# Patient Record
Sex: Female | Born: 1960 | Race: White | Hispanic: No | Smoking: Former smoker
Health system: Southern US, Community
[De-identification: ages and names within clinical notes are randomized; demographics above are authoritative.]

## PROBLEM LIST (undated history)

## (undated) DIAGNOSIS — N200 Calculus of kidney: Secondary | ICD-10-CM

## (undated) DIAGNOSIS — A630 Anogenital (venereal) warts: Secondary | ICD-10-CM

## (undated) DIAGNOSIS — R945 Abnormal results of liver function studies: Secondary | ICD-10-CM

## (undated) DIAGNOSIS — A419 Sepsis, unspecified organism: Secondary | ICD-10-CM

## (undated) DIAGNOSIS — F32A Depression, unspecified: Secondary | ICD-10-CM

## (undated) DIAGNOSIS — J90 Pleural effusion, not elsewhere classified: Secondary | ICD-10-CM

## (undated) DIAGNOSIS — Z87892 Personal history of anaphylaxis: Secondary | ICD-10-CM

## (undated) DIAGNOSIS — N1330 Unspecified hydronephrosis: Secondary | ICD-10-CM

## (undated) DIAGNOSIS — F329 Major depressive disorder, single episode, unspecified: Secondary | ICD-10-CM

## (undated) DIAGNOSIS — R011 Cardiac murmur, unspecified: Secondary | ICD-10-CM

## (undated) DIAGNOSIS — D649 Anemia, unspecified: Secondary | ICD-10-CM

## (undated) HISTORY — DX: Sepsis, unspecified organism: A41.9

## (undated) HISTORY — DX: Cardiac murmur, unspecified: R01.1

## (undated) HISTORY — DX: Depression, unspecified: F32.A

## (undated) HISTORY — DX: Anogenital (venereal) warts: A63.0

## (undated) HISTORY — DX: Major depressive disorder, single episode, unspecified: F32.9

---
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
## 1998-05-20 ENCOUNTER — Other Ambulatory Visit: Admission: RE | Admit: 1998-05-20 | Discharge: 1998-05-20 | Payer: Self-pay | Admitting: Obstetrics and Gynecology

## 1999-05-11 ENCOUNTER — Other Ambulatory Visit: Admission: RE | Admit: 1999-05-11 | Discharge: 1999-05-11 | Payer: Self-pay | Admitting: Obstetrics and Gynecology

## 2000-09-09 ENCOUNTER — Other Ambulatory Visit: Admission: RE | Admit: 2000-09-09 | Discharge: 2000-09-09 | Payer: Self-pay | Admitting: Obstetrics and Gynecology

## 2001-10-13 ENCOUNTER — Other Ambulatory Visit: Admission: RE | Admit: 2001-10-13 | Discharge: 2001-10-13 | Payer: Self-pay | Admitting: Obstetrics and Gynecology

## 2003-01-08 ENCOUNTER — Other Ambulatory Visit: Admission: RE | Admit: 2003-01-08 | Discharge: 2003-01-08 | Payer: Self-pay | Admitting: Obstetrics and Gynecology

## 2003-07-27 ENCOUNTER — Other Ambulatory Visit: Admission: RE | Admit: 2003-07-27 | Discharge: 2003-07-27 | Payer: Self-pay | Admitting: Obstetrics and Gynecology

## 2003-07-27 ENCOUNTER — Other Ambulatory Visit: Admission: RE | Admit: 2003-07-27 | Discharge: 2003-07-27 | Payer: Self-pay | Admitting: *Deleted

## 2004-01-27 ENCOUNTER — Other Ambulatory Visit: Admission: RE | Admit: 2004-01-27 | Discharge: 2004-01-27 | Payer: Self-pay | Admitting: Obstetrics and Gynecology

## 2004-03-22 ENCOUNTER — Ambulatory Visit (HOSPITAL_COMMUNITY): Admission: RE | Admit: 2004-03-22 | Discharge: 2004-03-22 | Payer: Self-pay | Admitting: Obstetrics and Gynecology

## 2011-07-26 ENCOUNTER — Other Ambulatory Visit: Payer: Self-pay | Admitting: Dermatology

## 2012-01-08 LAB — LIPID PANEL
Cholesterol: 241 — AB (ref 0–200)
HDL: 47 (ref 35–70)
LDL Cholesterol: 180
Triglycerides: 101 (ref 40–160)

## 2012-02-08 ENCOUNTER — Other Ambulatory Visit: Payer: Self-pay | Admitting: Gastroenterology

## 2014-10-13 ENCOUNTER — Encounter: Payer: Self-pay | Admitting: *Deleted

## 2017-11-21 ENCOUNTER — Emergency Department (HOSPITAL_COMMUNITY): Payer: BLUE CROSS/BLUE SHIELD

## 2017-11-21 ENCOUNTER — Emergency Department (HOSPITAL_COMMUNITY)
Admission: EM | Admit: 2017-11-21 | Discharge: 2017-11-21 | Disposition: A | Discharge status: Home / Self Care | Payer: BLUE CROSS/BLUE SHIELD | Attending: Emergency Medicine | Admitting: Emergency Medicine

## 2017-11-21 ENCOUNTER — Encounter (HOSPITAL_COMMUNITY): Payer: Self-pay | Admitting: Emergency Medicine

## 2017-11-21 DIAGNOSIS — N132 Hydronephrosis with renal and ureteral calculous obstruction: Secondary | ICD-10-CM | POA: Insufficient documentation

## 2017-11-21 DIAGNOSIS — N3001 Acute cystitis with hematuria: Secondary | ICD-10-CM | POA: Diagnosis not present

## 2017-11-21 DIAGNOSIS — R109 Unspecified abdominal pain: Secondary | ICD-10-CM | POA: Diagnosis present

## 2017-11-21 DIAGNOSIS — F17200 Nicotine dependence, unspecified, uncomplicated: Secondary | ICD-10-CM | POA: Insufficient documentation

## 2017-11-21 DIAGNOSIS — N200 Calculus of kidney: Secondary | ICD-10-CM

## 2017-11-21 HISTORY — DX: Calculus of kidney: N20.0

## 2017-11-21 LAB — URINALYSIS, ROUTINE W REFLEX MICROSCOPIC
BILIRUBIN URINE: NEGATIVE
Glucose, UA: NEGATIVE mg/dL
KETONES UR: 20 mg/dL — AB
Nitrite: POSITIVE — AB
Protein, ur: 30 mg/dL — AB
Specific Gravity, Urine: 1.018 (ref 1.005–1.030)
pH: 7 (ref 5.0–8.0)

## 2017-11-21 LAB — I-STAT CHEM 8, ED
BUN: 12 mg/dL (ref 6–20)
Calcium, Ion: 1.13 mmol/L — ABNORMAL LOW (ref 1.15–1.40)
Chloride: 105 mmol/L (ref 101–111)
Creatinine, Ser: 0.6 mg/dL (ref 0.44–1.00)
Glucose, Bld: 132 mg/dL — ABNORMAL HIGH (ref 65–99)
HEMATOCRIT: 42 % (ref 36.0–46.0)
HEMOGLOBIN: 14.3 g/dL (ref 12.0–15.0)
POTASSIUM: 3.7 mmol/L (ref 3.5–5.1)
SODIUM: 140 mmol/L (ref 135–145)
TCO2: 26 mmol/L (ref 22–32)

## 2017-11-21 LAB — CBC WITH DIFFERENTIAL/PLATELET
BASOS PCT: 0 %
Basophils Absolute: 0 10*3/uL (ref 0.0–0.1)
EOS ABS: 0 10*3/uL (ref 0.0–0.7)
Eosinophils Relative: 0 %
HCT: 40.7 % (ref 36.0–46.0)
HEMOGLOBIN: 14.1 g/dL (ref 12.0–15.0)
Lymphocytes Relative: 6 %
Lymphs Abs: 1 10*3/uL (ref 0.7–4.0)
MCH: 31.1 pg (ref 26.0–34.0)
MCHC: 34.6 g/dL (ref 30.0–36.0)
MCV: 89.8 fL (ref 78.0–100.0)
Monocytes Absolute: 0.2 10*3/uL (ref 0.1–1.0)
Monocytes Relative: 1 %
NEUTROS PCT: 93 %
Neutro Abs: 15.5 10*3/uL — ABNORMAL HIGH (ref 1.7–7.7)
PLATELETS: 287 10*3/uL (ref 150–400)
RBC: 4.53 MIL/uL (ref 3.87–5.11)
RDW: 13.6 % (ref 11.5–15.5)
WBC: 16.8 10*3/uL — AB (ref 4.0–10.5)

## 2017-11-21 MED ORDER — HYDROMORPHONE HCL 1 MG/ML IJ SOLN
1.0000 mg | Freq: Once | INTRAMUSCULAR | Status: AC
  Administered 2017-11-21: 1 mg via INTRAVENOUS
  Filled 2017-11-21: qty 1

## 2017-11-21 MED ORDER — METOCLOPRAMIDE HCL 5 MG/ML IJ SOLN
10.0000 mg | Freq: Once | INTRAMUSCULAR | Status: AC
  Administered 2017-11-21: 10 mg via INTRAVENOUS
  Filled 2017-11-21: qty 2

## 2017-11-21 MED ORDER — ONDANSETRON HCL 4 MG/2ML IJ SOLN
4.0000 mg | Freq: Once | INTRAMUSCULAR | Status: AC
Start: 2017-11-21 — End: 2017-11-21
  Administered 2017-11-21: 4 mg via INTRAVENOUS
  Filled 2017-11-21: qty 2

## 2017-11-21 MED ORDER — DEXTROSE 5 % IV SOLN
1.0000 g | Freq: Once | INTRAVENOUS | Status: AC
  Administered 2017-11-21: 1 g via INTRAVENOUS
  Filled 2017-11-21: qty 10

## 2017-11-21 MED ORDER — ONDANSETRON HCL 4 MG PO TABS: 4.0000 mg | ORAL_TABLET | Freq: Four times a day (QID) | ORAL | 0 refills | Status: DC

## 2017-11-21 MED ORDER — HYDROCODONE-ACETAMINOPHEN 5-325 MG PO TABS
1.0000 | ORAL_TABLET | Freq: Once | ORAL | Status: AC
  Administered 2017-11-21: 1 via ORAL
  Filled 2017-11-21: qty 1

## 2017-11-21 MED ORDER — HYDROCODONE-ACETAMINOPHEN 5-325 MG PO TABS: 2.0000 | ORAL_TABLET | ORAL | 0 refills | Status: DC | PRN

## 2017-11-21 MED ORDER — SODIUM CHLORIDE 0.9 % IV BOLUS (SEPSIS)
1000.0000 mL | Freq: Once | INTRAVENOUS | Status: AC
  Administered 2017-11-21: 1000 mL via INTRAVENOUS

## 2017-11-21 MED ORDER — CEPHALEXIN 500 MG PO CAPS: 500.0000 mg | ORAL_CAPSULE | Freq: Two times a day (BID) | ORAL | 0 refills | Status: DC

## 2017-11-21 MED ORDER — HYDROMORPHONE HCL 1 MG/ML IJ SOLN
0.5000 mg | Freq: Once | INTRAMUSCULAR | Status: AC
  Administered 2017-11-21: 0.5 mg via INTRAVENOUS
  Filled 2017-11-21: qty 1

## 2017-11-21 NOTE — ED Provider Notes (Signed)
[facility]   CSN: [id number] Arrival date & time: 11/21/17  1029     History   Chief Complaint Chief Complaint  Patient presents with  . Flank Pain    HPI Sarah Fry is a 56 y.o. female.  The history is provided by the patient.  Flank Pain  This is a new problem. The current episode started 3 to 5 hours ago. The problem occurs constantly. The problem has been rapidly worsening. Associated symptoms comments: Severe searing 10 out of 10 pain that started as a dull ache in the left side of her back and moved around to her left lower abdomen.  Nausea without vomiting.  No fever.  Patient felt fine this morning before the pain started.. Nothing aggravates the symptoms. Nothing relieves the symptoms. Treatments tried: 4 ibuprofen. The treatment provided no relief.    Past Medical History:  Diagnosis Date  . Kidney stones     There are no active problems to display for this patient.   History reviewed. No pertinent surgical history.  OB History    No data available       Home Medications    Prior to Admission medications   Not on File    Family History History reviewed. No pertinent family history.  Social History Social History   Tobacco Use  . Smoking status: Current Every Day Smoker  Substance Use Topics  . Alcohol use: No  . Drug use: No     Allergies   Patient has no known allergies.   Review of Systems Review of Systems  Genitourinary: Positive for flank pain.  All other systems reviewed and are negative.    Physical Exam Updated Vital Signs BP 135/87 (BP Location: Right Arm)   Pulse 83   Temp 98 F (36.7 C)   Resp 17   SpO2 98%   Physical Exam  Constitutional: She is oriented to person, place, and time. She appears well-developed and well-nourished. She appears distressed.  Appears very uncomfortable, curled in the fetal position on the bed and moaning  HENT:  Head:  Normocephalic and atraumatic.  Mouth/Throat: Oropharynx is clear and moist.  Eyes: Conjunctivae and EOM are normal. Pupils are equal, round, and reactive to light.  Neck: Normal range of motion. Neck supple.  Cardiovascular: Normal rate, regular rhythm and intact distal pulses.  No murmur heard. Pulmonary/Chest: Effort normal and breath sounds normal. No respiratory distress. She has no wheezes. She has no rales.  Abdominal: Soft. She exhibits no distension. There is tenderness in the left lower quadrant. There is CVA tenderness. There is no rebound and no guarding.  Musculoskeletal: Normal range of motion. She exhibits no edema or tenderness.  Neurological: She is alert and oriented to person, place, and time.  Skin: Skin is warm and dry. No rash noted. No erythema.  Psychiatric: She has a normal mood and affect. Her behavior is normal.  Nursing note and vitals reviewed.    ED Treatments / Results  Labs (all labs ordered are listed, but only abnormal results are displayed) Labs Reviewed  URINALYSIS, ROUTINE W REFLEX MICROSCOPIC - Abnormal; Notable for the following components:      Result Value   Color, Urine AMBER (*)    APPearance CLOUDY (*)    Hgb urine dipstick LARGE (*)    Ketones, ur 20 (*)    Protein, ur 30 (*)    Nitrite POSITIVE (*)    Leukocytes, UA SMALL (*)  Bacteria, UA FEW (*)    Squamous Epithelial / LPF 0-5 (*)    All other components within normal limits  CBC WITH DIFFERENTIAL/PLATELET - Abnormal; Notable for the following components:   WBC 16.8 (*)    Neutro Abs 15.5 (*)    All other components within normal limits  I-STAT CHEM 8, ED - Abnormal; Notable for the following components:   Glucose, Bld 132 (*)    Calcium, Ion 1.13 (*)    All other components within normal limits  URINE CULTURE    EKG  EKG Interpretation None       Radiology Ct Renal Stone Study  Result Date: 11/21/2017 CLINICAL DATA:  Left flank pain radiating to left lower  quadrant since this morning. History of kidney stones. EXAM: CT ABDOMEN AND PELVIS WITHOUT CONTRAST TECHNIQUE: Multidetector CT imaging of the abdomen and pelvis was performed following the standard protocol without IV contrast. COMPARISON:  CT abdomen pelvis dated March 23, 2011. FINDINGS: Lower chest: No acute abnormality. Hepatobiliary: No focal liver abnormality is seen. No gallstones, gallbladder wall thickening, or biliary dilatation. Pancreas: Unremarkable. No pancreatic ductal dilatation or surrounding inflammatory changes. Spleen: Normal in size without focal abnormality. Adrenals/Urinary Tract: The adrenal glands are unremarkable. There is a 4 mm calculus in the distal left ureter, approximately 1.5 cm from the UVJ. There is resultant moderate left hydroureteronephrosis. There are additional nonobstructive calculi in both kidneys. No right hydronephrosis. Simple cyst in the lower pole of the right kidney. The bladder is decompressed. Stomach/Bowel: Stomach is within normal limits. Appendix appears normal. No evidence of bowel wall thickening, distention, or inflammatory changes. Fatty changes within the wall of the cecum and ascending colon may be related to chronic inflammation. Vascular/Lymphatic: Aortic atherosclerosis. No enlarged abdominal or pelvic lymph nodes. Reproductive: Uterus and bilateral adnexa are unremarkable. Other: No free fluid or pneumoperitoneum. Musculoskeletal: No acute or significant osseous findings. 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 Moderate degenerative disc disease at L4-L5. IMPRESSION: 1. 4 mm calculus in the distal left ureter approximately 1.5 cm from the UVJ with resultant moderate left hydroureteronephrosis. 2. Additional nonobstructive bilateral renal calculi. 3.  Aortic atherosclerosis (ICD10-I70.0). Electronically Signed   By: Obie DredgeWilliam T Derry M.D.   On: 11/21/2017 12:34    Procedures Procedures (including critical care time)  Medications  Ordered in ED Medications  HYDROmorphone (DILAUDID) injection 1 mg (not administered)  ondansetron (ZOFRAN) injection 4 mg (not administered)  sodium chloride 0.9 % bolus 1,000 mL (not administered)     Initial Impression / Assessment and Plan / ED Course  I have reviewed the triage vital signs and the nursing notes.  Pertinent labs & imaging results that were available during my care of the patient were reviewed by me and considered in my medical decision making (see chart for details).     Pt with symptoms consistent with kidney stone.  Denies infectious sx, or GI symptoms.  Low concern for diverticulitis and no risk factors or history suggestive of AAA.  No hx suggestive of GU source (discharge) and otherwise pt is healthy.  Will hydrate, treat pain and ensure no infection with UA, CBC, BMP and will get stone study to further eval.  1:04 PM Patient found to have normal renal function, CT with a 4 mm distal left stone with hydronephrosis.  Leukocytosis of 16,000 and UA with positive nitrites, positive leukocytes and too numerous to count white blood cells and red cells with few bacteria.  No evidence of contamination.  On  repeat exam patient's pain had improved but prior to leaving the room the pain was starting to return and patient was dry heaving.  Will discuss case with urology.  1:53 PM Dr. Mena GoesEskridge recommended IV Rocephin and ongoing monitoring.  If patient continues to have more nausea and vomiting, develops a fever or other acute findings she may need admission for treatment for pyelonephritis.  But he would not do any type of acute intervention at this time.  Discussed this with the patient.  She states she is no longer nauseated and her pain is still controlled.  Patient is afebrile at 98.2.  We will continue to monitor and give IV antibiotics.  3:31 PM On repeat check patient is feeling better.  Pain level of a 2 and no nausea.  Patient has been afebrile here.  Heart rate is 100.   Discussed with urology and at this time feel patient is okay for discharge but given strict return precautions.   Final Clinical Impressions(s) / ED Diagnoses   Final diagnoses:  Kidney stone on left side  Acute cystitis with hematuria    ED Discharge Orders        Ordered    ondansetron (ZOFRAN) 4 MG tablet  Every 6 hours     11/21/17 1532    HYDROcodone-acetaminophen (NORCO/VICODIN) 5-325 MG tablet  Every 4 hours PRN     11/21/17 1532    cephALEXin (KEFLEX) 500 MG capsule  2 times daily     11/21/17 1532       Gwyneth SproutPlunkett, Doug Bucklin, MD 11/21/17 1534

## 2017-11-21 NOTE — Discharge Instructions (Signed)
You can take the pain medication every 4-6 hours 1-2 pills as needed for pain.  You can also take Aleve twice a day or 3 ibuprofen(600mg ) every 6 hours in addition to the pain medication for pain control.  If you develop fever at home or persistent vomiting you need to return to the hospital for IV antibiotics and further care.

## 2017-11-21 NOTE — ED Triage Notes (Signed)
Pt reports L flank pain pain radiating around to LLQ since this am accompanied by nausea and chills. Hx of kidney stones. No diarrhea.

## 2017-11-21 NOTE — ED Notes (Signed)
Pt. Woke up this morning around 6:30 with low grade pain (1/10) between 8:ooam-9:00am pt began to have severe pain (10/10).

## 2017-11-22 ENCOUNTER — Emergency Department (HOSPITAL_COMMUNITY)
Admission: EM | Admit: 2017-11-22 | Discharge: 2017-11-23 | Disposition: A | Discharge status: Home / Self Care | Payer: BLUE CROSS/BLUE SHIELD | Attending: Emergency Medicine | Admitting: Emergency Medicine

## 2017-11-22 ENCOUNTER — Other Ambulatory Visit: Payer: Self-pay

## 2017-11-22 DIAGNOSIS — F17200 Nicotine dependence, unspecified, uncomplicated: Secondary | ICD-10-CM | POA: Diagnosis not present

## 2017-11-22 DIAGNOSIS — T782XXA Anaphylactic shock, unspecified, initial encounter: Secondary | ICD-10-CM | POA: Insufficient documentation

## 2017-11-22 DIAGNOSIS — R0602 Shortness of breath: Secondary | ICD-10-CM | POA: Diagnosis present

## 2017-11-22 MED ORDER — SODIUM CHLORIDE 0.9 % IV BOLUS (SEPSIS)
1000.0000 mL | Freq: Once | INTRAVENOUS | Status: AC
  Administered 2017-11-22: 1000 mL via INTRAVENOUS

## 2017-11-22 MED ORDER — EPINEPHRINE 0.3 MG/0.3ML IJ SOAJ
INTRAMUSCULAR | Status: AC
  Administered 2017-11-22: 0.3 mg
  Filled 2017-11-22: qty 0.3

## 2017-11-22 MED ORDER — METHYLPREDNISOLONE SODIUM SUCC 125 MG IJ SOLR
125.0000 mg | Freq: Once | INTRAMUSCULAR | Status: AC
  Administered 2017-11-22: 125 mg via INTRAVENOUS
  Filled 2017-11-22: qty 2

## 2017-11-22 MED ORDER — DIPHENHYDRAMINE HCL 50 MG/ML IJ SOLN
25.0000 mg | Freq: Once | INTRAMUSCULAR | Status: AC
  Administered 2017-11-22: 25 mg via INTRAVENOUS
  Filled 2017-11-22: qty 1

## 2017-11-22 MED ORDER — FAMOTIDINE IN NACL 20-0.9 MG/50ML-% IV SOLN
20.0000 mg | Freq: Once | INTRAVENOUS | Status: AC
  Administered 2017-11-22: 20 mg via INTRAVENOUS
  Filled 2017-11-22: qty 50

## 2017-11-22 MED ORDER — LIDOCAINE VISCOUS 2 % MT SOLN
15.0000 mL | Freq: Once | OROMUCOSAL | Status: AC
  Administered 2017-11-22: 15 mL via OROMUCOSAL
  Filled 2017-11-22: qty 15

## 2017-11-22 NOTE — ED Notes (Signed)
Pt stated that her throat is hurting and it is painful to swallow. MD made aware.

## 2017-11-22 NOTE — ED Triage Notes (Addendum)
Pt from home via POV for kidney stone. Was prescribed zofran, cephlexin, hydrocodone. Pt presents today with throat closing up, SOB, and urticaria. N/V today around 1400.

## 2017-11-22 NOTE — ED Provider Notes (Addendum)
[facility] Provider Note   CSN: [id number] Arrival date & time: 11/22/17  1840     History   Chief Complaint Chief Complaint  Patient presents with  . Allergic Reaction    HPI Sarah Fry is a 56 y.o. female.  HPI   56 year old female with history of nephrolithiasis diagnosed yesterday, presents with concern for possible allergic reaction.  Reports that earlier this morning, she noted intermittent pruritus of her chest, and about 4 hours ago, developed nausea and vomiting.  2 hours ago, developed shortness of breath and sensation of throat swelling.  Denies significant abdominal pain or diarrhea.  Denies significant dysphonia or difficulty swallowing.  Reports a sensation that her throat is swollen.  Denies any history of anaphylaxis or prior allergic reactions.  Reports that she last took antibiotic, cephalexin this morning, ibuprofen this afternoon around 3 PM, and that just prior to developing the shortness of breath and sensation of throat closing, she had a boost shake for the first time.     Past Medical History:  Diagnosis Date  . Kidney stones     There are no active problems to display for this patient.   No past surgical history on file.  OB History    No data available       Home Medications    Prior to Admission medications   Medication Sig Start Date End Date Taking? Authorizing Provider  EPINEPHrine 0.3 mg/0.3 mL IJ SOAJ injection Inject 0.3 mLs (0.3 mg total) into the muscle once for 1 dose. 11/23/17 11/23/17  Alvira MondaySchlossman, Kyngston Pickelsimer, MD  predniSONE (DELTASONE) 10 MG tablet Take 3 tablets (30 mg total) by mouth daily for 3 days. 11/22/17 11/25/17  Alvira MondaySchlossman, Arieana Somoza, MD  sulfamethoxazole-trimethoprim (BACTRIM DS,SEPTRA DS) 800-160 MG tablet Take 1 tablet by mouth 2 (two) times daily for 6 days. 11/23/17 11/29/17  Alvira MondaySchlossman, Indie Boehne, MD    Family History No family history on file.  Social History Social History    Tobacco Use  . Smoking status: Current Every Day Smoker  Substance Use Topics  . Alcohol use: No  . Drug use: No     Allergies   Cephalosporins   Review of Systems Review of Systems  Constitutional: Negative for fever.  HENT: Positive for sore throat (feels like throat swelling). Negative for trouble swallowing and voice change.   Eyes: Negative for visual disturbance.  Respiratory: Positive for shortness of breath. Negative for cough.   Cardiovascular: Negative for chest pain.  Gastrointestinal: Positive for nausea and vomiting. Negative for abdominal pain and diarrhea.  Genitourinary: Negative for difficulty urinating and dysuria.  Musculoskeletal: Negative for back pain and neck pain.  Skin: Negative for rash (no rash but has had itching).  Neurological: Negative for syncope and headaches.     Physical Exam Updated Vital Signs BP 114/65   Pulse 94   Temp 97.7 F (36.5 C) (Oral)   Resp 18   SpO2 90%   Physical Exam  Constitutional: She is oriented to person, place, and time. She appears well-developed and well-nourished. No distress.  HENT:  Head: Normocephalic and atraumatic.  No sign of oral or pharyngeal swelling visualized  Eyes: Conjunctivae and EOM are normal.  Neck: Normal range of motion.  Cardiovascular: Normal rate, regular rhythm, normal heart sounds and intact distal pulses. Exam reveals no gallop and no friction rub.  No murmur heard. Pulmonary/Chest: Effort normal and breath sounds normal. No respiratory distress. She has no wheezes. She has no rales.  Mild stridor listening over neck with stethoscope  Abdominal: Soft. She exhibits no distension. There is no tenderness. There is no guarding.  Musculoskeletal: She exhibits no edema or tenderness.  Neurological: She is alert and oriented to person, place, and time.  Skin: Skin is warm and dry. No rash noted. She is not diaphoretic. No erythema.  Nursing note and vitals reviewed.    ED  Treatments / Results  Labs (all labs ordered are listed, but only abnormal results are displayed) Labs Reviewed - No data to display  EKG  EKG Interpretation  Date/Time:  Friday November 22 2017 18:45:56 EST Ventricular Rate:  92 PR Interval:    QRS Duration: 83 QT Interval:  352 QTC Calculation: 436 R Axis:   -67 Text Interpretation:  Sinus rhythm Probable left atrial enlargement LAD, consider left anterior fascicular block Baseline wander in lead(s) III No previous ECGs available Confirmed by Alvira Monday ([id number]) on 11/22/2017 7:56:28 PM       Radiology Ct Renal Stone Study  Result Date: 11/21/2017 CLINICAL DATA:  Left flank pain radiating to left lower quadrant since this morning. History of kidney stones. EXAM: CT ABDOMEN AND PELVIS WITHOUT CONTRAST TECHNIQUE: Multidetector CT imaging of the abdomen and pelvis was performed following the standard protocol without IV contrast. COMPARISON:  CT abdomen pelvis dated March 23, 2011. FINDINGS: Lower chest: No acute abnormality. Hepatobiliary: No focal liver abnormality is seen. No gallstones, gallbladder wall thickening, or biliary dilatation. Pancreas: Unremarkable. No pancreatic ductal dilatation or surrounding inflammatory changes. Spleen: Normal in size without focal abnormality. Adrenals/Urinary Tract: The adrenal glands are unremarkable. There is a 4 mm calculus in the distal left ureter, approximately 1.5 cm from the UVJ. There is resultant moderate left hydroureteronephrosis. There are additional nonobstructive calculi in both kidneys. No right hydronephrosis. Simple cyst in the lower pole of the right kidney. The bladder is decompressed. Stomach/Bowel: Stomach is within normal limits. Appendix appears normal. No evidence of bowel wall thickening, distention, or inflammatory changes. Fatty changes within the wall of the cecum and ascending colon may be related to chronic inflammation. Vascular/Lymphatic: Aortic atherosclerosis. No  enlarged abdominal or pelvic lymph nodes. Reproductive: Uterus and bilateral adnexa are unremarkable. Other: No free fluid or pneumoperitoneum. Musculoskeletal: No acute or significant osseous findings. 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 Moderate degenerative disc disease at L4-L5. IMPRESSION: 1. 4 mm calculus in the distal left ureter approximately 1.5 cm from the UVJ with resultant moderate left hydroureteronephrosis. 2. Additional nonobstructive bilateral renal calculi. 3.  Aortic atherosclerosis (ICD10-I70.0). Electronically Signed   By: Obie Dredge M.D.   On: 11/21/2017 12:34    Procedures .Critical Care Performed by: Alvira Monday, MD Authorized by: Alvira Monday, MD   Comments:     CRITICAL CARE: anaphylaxis Performed by: Lynnea Ferrier   Total critical care time: 30 minutes  Critical care time was exclusive of separately billable procedures and treating other patients.  Critical care was necessary to treat or prevent imminent or life-threatening deterioration.  Critical care was time spent personally by me on the following activities: development of treatment plan with patient and/or surrogate as well as nursing, discussions with consultants, evaluation of patient's response to treatment, examination of patient, obtaining history from patient or surrogate, ordering and performing treatments and interventions, ordering and review of laboratory studies, ordering and review of radiographic studies, pulse oximetry and re-evaluation of patient's condition.    (including critical care time)  Medications Ordered in ED Medications  EPINEPHrine (EPI-PEN) 0.3  mg/0.3 mL injection (0.3 mg  Given 11/22/17 1856)  sodium chloride 0.9 % bolus 1,000 mL (0 mLs Intravenous Stopped 11/22/17 2050)  methylPREDNISolone sodium succinate (SOLU-MEDROL) 125 mg/2 mL injection 125 mg (125 mg Intravenous Given 11/22/17 1922)  diphenhydrAMINE (BENADRYL) injection 25 mg (25  mg Intravenous Given 11/22/17 1921)  famotidine (PEPCID) IVPB 20 mg premix (0 mg Intravenous Stopped 11/22/17 1952)  lidocaine (XYLOCAINE) 2 % viscous mouth solution 15 mL (15 mLs Mouth/Throat Given 11/22/17 2035)     Initial Impression / Assessment and Plan / ED Course  I have reviewed the triage vital signs and the nursing notes.  Pertinent labs & imaging results that were available during my care of the patient were reviewed by me and considered in my medical decision making (see chart for details).    56 year old female with history of nephrolithiasis diagnosed yesterday, presents with concern for possible allergic reaction.  Given concern for pruritus, nausea, vomiting, with development of shortness of breath and sensation of for anaphylaxis.  Unclear trigger by history, with possibilities including both medications patient had taken earlier today, as well as the boost shakes she had just immediately prior to reaction.  Patient was given IM epinephrine, Solu-Medrol, Benadryl and pepcid.  Reported improvement in symptoms, did report sore throat, was given lidocaine that did not initially help but prior to discharge reports no throat swelling, no shortness of breath, no n/v/diarrhea, no sore throat.  Exam improved.  She was observed in the ED for 5 hours.  Recommend allergist follow up.  Unclear if cephalosporin allergy or other, possible boost shake, possible hydrocodone, zofran, or ibuprofen.  Discussed would avoid all of these and follow up with allergist.  Gave rx for bactrim for UTI diagnosed yesterday. No longer having flank pain and doubt persistent nephrolithiasis.  Gave rx for epinephrine, prednisone, rec benadryl, pepcid.   Final Clinical Impressions(s) / ED Diagnoses   Final diagnoses:  Anaphylaxis, initial encounter    ED Discharge Orders        Ordered    EPINEPHrine 0.3 mg/0.3 mL IJ SOAJ injection   Once     11/23/17 0000    sulfamethoxazole-trimethoprim (BACTRIM DS,SEPTRA  DS) 800-160 MG tablet  2 times daily     11/23/17 0001    predniSONE (DELTASONE) 10 MG tablet  Daily     11/23/17 0000       Alvira MondaySchlossman, Alena Blankenbeckler, MD 11/23/17 [id number]    Alvira MondaySchlossman, Dominic Mahaney, MD 11/23/17 [id number]

## 2017-11-23 MED ORDER — PREDNISONE 10 MG PO TABS: 30.0000 mg | ORAL_TABLET | Freq: Every day | ORAL | 0 refills | Status: AC

## 2017-11-23 MED ORDER — EPINEPHRINE 0.3 MG/0.3ML IJ SOAJ: 0.3000 mg | Freq: Once | INTRAMUSCULAR | 1 refills | Status: AC

## 2017-11-23 MED ORDER — SULFAMETHOXAZOLE-TRIMETHOPRIM 800-160 MG PO TABS: 1.0000 | ORAL_TABLET | Freq: Two times a day (BID) | ORAL | 0 refills | Status: AC

## 2017-11-23 NOTE — Discharge Instructions (Signed)
Unclear allergy at this time given multiple new exposures.  Would discuss this episode before someone prescribes you cephalosporins in the future.

## 2017-11-24 LAB — URINE CULTURE: Culture: >=100000 — AB

## 2017-11-25 ENCOUNTER — Telehealth: Payer: Self-pay | Admitting: Emergency Medicine

## 2017-11-25 NOTE — Telephone Encounter (Signed)
Post ED Visit - Positive Culture Follow-up  Culture report reviewed by antimicrobial stewardship pharmacist:  []  Enzo BiNathan Batchelder, Pharm.D. []  Celedonio MiyamotoJeremy Frens, Pharm.D., BCPS AQ-ID []  Garvin FilaMike Maccia, Pharm.D., BCPS []  Georgina PillionElizabeth Martin, Pharm.D., BCPS []  CloquetMinh Pham, VermontPharm.D., BCPS, AAHIVP []  Estella HuskMichelle Turner, Pharm.D., BCPS, AAHIVP []  Lysle Pearlachel Rumbarger, PharmD, BCPS []  Casilda Carlsaylor Stone, PharmD, BCPS []  Pollyann SamplesAndy Johnston, PharmD, BCPS Dellie Catholiciana Raymond PharmD  Positive urine culture Treated with cephalexin, organism sensitive to the same and no further patient follow-up is required at this time.  Berle MullMiller, Gissella Niblack 11/25/2017, 10:26 AM

## 2018-12-31 DIAGNOSIS — A419 Sepsis, unspecified organism: Secondary | ICD-10-CM

## 2018-12-31 HISTORY — DX: Sepsis, unspecified organism: A41.9

## 2019-02-02 ENCOUNTER — Other Ambulatory Visit: Payer: Self-pay | Admitting: Oncology

## 2019-02-02 ENCOUNTER — Encounter: Payer: Self-pay | Admitting: Emergency Medicine

## 2019-02-02 ENCOUNTER — Other Ambulatory Visit: Payer: Self-pay

## 2019-02-02 ENCOUNTER — Inpatient Hospital Stay (HOSPITAL_COMMUNITY)
Admission: EM | Admit: 2019-02-02 | Discharge: 2019-02-08 | DRG: 872 | Disposition: A | Discharge status: Home / Self Care | Payer: 59 | Attending: Internal Medicine | Admitting: Internal Medicine

## 2019-02-02 ENCOUNTER — Ambulatory Visit (INDEPENDENT_AMBULATORY_CARE_PROVIDER_SITE_OTHER): Payer: 59 | Admitting: Emergency Medicine

## 2019-02-02 ENCOUNTER — Telehealth: Payer: Self-pay | Admitting: Emergency Medicine

## 2019-02-02 ENCOUNTER — Emergency Department (HOSPITAL_COMMUNITY): Payer: 59

## 2019-02-02 ENCOUNTER — Inpatient Hospital Stay (HOSPITAL_COMMUNITY): Payer: 59

## 2019-02-02 ENCOUNTER — Encounter (HOSPITAL_COMMUNITY): Payer: Self-pay | Admitting: *Deleted

## 2019-02-02 ENCOUNTER — Encounter (HOSPITAL_COMMUNITY): Payer: Self-pay | Admitting: Emergency Medicine

## 2019-02-02 VITALS — BP 135/89 | HR 134 | Temp 97.8°F | Resp 16

## 2019-02-02 DIAGNOSIS — L040 Acute lymphadenitis of face, head and neck: Secondary | ICD-10-CM | POA: Diagnosis not present

## 2019-02-02 DIAGNOSIS — J9601 Acute respiratory failure with hypoxia: Secondary | ICD-10-CM | POA: Diagnosis not present

## 2019-02-02 DIAGNOSIS — K250 Acute gastric ulcer with hemorrhage: Secondary | ICD-10-CM | POA: Diagnosis not present

## 2019-02-02 DIAGNOSIS — Z79899 Other long term (current) drug therapy: Secondary | ICD-10-CM

## 2019-02-02 DIAGNOSIS — S00532A Contusion of oral cavity, initial encounter: Secondary | ICD-10-CM | POA: Diagnosis present

## 2019-02-02 DIAGNOSIS — E871 Hypo-osmolality and hyponatremia: Secondary | ICD-10-CM | POA: Diagnosis not present

## 2019-02-02 DIAGNOSIS — N3289 Other specified disorders of bladder: Secondary | ICD-10-CM | POA: Diagnosis present

## 2019-02-02 DIAGNOSIS — F1290 Cannabis use, unspecified, uncomplicated: Secondary | ICD-10-CM | POA: Diagnosis not present

## 2019-02-02 DIAGNOSIS — K2980 Duodenitis without bleeding: Secondary | ICD-10-CM | POA: Diagnosis not present

## 2019-02-02 DIAGNOSIS — T782XXA Anaphylactic shock, unspecified, initial encounter: Secondary | ICD-10-CM | POA: Diagnosis not present

## 2019-02-02 DIAGNOSIS — E860 Dehydration: Secondary | ICD-10-CM | POA: Diagnosis present

## 2019-02-02 DIAGNOSIS — G9389 Other specified disorders of brain: Secondary | ICD-10-CM | POA: Diagnosis not present

## 2019-02-02 DIAGNOSIS — K2210 Ulcer of esophagus without bleeding: Secondary | ICD-10-CM | POA: Diagnosis present

## 2019-02-02 DIAGNOSIS — K259 Gastric ulcer, unspecified as acute or chronic, without hemorrhage or perforation: Secondary | ICD-10-CM | POA: Diagnosis present

## 2019-02-02 DIAGNOSIS — D6959 Other secondary thrombocytopenia: Secondary | ICD-10-CM | POA: Diagnosis present

## 2019-02-02 DIAGNOSIS — K269 Duodenal ulcer, unspecified as acute or chronic, without hemorrhage or perforation: Secondary | ICD-10-CM | POA: Diagnosis not present

## 2019-02-02 DIAGNOSIS — R933 Abnormal findings on diagnostic imaging of other parts of digestive tract: Secondary | ICD-10-CM | POA: Diagnosis not present

## 2019-02-02 DIAGNOSIS — A419 Sepsis, unspecified organism: Secondary | ICD-10-CM | POA: Diagnosis not present

## 2019-02-02 DIAGNOSIS — N179 Acute kidney failure, unspecified: Secondary | ICD-10-CM | POA: Diagnosis present

## 2019-02-02 DIAGNOSIS — F17200 Nicotine dependence, unspecified, uncomplicated: Secondary | ICD-10-CM | POA: Diagnosis not present

## 2019-02-02 DIAGNOSIS — Z791 Long term (current) use of non-steroidal anti-inflammatories (NSAID): Secondary | ICD-10-CM

## 2019-02-02 DIAGNOSIS — R0602 Shortness of breath: Secondary | ICD-10-CM | POA: Diagnosis not present

## 2019-02-02 DIAGNOSIS — A4159 Other Gram-negative sepsis: Principal | ICD-10-CM | POA: Diagnosis present

## 2019-02-02 DIAGNOSIS — K228 Other specified diseases of esophagus: Secondary | ICD-10-CM | POA: Diagnosis not present

## 2019-02-02 DIAGNOSIS — E872 Acidosis: Secondary | ICD-10-CM | POA: Diagnosis not present

## 2019-02-02 DIAGNOSIS — R000 Tachycardia, unspecified: Secondary | ICD-10-CM | POA: Diagnosis not present

## 2019-02-02 DIAGNOSIS — T783XXS Angioneurotic edema, sequela: Secondary | ICD-10-CM

## 2019-02-02 DIAGNOSIS — J9600 Acute respiratory failure, unspecified whether with hypoxia or hypercapnia: Secondary | ICD-10-CM | POA: Diagnosis not present

## 2019-02-02 DIAGNOSIS — E876 Hypokalemia: Secondary | ICD-10-CM | POA: Diagnosis not present

## 2019-02-02 DIAGNOSIS — K529 Noninfective gastroenteritis and colitis, unspecified: Secondary | ICD-10-CM | POA: Diagnosis present

## 2019-02-02 DIAGNOSIS — K208 Other esophagitis: Secondary | ICD-10-CM | POA: Diagnosis not present

## 2019-02-02 DIAGNOSIS — R768 Other specified abnormal immunological findings in serum: Secondary | ICD-10-CM | POA: Diagnosis present

## 2019-02-02 DIAGNOSIS — M7989 Other specified soft tissue disorders: Secondary | ICD-10-CM | POA: Diagnosis not present

## 2019-02-02 DIAGNOSIS — D696 Thrombocytopenia, unspecified: Secondary | ICD-10-CM

## 2019-02-02 DIAGNOSIS — R911 Solitary pulmonary nodule: Secondary | ICD-10-CM | POA: Diagnosis not present

## 2019-02-02 DIAGNOSIS — J9811 Atelectasis: Secondary | ICD-10-CM | POA: Diagnosis present

## 2019-02-02 DIAGNOSIS — Z87442 Personal history of urinary calculi: Secondary | ICD-10-CM

## 2019-02-02 DIAGNOSIS — F17210 Nicotine dependence, cigarettes, uncomplicated: Secondary | ICD-10-CM | POA: Diagnosis present

## 2019-02-02 DIAGNOSIS — R1084 Generalized abdominal pain: Secondary | ICD-10-CM | POA: Clinically undetermined

## 2019-02-02 DIAGNOSIS — T783XXD Angioneurotic edema, subsequent encounter: Secondary | ICD-10-CM | POA: Diagnosis not present

## 2019-02-02 DIAGNOSIS — Z8679 Personal history of other diseases of the circulatory system: Secondary | ICD-10-CM

## 2019-02-02 DIAGNOSIS — T783XXA Angioneurotic edema, initial encounter: Secondary | ICD-10-CM | POA: Diagnosis not present

## 2019-02-02 DIAGNOSIS — K140 Glossitis: Secondary | ICD-10-CM | POA: Diagnosis not present

## 2019-02-02 DIAGNOSIS — I871 Compression of vein: Secondary | ICD-10-CM | POA: Diagnosis present

## 2019-02-02 DIAGNOSIS — N2889 Other specified disorders of kidney and ureter: Secondary | ICD-10-CM

## 2019-02-02 DIAGNOSIS — R809 Proteinuria, unspecified: Secondary | ICD-10-CM | POA: Diagnosis not present

## 2019-02-02 DIAGNOSIS — K210 Gastro-esophageal reflux disease with esophagitis: Secondary | ICD-10-CM | POA: Diagnosis not present

## 2019-02-02 DIAGNOSIS — T7840XA Allergy, unspecified, initial encounter: Secondary | ICD-10-CM | POA: Diagnosis not present

## 2019-02-02 DIAGNOSIS — Z888 Allergy status to other drugs, medicaments and biological substances status: Secondary | ICD-10-CM

## 2019-02-02 DIAGNOSIS — N1370 Vesicoureteral-reflux, unspecified: Secondary | ICD-10-CM | POA: Diagnosis not present

## 2019-02-02 DIAGNOSIS — R7989 Other specified abnormal findings of blood chemistry: Secondary | ICD-10-CM | POA: Diagnosis not present

## 2019-02-02 DIAGNOSIS — H5789 Other specified disorders of eye and adnexa: Secondary | ICD-10-CM | POA: Diagnosis not present

## 2019-02-02 DIAGNOSIS — Z8719 Personal history of other diseases of the digestive system: Secondary | ICD-10-CM

## 2019-02-02 DIAGNOSIS — I361 Nonrheumatic tricuspid (valve) insufficiency: Secondary | ICD-10-CM | POA: Diagnosis not present

## 2019-02-02 DIAGNOSIS — M6282 Rhabdomyolysis: Secondary | ICD-10-CM | POA: Diagnosis present

## 2019-02-02 LAB — COMPREHENSIVE METABOLIC PANEL
ALBUMIN: 3.4 g/dL — AB (ref 3.5–5.0)
ALK PHOS: 90 U/L (ref 38–126)
ALT: 29 U/L (ref 0–44)
AST: 48 U/L — AB (ref 15–41)
Anion gap: 17 — ABNORMAL HIGH (ref 5–15)
BILIRUBIN TOTAL: 0.5 mg/dL (ref 0.3–1.2)
BUN: 42 mg/dL — AB (ref 6–20)
CO2: 14 mmol/L — ABNORMAL LOW (ref 22–32)
CREATININE: 2.03 mg/dL — AB (ref 0.44–1.00)
Calcium: 8.3 mg/dL — ABNORMAL LOW (ref 8.9–10.3)
Chloride: 102 mmol/L (ref 98–111)
GFR calc Af Amer: 31 mL/min — ABNORMAL LOW (ref >60)
GFR calc non Af Amer: 27 mL/min — ABNORMAL LOW (ref >60)
GLUCOSE: 137 mg/dL — AB (ref 70–99)
POTASSIUM: 3.2 mmol/L — AB (ref 3.5–5.1)
Sodium: 133 mmol/L — ABNORMAL LOW (ref 135–145)
TOTAL PROTEIN: 6.9 g/dL (ref 6.5–8.1)

## 2019-02-02 LAB — POCT I-STAT EG7
Acid-base deficit: 11 mmol/L — ABNORMAL HIGH (ref 0.0–2.0)
Bicarbonate: 14.2 mmol/L — ABNORMAL LOW (ref 20.0–28.0)
Calcium, Ion: 1 mmol/L — ABNORMAL LOW (ref 1.15–1.40)
HCT: 50 % — ABNORMAL HIGH (ref 36.0–46.0)
Hemoglobin: 17 g/dL — ABNORMAL HIGH (ref 12.0–15.0)
O2 Saturation: 78 %
PCO2 VEN: 29.4 mmHg — AB (ref 44.0–60.0)
Potassium: 3.1 mmol/L — ABNORMAL LOW (ref 3.5–5.1)
Sodium: 131 mmol/L — ABNORMAL LOW (ref 135–145)
TCO2: 15 mmol/L — ABNORMAL LOW (ref 22–32)
pH, Ven: 7.293 (ref 7.250–7.430)
pO2, Ven: 46 mmHg — ABNORMAL HIGH (ref 32.0–45.0)

## 2019-02-02 LAB — SODIUM, URINE, RANDOM: Sodium, Ur: 56 mmol/L

## 2019-02-02 LAB — CBC WITH DIFFERENTIAL/PLATELET
Abs Immature Granulocytes: 0 10*3/uL (ref 0.00–0.07)
Basophils Absolute: 0 10*3/uL (ref 0.0–0.1)
Basophils Relative: 0 %
EOS ABS: 0 10*3/uL (ref 0.0–0.5)
EOS PCT: 0 %
HEMATOCRIT: 48.7 % — AB (ref 36.0–46.0)
HEMOGLOBIN: 16.6 g/dL — AB (ref 12.0–15.0)
LYMPHS ABS: 0.6 10*3/uL — AB (ref 0.7–4.0)
LYMPHS PCT: 14 %
MCH: 29.9 pg (ref 26.0–34.0)
MCHC: 34.1 g/dL (ref 30.0–36.0)
MCV: 87.6 fL (ref 80.0–100.0)
MONO ABS: 0.4 10*3/uL (ref 0.1–1.0)
Monocytes Relative: 10 %
Neutro Abs: 3 10*3/uL (ref 1.7–7.7)
Neutrophils Relative %: 76 %
Platelets: 64 10*3/uL — ABNORMAL LOW (ref 150–400)
RBC: 5.56 MIL/uL — ABNORMAL HIGH (ref 3.87–5.11)
RDW: 14 % (ref 11.5–15.5)
WBC: 4 10*3/uL (ref 4.0–10.5)
nRBC: 0 % (ref 0.0–0.2)

## 2019-02-02 LAB — RAPID URINE DRUG SCREEN, HOSP PERFORMED
Amphetamines: NOT DETECTED
BARBITURATES: NOT DETECTED
Benzodiazepines: POSITIVE — AB
Cocaine: NOT DETECTED
Opiates: NOT DETECTED
Tetrahydrocannabinol: NOT DETECTED

## 2019-02-02 LAB — URINALYSIS, ROUTINE W REFLEX MICROSCOPIC
Bilirubin Urine: NEGATIVE
Glucose, UA: NEGATIVE mg/dL
Ketones, ur: NEGATIVE mg/dL
Nitrite: NEGATIVE
Protein, ur: 100 mg/dL — AB
Specific Gravity, Urine: 1.017 (ref 1.005–1.030)
pH: 5 (ref 5.0–8.0)

## 2019-02-02 LAB — CREATININE, URINE, RANDOM: Creatinine, Urine: 66.24 mg/dL

## 2019-02-02 LAB — SAVE SMEAR (SSMR)

## 2019-02-02 LAB — DIC (DISSEMINATED INTRAVASCULAR COAGULATION)PANEL
D-Dimer, Quant: 9.59 ug/mL-FEU — ABNORMAL HIGH (ref 0.00–0.50)
Fibrinogen: >800 mg/dL — ABNORMAL HIGH (ref 210–475)
INR: 1.12
Platelets: 65 10*3/uL — ABNORMAL LOW (ref 150–400)
Prothrombin Time: 14.3 seconds (ref 11.4–15.2)
Smear Review: NONE SEEN
aPTT: 29 seconds (ref 24–36)

## 2019-02-02 LAB — PROTEIN / CREATININE RATIO, URINE
Creatinine, Urine: 64.69 mg/dL
Protein Creatinine Ratio: 1.65 mg/mg{Cre} — ABNORMAL HIGH (ref 0.00–0.15)
Total Protein, Urine: 107 mg/dL

## 2019-02-02 LAB — LACTIC ACID, PLASMA
Lactic Acid, Venous: 4.1 mmol/L (ref 0.5–1.9)
Lactic Acid, Venous: 4.4 mmol/L (ref 0.5–1.9)

## 2019-02-02 LAB — MRSA PCR SCREENING: MRSA by PCR: NEGATIVE

## 2019-02-02 LAB — LACTATE DEHYDROGENASE: LDH: 300 U/L — ABNORMAL HIGH (ref 98–192)

## 2019-02-02 LAB — CK: Total CK: 503 U/L — ABNORMAL HIGH (ref 38–234)

## 2019-02-02 MED ORDER — ONDANSETRON HCL 4 MG/2ML IJ SOLN: 4.0000 mg | Freq: Four times a day (QID) | INTRAMUSCULAR | Status: DC | PRN

## 2019-02-02 MED ORDER — EPINEPHRINE 0.3 MG/0.3ML IJ SOAJ
0.3000 mg | Freq: Once | INTRAMUSCULAR | Status: AC
  Administered 2019-02-02: 0.3 mg via INTRAMUSCULAR
  Filled 2019-02-02: qty 0.3

## 2019-02-02 MED ORDER — IOHEXOL 300 MG/ML  SOLN: 30.0000 mL | Freq: Once | INTRAMUSCULAR | Status: DC | PRN

## 2019-02-02 MED ORDER — METHYLPREDNISOLONE SODIUM SUCC 125 MG IJ SOLR
125.0000 mg | Freq: Once | INTRAMUSCULAR | Status: AC
  Administered 2019-02-02: 125 mg via INTRAMUSCULAR

## 2019-02-02 MED ORDER — SODIUM CHLORIDE 0.9 % IV SOLN: INTRAVENOUS | Status: DC

## 2019-02-02 MED ORDER — LACTATED RINGERS IV BOLUS
1000.0000 mL | Freq: Once | INTRAVENOUS | Status: AC
  Administered 2019-02-02: 1000 mL via INTRAVENOUS

## 2019-02-02 MED ORDER — SODIUM BICARBONATE 8.4 % IV SOLN
INTRAVENOUS | Status: DC
  Administered 2019-02-02 – 2019-02-04 (×3): via INTRAVENOUS
  Filled 2019-02-02 (×6): qty 150

## 2019-02-02 MED ORDER — POTASSIUM CHLORIDE 20 MEQ PO PACK
40.0000 meq | PACK | Freq: Once | ORAL | Status: AC
  Administered 2019-02-02: 40 meq via ORAL
  Filled 2019-02-02: qty 2

## 2019-02-02 MED ORDER — DIPHENHYDRAMINE HCL 50 MG/ML IJ SOLN
50.0000 mg | Freq: Once | INTRAMUSCULAR | Status: AC
  Administered 2019-02-02: 50 mg via INTRAMUSCULAR

## 2019-02-02 NOTE — ED Notes (Signed)
Date and time results received: 02/02/19 1:49 PM    Test: Lactic Critical Value: 4.1  Name of Provider Notified: Criss Alvine  Orders Received? Or Actions Taken?: See order

## 2019-02-02 NOTE — H&P (Addendum)
NAME:  Sarah Fry, MRN:  [id number], DOB:  December 23, 1961, LOS: 0 ADMISSION DATE:  02/02/2019, CONSULTATION DATE:  02/02/19 REFERRING MD:  Dr. Criss Alvine, CHIEF COMPLAINT:  Eye, tongue swelling.     Brief History   58 y/o F who presented to [facility] on 2/3 with reports of right eye, tongue and right hand swelling.     History of present illness   58 y/o F who presented to [facility] on 2/3 with reports of waking at 0300 with right eye, tongue and right hand swelling.    At baseline she is a tobacco user (began at age 49, smokes 1 pack/day).  Additionally she also uses occasional marijuana.  No IV drug abuse or narcotics.  Patient recently traveled to [place] with her son.  They were there for 5 days and did extensive walking.  She states that her legs were fatigued and achy during the trip but thought that it was related to the extent of how much they had walked.  She denies new exposures or medications.  Reports that they were very careful with hand hygiene while in [place].    States she has had some diarrhea but no other infectious symptoms to include fever, chills, nausea vomiting.  Reports mild shortness of breath, right-sided facial swelling, upper extremity swelling.  Has an allergy to flexeril and "pain medications" but has not taken them. Used an EPI pen at 1000 am with no relief of symptoms.    She presented to [facility] ER with right sided facial swelling, right hand swelling and tongue swelling.  Hemodynamically stable / actually hypertensive & tachycardic.  Initial labs notable for Na 133, K 3.2, CO2 14, glucose 137, BUN 42, sr cr 2.03, AG 17, albumin 3.4, lactic acid 4.1, WBC 4, Hgb 16.6, and platelets 64.    PCCM consulted for evaluation.   Past Medical History  Kidney Stones Tobacco Abuse   Significant Hospital Events   2/03  Admit with R eye, tongue & R hand swelling   Consults:  PCCM   Procedures:    Significant Diagnostic Tests:  CT Chest / ABD / Pelvis 2/3 >>  UE Venous  Duplex 2/3 >> negative   Micro Data:  BCx2 2/3 >>  UA 2/3 >>   Antimicrobials:     Interim history/subjective:  As above  Objective   Blood pressure (!) 133/96, pulse (!) 137, temperature 98.2 F (36.8 C), temperature source Oral, resp. rate (!) 30, SpO2 99 %.       No intake or output data in the 24 hours ending 02/02/19 1257 There were no vitals filed for this visit.  Examination: General: adult female lying on ER stretcher in NAD HEENT: MM pink/moist, dark circular lesions on tongue (see below), right sided facial swelling with right eye closed, small erythematous lesion on right eyelid  Neuro: AAOx4, raspy voice, MAE CV: s1s2 rrr, tachycardic, no m/r/g PULM: even/non-labored, lungs bilaterally clear EX:BMWU, non-tender, bsx4 active  Extremities: warm/dry, BUE R>L edema, no LE edema, radial pulses equal bilaterally   Skin: no rashes or lesions        Resolved Hospital Problem list      Assessment & Plan:   Facial Swelling / UE Swelling  -presented with AKI, thrombocytopenia.  R/O HUS, atypical HUS, TTP, chest mass with obstructive physiology / SVC syndrome P: Assess CT Chest, ABD, Pelvis  Transfer to [facility] for evaluation by vascular, concern for UE swelling > if intervention needed would require Hand/Ortho  Hematology consulted  Thrombocytopenia  -R/O HUS, atypical HUS, TTP P: Send LDH, ADAMS 13, Shigella toxin, C3,C4, ANA, SCL-70, CK, Haptoglobin, peripheral smear & DIC panel  Trend CBC  AKI  AGMA / Lactic Acidosis  -BUN/Cr ratio consistent with pre-renal failre P: Trend BMP / urinary output Avoid nephrotoxic agents, ensure adequate renal perfusion Send urine sodium, urine creatinine  Trend lactate   Hyponatremia  Hypokalemia  P: Replace electrolytes as indicated   Best practice:  Diet: clear liquids Pain/Anxiety/Delirium protocol (if indicated): n/a VAP protocol (if indicated): n/a, aspiration precautions  DVT prophylaxis: SCD's  GI  prophylaxis: n/a Glucose control: n/a  Mobility: as tolerated  Code Status: Full Code  Family Communication: Patient and son updated at bedside.   Disposition: Admit to [facility] ICU  Labs   CBC: Recent Labs  Lab 02/02/19 1204  WBC 4.0  NEUTROABS PENDING  HGB 16.6*  HCT 48.7*  MCV 87.6  PLT 64*    Basic Metabolic Panel: Recent Labs  Lab 02/02/19 1204  NA 133*  K 3.2*  CL 102  CO2 14*  GLUCOSE 137*  BUN 42*  CREATININE 2.03*  CALCIUM 8.3*   GFR: CrCl cannot be calculated (Unknown ideal weight.). Recent Labs  Lab 02/02/19 1204  WBC 4.0    Liver Function Tests: Recent Labs  Lab 02/02/19 1204  AST 48*  ALT 29  ALKPHOS 90  BILITOT 0.5  PROT 6.9  ALBUMIN 3.4*   No results for input(s): LIPASE, AMYLASE in the last 168 hours. No results for input(s): AMMONIA in the last 168 hours.  ABG    Component Value Date/Time   TCO2 26 11/21/2017 1212     Coagulation Profile: No results for input(s): INR, PROTIME in the last 168 hours.  Cardiac Enzymes: No results for input(s): CKTOTAL, CKMB, CKMBINDEX, TROPONINI in the last 168 hours.  HbA1C: No results found for: HGBA1C  CBG: No results for input(s): GLUCAP in the last 168 hours.  Review of Systems: positives in bold  Gen: Denies fever, chills, weight change, fatigue, night sweats HEENT: Denies blurred vision, double vision, hearing loss, tinnitus, sinus congestion, rhinorrhea, sore throat, neck stiffness, dysphagia, facial swelling PULM: Denies shortness of breath, cough, sputum production, hemoptysis, wheezing CV: Denies chest pain, edema, orthopnea, paroxysmal nocturnal dyspnea, palpitations GI: Denies abdominal pain, nausea, vomiting, diarrhea, hematochezia, melena, constipation, change in bowel habits GU: Denies dysuria, hematuria, polyuria, oliguria, urethral discharge Endocrine: Denies hot or cold intolerance, polyuria, polyphagia or appetite change Derm: Denies rash, dry skin, scaling or peeling skin  change Heme: Denies easy bruising, bleeding, bleeding gums Neuro: Denies headache, numbness, weakness, slurred speech, loss of memory or consciousness Ext: Bilateral UE swelling   Past Medical History  She,  has a past medical history of Kidney stones.   Surgical History   History reviewed. No pertinent surgical history.   Social History   reports that she has been smoking. She has never used smokeless tobacco. She reports that she does not drink alcohol or use drugs.   Family History   Her family history is not on file.   Allergies Allergies  Allergen Reactions  . Flexeril [Cyclobenzaprine] Anaphylaxis     Home Medications  Prior to Admission medications   Medication Sig Start Date End Date Taking? Authorizing Provider  acetaminophen (TYLENOL) 325 MG tablet Take 1,300 mg by mouth every 6 (six) hours as needed for mild pain, fever or headache.   Yes [provider]  ibuprofen (ADVIL,MOTRIN) 200 MG tablet Take 600  mg by mouth every 6 (six) hours as needed for fever, mild pain or moderate pain.   Yes [provider]     Critical care time: na    Canary BrimBrandi Ollis, NP-C [facility] Pgr: [phone] or if no answer [phone] 02/02/2019, 12:57 PM  Attending Note:  58 year old female with no significant PMH presenting to PCCM with thrombocytopenia, acute renal failure, leukopenia and swelling of the right eye, tongue and both arms.  Patient is complaining of bilateral arm swelling.  On exam, both arms are under great deal of pressure but pulses are present.  I reviewed CXR myself, no acute disease noted.  Discussed with PCCM-NP.  Patient likely has TTP or HUS and will need plasmapheresis.  Will move to [facility].  Will ask H/O, renal and vascular surgery to see patient.  Will not place HD catheter until patient is seen by above consultants.  Labs re-ordered.  [facility]  The patient is critically ill with multiple organ  systems failure and requires high complexity decision making for assessment and support, frequent evaluation and titration of therapies, application of advanced monitoring technologies and extensive interpretation of multiple databases.   Critical Care Time devoted to patient care services described in this note is  45  Minutes. This time reflects time of care of this signee Dr Koren BoundWesam Katelin Kutsch. This critical care time does not reflect procedure time, or teaching time or supervisory time of PA/NP/Med student/Med Resident etc but could involve care discussion time.  Alyson ReedyWesam G. Zuhair Lariccia, M.D. [facility]. Pager: [phone]. After hours pager: [phone].

## 2019-02-02 NOTE — ED Notes (Signed)
Patient transported to MRI w/ technician.

## 2019-02-02 NOTE — Telephone Encounter (Signed)
Pt came in with son with an emergency, swollen tongue and not breathing well. 911 was called after triage with Dr. Alvy Bimler and pt left by ambulance. Pt did not sign AOB nor pay a copay .

## 2019-02-02 NOTE — ED Notes (Signed)
Date and time results received: 02/02/19 3:54 PM    Test: lactic acid Critical Value: 4.4  Name of Provider Notified: Criss Alvine MD  Orders Received? Or Actions Taken?: acknowledges order; hospitalist paged by secretary

## 2019-02-02 NOTE — ED Provider Notes (Addendum)
[facility]   CSN: [id number] Arrival date & time: 02/02/19  1130     History   Chief Complaint Chief Complaint  Patient presents with  . Allergic Reaction    HPI Sarah Fry is a 58 y.o. female.  HPI  58 year old female presents with swelling.  Yesterday was feeling fine and last night when she went to bed was feeling fine.  Woke up around 5 AM and noticed tongue swelling.  She feels to get may slightly be worse than when she first woke up.  She gave herself an epinephrine pen but does not feel like it did anything and she is not sure if it even work.  She states that she "did not feel anything" when she injected herself.  She feels like she is breathing shallow but not really having trouble breathing.  A little bit of difficulty with swallowing but she is able to swallow.  This is never happened to her before.  She states she has the EpiPen because she has allergies to Flexeril and hydrocodone but has not taken either of these meds.  She occasionally takes ibuprofen and/or Tylenol but has not recently and does not take any herbals or vitamins.  She is also noticed atraumatic swelling to her right eyelid and bilateral hands.  Past Medical History:  Diagnosis Date  . Kidney stones     There are no active problems to display for this patient.   History reviewed. No pertinent surgical history.   OB History   No obstetric history on file.      Home Medications    Prior to Admission medications   Medication Sig Start Date End Date Taking? Authorizing Provider  acetaminophen (TYLENOL) 325 MG tablet Take 1,300 mg by mouth every 6 (six) hours as needed for mild pain, fever or headache.   Yes [provider]  ibuprofen (ADVIL,MOTRIN) 200 MG tablet Take 600 mg by mouth every 6 (six) hours as needed for fever, mild pain or moderate pain.   Yes [provider]    Family History No family history on  file.  Social History Social History   Tobacco Use  . Smoking status: Current Every Day Smoker  . Smokeless tobacco: Never Used  Substance Use Topics  . Alcohol use: No  . Drug use: No     Allergies   Flexeril [cyclobenzaprine]   Review of Systems Review of Systems  Constitutional: Negative for fever.  HENT: Positive for facial swelling, trouble swallowing and voice change.   Respiratory: Positive for shortness of breath. Negative for cough.   Cardiovascular: Negative for chest pain.  Gastrointestinal: Negative for vomiting.  Musculoskeletal: Positive for joint swelling.  All other systems reviewed and are negative.    Physical Exam Updated Vital Signs BP (!) 149/113   Pulse (!) 134   Temp 98.2 F (36.8 C) (Oral)   Resp (!) 30   SpO2 99%   Physical Exam Vitals signs and nursing note reviewed.  Constitutional:      General: She is not in acute distress.    Appearance: She is well-developed.  HENT:     Head: Normocephalic and atraumatic.     Comments: Slightly muffled voice but no respiratory distress or stridor. Right eyelid is ecchymotic with some swelling Tongue is diffusely/symmetrically swollen    Right Ear: External ear normal.     Left Ear: External ear normal.     Nose: Nose normal.  Eyes:  General:        Right eye: No discharge.        Left eye: No discharge.  Cardiovascular:     Rate and Rhythm: Regular rhythm. Tachycardia present.     Heart sounds: Normal heart sounds.  Pulmonary:     Effort: Pulmonary effort is normal.     Breath sounds: Normal breath sounds. No wheezing, rhonchi or rales.  Abdominal:     Palpations: Abdomen is soft.     Tenderness: There is no abdominal tenderness.  Musculoskeletal:     Comments: Bilateral hands have diffuse non-pitting edema  Skin:    General: Skin is warm and dry.  Neurological:     Mental Status: She is alert.  Psychiatric:        Mood and Affect: Mood is not anxious.      ED Treatments /  Results  Labs (all labs ordered are listed, but only abnormal results are displayed) Labs Reviewed  COMPREHENSIVE METABOLIC PANEL - Abnormal; Notable for the following components:      Result Value   Sodium 133 (*)    Potassium 3.2 (*)    CO2 14 (*)    Glucose, Bld 137 (*)    BUN 42 (*)    Creatinine, Ser 2.03 (*)    Calcium 8.3 (*)    Albumin 3.4 (*)    AST 48 (*)    GFR calc non Af Amer 27 (*)    GFR calc Af Amer 31 (*)    Anion gap 17 (*)    All other components within normal limits  CBC WITH DIFFERENTIAL/PLATELET - Abnormal; Notable for the following components:   RBC 5.56 (*)    Hemoglobin 16.6 (*)    HCT 48.7 (*)    Platelets 64 (*)    Lymphs Abs 0.6 (*)    All other components within normal limits  LACTIC ACID, PLASMA - Abnormal; Notable for the following components:   Lactic Acid, Venous 4.1 (*)    All other components within normal limits  LACTIC ACID, PLASMA - Abnormal; Notable for the following components:   Lactic Acid, Venous 4.4 (*)    All other components within normal limits  DIC (DISSEMINATED INTRAVASCULAR COAGULATION) PANEL - Abnormal; Notable for the following components:   Fibrinogen >800 (*)    D-Dimer, Quant 9.59 (*)    Platelets 65 (*)    All other components within normal limits  LACTATE DEHYDROGENASE - Abnormal; Notable for the following components:   LDH 300 (*)    All other components within normal limits  CK - Abnormal; Notable for the following components:   Total CK 503 (*)    All other components within normal limits  POCT I-STAT EG7 - Abnormal; Notable for the following components:   pCO2, Ven 29.4 (*)    pO2, Ven 46.0 (*)    Bicarbonate 14.2 (*)    TCO2 15 (*)    Acid-base deficit 11.0 (*)    Sodium 131 (*)    Potassium 3.1 (*)    Calcium, Ion 1.00 (*)    HCT 50.0 (*)    Hemoglobin 17.0 (*)    All other components within normal limits  GASTROINTESTINAL PANEL BY PCR, STOOL (REPLACES STOOL CULTURE)  CULTURE, BLOOD (ROUTINE X 2)   CULTURE, BLOOD (ROUTINE X 2)  SAVE SMEAR (SSMR)  URINALYSIS, ROUTINE W REFLEX MICROSCOPIC  HIV ANTIBODY (ROUTINE TESTING W REFLEX)  C3 COMPLEMENT  C4 COMPLEMENT  COMPLEMENT, TOTAL  ADAMTS13 ACTIVITY  RAPID URINE DRUG SCREEN, HOSP PERFORMED  ANTINUCLEAR ANTIBODIES, IFA  ANTI-SCLERODERMA ANTIBODY  HAPTOGLOBIN  SODIUM, URINE, RANDOM  CREATININE, URINE, RANDOM  COMPLEMENT, TOTAL  C1 ESTERASE INHIBITOR    EKG EKG Interpretation  Date/Time:  Monday February 02 2019 11:50:27 EST Ventricular Rate:  128 PR Interval:    QRS Duration: 80 QT Interval:  311 QTC Calculation: 454 R Axis:   -79 Text Interpretation:  Sinus tachycardia Consider right atrial enlargement Left anterior fascicular block Abnormal R-wave progression, late transition Baseline wander in lead(s) II III aVF rate is faster compared to 2018 Confirmed by Pricilla Loveless [phone]) on 02/02/2019 11:58:40 AM   Radiology Vas [place] Upper Extremity Venous Duplex  Result Date: 02/02/2019 UPPER VENOUS STUDY  Indications: Allergic reaction causing arm, face, and tongue swelling Performing Technologist: Jeb Levering RDMS, RVT  Examination Guidelines: A complete evaluation includes B-mode imaging, spectral Doppler, color Doppler, and power Doppler as needed of all accessible portions of each vessel. Bilateral testing is considered an integral part of a complete examination. Limited examinations for reoccurring indications may be performed as noted.  Right Findings: +----------+------------+----------+---------+-----------+-------+ RIGHT     CompressiblePropertiesPhasicitySpontaneousSummary +----------+------------+----------+---------+-----------+-------+ IJV           Full                 Yes       Yes            +----------+------------+----------+---------+-----------+-------+ Subclavian                         Yes       Yes            +----------+------------+----------+---------+-----------+-------+ Axillary                            Yes       Yes            +----------+------------+----------+---------+-----------+-------+ Brachial      Full                 Yes       Yes            +----------+------------+----------+---------+-----------+-------+ Radial        Full                                          +----------+------------+----------+---------+-----------+-------+ Ulnar         Full                                          +----------+------------+----------+---------+-----------+-------+ Cephalic      Full                                          +----------+------------+----------+---------+-----------+-------+ Basilic       Full                                          +----------+------------+----------+---------+-----------+-------+  Left Findings: +----------+------------+----------+---------+-----------+-------+ LEFT      CompressiblePropertiesPhasicitySpontaneousSummary +----------+------------+----------+---------+-----------+-------+ IJV  Full                 Yes       Yes            +----------+------------+----------+---------+-----------+-------+ Subclavian                         Yes       Yes            +----------+------------+----------+---------+-----------+-------+ Axillary                           Yes       Yes            +----------+------------+----------+---------+-----------+-------+ Brachial      Full                 Yes       Yes            +----------+------------+----------+---------+-----------+-------+ Radial        Full                                          +----------+------------+----------+---------+-----------+-------+ Ulnar         Full                                          +----------+------------+----------+---------+-----------+-------+ Cephalic      Full                                          +----------+------------+----------+---------+-----------+-------+ Basilic        Full                                          +----------+------------+----------+---------+-----------+-------+  Summary: No evidence of deep vein or superficial vein thrombosis involving the right and left upper extremities.  *See table(s) above for measurements and observations.    Preliminary     Procedures .Critical Care Performed by: Pricilla LovelessGoldston, Elaf Clauson, MD Authorized by: Pricilla LovelessGoldston, Nevin Kozuch, MD   Critical care provider statement:    Critical care time (minutes):  45   Critical care time was exclusive of:  Separately billable procedures and treating other patients   Critical care was necessary to treat or prevent imminent or life-threatening deterioration of the following conditions:  Respiratory failure   Critical care was time spent personally by me on the following activities:  Discussions with consultants, evaluation of patient's response to treatment, examination of patient, ordering and performing treatments and interventions, ordering and review of laboratory studies, ordering and review of radiographic studies, pulse oximetry, re-evaluation of patient's condition, obtaining history from patient or surrogate and review of old charts   (including critical care time)  Medications Ordered in ED Medications  0.9 %  sodium chloride infusion (has no administration in time range)  ondansetron (ZOFRAN) injection 4 mg (has no administration in time range)  iohexol (OMNIPAQUE) 300 MG/ML solution 30 mL (has no administration in time range)  EPINEPHrine (EPI-PEN) injection 0.3 mg (0.3 mg Intramuscular Given 02/02/19 1202)  lactated ringers bolus 1,000 mL (0 mLs Intravenous Stopped 02/02/19 1507)     Initial Impression / Assessment and Plan / ED Course  I have reviewed the triage vital signs and the nursing notes.  Pertinent labs & imaging results that were available during my care of the patient were reviewed by me and considered in my medical decision making (see chart for details).      Patient is protecting her airway.  She does have significant swelling though has been stable since around 5 AM.  The swelling in other areas is also odd and her labs show new renal failure.  Also has new thrombocytopenia.  The tongue swelling is moderate however is not worsening and she is maintaining her airway.  She was given IM epinephrine though allergic cause seems less likely.  Given this seems relatively stable over several hours I do not think she needs an intubation but she will need to be closely monitored.  ICU has come to evaluate and will admit to the [facility], ICU.  With the renal failure and thrombocytopenia, TTP/HUS are in consideration.  Final Clinical Impressions(s) / ED Diagnoses   Final diagnoses:  Angioedema, initial encounter  Acute kidney injury [facility])    ED Discharge Orders    None       Pricilla Loveless, MD 02/02/19 1646    Pricilla Loveless, MD 02/02/19 [phone]

## 2019-02-02 NOTE — ED Notes (Signed)
Pt unable to sign for discharge due to topaz being available. Pt gave verbal consent to transport from [facility] ED to [facility] ICU, witnessed by [facility].

## 2019-02-02 NOTE — ED Notes (Signed)
This Clinical research associatewriter spoke hospitalist; hospitalist made aware of lactic acid. Hospitalist also made aware of primary nurse concern for increased swelling to hands; per hospitalist "elevate her hands above her head." Primary nurse made aware face to face.

## 2019-02-02 NOTE — Progress Notes (Addendum)
Sarah Fry 58 y.o.   Chief Complaint  Patient presents with  . Allergic Reaction  . Lymphadenopathy    HISTORY OF PRESENT ILLNESS: This is a 58 y.o. female complaining of facial and tongue swelling that started last night around 10 PM.  Not sure what is causing it.  No new medications.  Has also developed edema of her hands.  No syncope.  Protecting airway well.  Able to swallow saliva.  No stridor.  Used EpiPen once this morning. Past medical history significant for ventricular septal defect and kidney stones. On arrival patient awake and oriented x3 in no respiratory distress.  Tachycardic and normotensive.  No tachypnea.  No diaphoresis.  HPI   Prior to Admission medications   Medication Sig Start Date End Date Taking? Authorizing Provider  acetaminophen (TYLENOL) 325 MG tablet Take 1,300 mg by mouth every 6 (six) hours as needed for mild pain, fever or headache.    [provider]  ibuprofen (ADVIL,MOTRIN) 200 MG tablet Take 600 mg by mouth every 6 (six) hours as needed for fever, mild pain or moderate pain.    [provider]    Allergies  Allergen Reactions  . Flexeril [Cyclobenzaprine] Anaphylaxis    There are no active problems to display for this patient.   Past Medical History:  Diagnosis Date  . Kidney stones     History reviewed. No pertinent surgical history.  Social History   Socioeconomic History  . Marital status: Single    Spouse name: Not on file  . Number of children: Not on file  . Years of education: Not on file  . Highest education level: Not on file  Occupational History  . Not on file  Social Needs  . Financial resource strain: Not on file  . Food insecurity:    Worry: Not on file    Inability: Not on file  . Transportation needs:    Medical: Not on file    Non-medical: Not on file  Tobacco Use  . Smoking status: Current Every Day Smoker  . Smokeless tobacco: Never Used  Substance and Sexual Activity  .  Alcohol use: No  . Drug use: No  . Sexual activity: Not on file  Lifestyle  . Physical activity:    Days per week: Not on file    Minutes per session: Not on file  . Stress: Not on file  Relationships  . Social connections:    Talks on phone: Not on file    Gets together: Not on file    Attends religious service: Not on file    Active member of club or organization: Not on file    Attends meetings of clubs or organizations: Not on file    Relationship status: Not on file  . Intimate partner violence:    Fear of current or ex partner: Not on file    Emotionally abused: Not on file    Physically abused: Not on file    Forced sexual activity: Not on file  Other Topics Concern  . Not on file  Social History Narrative  . Not on file    History reviewed. No pertinent family history.   Review of Systems  Constitutional: Negative.  Negative for chills and fever.  Eyes: Negative for blurred vision and double vision.  Respiratory: Negative for shortness of breath.   Cardiovascular: Negative for chest pain.  Gastrointestinal: Negative for nausea and vomiting.  Skin: Negative for rash.  Neurological: Negative for dizziness, loss  of consciousness and headaches.  All other systems reviewed and are negative.  Vitals:   02/02/19 1107  BP: 135/89  Pulse: (!) 134  Resp: 16  Temp: 97.8 F (36.6 C)  SpO2: 97%     Physical Exam Vitals signs reviewed.  HENT:     Head: Normocephalic and atraumatic.     Comments: Right eye completely closed due to eyelid edema.    Mouth/Throat:     Comments: Significant tongue edema with several black spots. Eyes:     Conjunctiva/sclera: Conjunctivae normal.     Pupils: Pupils are equal, round, and reactive to light.  Neck:     Musculoskeletal: Normal range of motion.  Cardiovascular:     Rate and Rhythm: Tachycardia present.     Heart sounds: Normal heart sounds.  Pulmonary:     Effort: Pulmonary effort is normal.     Breath sounds:  Normal breath sounds.  Abdominal:     Tenderness: There is no abdominal tenderness.  Musculoskeletal:        General: Swelling (Forearms and hands) present.  Skin:    General: Skin is warm and dry.     Capillary Refill: Capillary refill takes less than 2 seconds.     Comments: Positive edema of both forearms and hands.  Neurological:     General: No focal deficit present.     Mental Status: She is alert and oriented to person, place, and time.  Psychiatric:        Mood and Affect: Mood normal.        Behavior: Behavior normal.      ASSESSMENT & PLAN: Benadryl 50 mg IM and Solu-Medrol 125 mg IM given.  IV started on left antecubital vein.  EMS called for transport to closest hospital. Case discussed with EMS personnel on arrival.  [name] was seen today for allergic reaction and lymphadenopathy.  Diagnoses and all orders for this visit:  Anaphylaxis, initial encounter -     diphenhydrAMINE (BENADRYL) injection 50 mg -     methylPREDNISolone sodium succinate (SOLU-MEDROL) 125 mg/2 mL injection 125 mg  Angioedema, initial encounter    To ED for further treatment and evaluation now.   Edwina Barth, MD [facility]

## 2019-02-02 NOTE — Progress Notes (Signed)
BUE venous duplex       has been completed. Preliminary results can be found under CV proc through chart review. Hewitt Garner, BS, RDMS, RVT   

## 2019-02-02 NOTE — Progress Notes (Signed)
[place]  [phone] [phone]     ID: Silva Bandy DOB: 29-Oct-1961  MR#: [id number]  OTL#:[id number]  Patient Care Team: Lajean Manes, MD as PCP - General (Internal Medicine) Chauncey Cruel, MD OTHER MD:  CHIEF COMPLAINT: Thrombocytopenia  CURRENT TREATMENT: Work-up in progress   HISTORY OF CURRENT ILLNESS: The patient tells me she was in her usual state of health until early this morning, around 3 AM, when she noted some swelling.  She has some bruises on her tongue.  She had some blood on the tissue when she wiped after her stool.  Initially she did not want to come to the hospital but as she noted some swelling of her arms and neck and face she called her son and she saw her primary physician this morning, who noted swelling of the forearms and hands as well as the face, diagnosed angioedema and possible anaphylaxis and gave the patient a Benadryl injection 50 mg and methylprednisolone 125 mg.  He also injected her with an EpiPen.  His measures were ineffective.  She was directed to the emergency room at [facility] were an extensive work-up has been initiated.  We were consulted because of concerns regarding possible TTP.  The patient's subsequent history is as detailed below.  INTERVAL HISTORY: I met with the patient and her son Matthew Saras in the emergency room  REVIEW OF SYSTEMS: As stated the patient was well until very recently.  She did have mild diarrhea in the past few weeks but this has resolved.  Has not been aware of any fever or sweats although she says it is hard for her to cool off.  She always feels warm.  There has been no rash.  A detailed review of systems was otherwise noncontributory  PAST MEDICAL HISTORY: Past Medical History:  Diagnosis Date  . Kidney stones     PAST SURGICAL HISTORY: History reviewed. No pertinent surgical history.  FAMILY HISTORY History reviewed. No pertinent family  history.  SOCIAL HISTORY:  The patient lives by herself.  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  Sons are Matthew Saras, present in the ED, and McKenzie, who is a twin and was not present.  The patient is not a church attender.    ADVANCED DIRECTIVES:    HEALTH MAINTENANCE: Social History   Tobacco Use  . Smoking status: Current Every Day Smoker  . Smokeless tobacco: Never Used  Substance Use Topics  . Alcohol use: No  . Drug use: No     Colonoscopy: Approximately 3 years ago  PAP:  Bone density:  Mammography: 3 years ago   Allergies  Allergen Reactions  . Flexeril [Cyclobenzaprine] Anaphylaxis    Current Facility-Administered Medications  Medication Dose Route Frequency Provider Last Rate Last Dose  . 0.9 %  sodium chloride infusion   Intravenous Continuous Ollis, Brandi L, NP      . iohexol (OMNIPAQUE) 300 MG/ML solution 30 mL  30 mL Oral Once PRN Ollis, Brandi L, NP      . ondansetron (ZOFRAN) injection 4 mg  4 mg Intravenous Q6H PRN Donita Brooks, NP       Current Outpatient Medications  Medication Sig Dispense Refill  . acetaminophen (TYLENOL) 325 MG tablet Take 1,300 mg by mouth every 6 (six) hours as needed for mild pain, fever or headache.    . ibuprofen (ADVIL,MOTRIN) 200 MG tablet Take 600 mg by mouth every 6 (six) hours as needed for fever,  mild pain or moderate pain.      OBJECTIVE: Latest white woman examined in bed.  Vitals:   02/02/19 1400 02/02/19 1537  BP: (!) 155/107 (!) 153/103  Pulse: (!) 123 (!) 109  Resp: (!) 31 20  Temp:    SpO2: 100% 100%     There is no height or weight on file to calculate BMI.   Wt Readings from Last 3 Encounters:  No data found for Wt    Ocular: Sclerae unicteric, pupils round and equal, EOMs intact.  The right eye is hard to examine because of swelling Ear-nose-throat: Stridor noted Lungs no rales or rhonchi, auscultated anterolaterally Heart regular rate and rhythm Abd soft, nontender, positive bowel  sounds Bilateral upper extremity edema, no lower extremity edema Neuro: non-focal, well-oriented, appropriate affect Breasts: Deferred Skin: No obvious rash   LAB RESULTS:  CMP     Component Value Date/Time   NA 131 (L) 02/02/2019 1321   K 3.1 (L) 02/02/2019 1321   CL 102 02/02/2019 1204   CO2 14 (L) 02/02/2019 1204   GLUCOSE 137 (H) 02/02/2019 1204   BUN 42 (H) 02/02/2019 1204   CREATININE 2.03 (H) 02/02/2019 1204   CALCIUM 8.3 (L) 02/02/2019 1204   PROT 6.9 02/02/2019 1204   ALBUMIN 3.4 (L) 02/02/2019 1204   AST 48 (H) 02/02/2019 1204   ALT 29 02/02/2019 1204   ALKPHOS 90 02/02/2019 1204   BILITOT 0.5 02/02/2019 1204   GFRNONAA 27 (L) 02/02/2019 1204   GFRAA 31 (L) 02/02/2019 1204    No results found for: TOTALPROTELP, ALBUMINELP, A1GS, A2GS, BETS, BETA2SER, GAMS, MSPIKE, SPEI  No results found for: KPAFRELGTCHN, LAMBDASER, KAPLAMBRATIO  Lab Results  Component Value Date   WBC 4.0 02/02/2019   NEUTROABS 3.0 02/02/2019   HGB 17.0 (H) 02/02/2019   HCT 50.0 (H) 02/02/2019   MCV 87.6 02/02/2019   PLT 65 (L) 02/02/2019    _0 @  No results found for: LABCA2  No components found for: [id number]  Recent Labs  Lab 02/02/19 1252  INR 1.12    No results found for: LABCA2  No results found for: ATF573  No results found for: UKG254  No results found for: YHC623  No results found for: CA2729  No components found for: HGQUANT  No results found for: CEA1 / No results found for: CEA1   No results found for: AFPTUMOR  No results found for: CHROMOGRNA  No results found for: PSA1  Admission on 02/02/2019  Component Date Value Ref Range Status  . Sodium 02/02/2019 133* 135 - 145 mmol/L Final  . Potassium 02/02/2019 3.2* 3.5 - 5.1 mmol/L Final  . Chloride 02/02/2019 102  98 - 111 mmol/L Final  . CO2 02/02/2019 14* 22 - 32 mmol/L Final  . Glucose, Bld 02/02/2019 137* 70 - 99 mg/dL Final  . BUN 02/02/2019 42* 6 - 20 mg/dL Final  . Creatinine,  Ser 02/02/2019 2.03* 0.44 - 1.00 mg/dL Final  . Calcium 02/02/2019 8.3* 8.9 - 10.3 mg/dL Final  . Total Protein 02/02/2019 6.9  6.5 - 8.1 g/dL Final  . Albumin 02/02/2019 3.4* 3.5 - 5.0 g/dL Final  . AST 02/02/2019 48* 15 - 41 U/L Final  . ALT 02/02/2019 29  0 - 44 U/L Final  . Alkaline Phosphatase 02/02/2019 90  38 - 126 U/L Final  . Total Bilirubin 02/02/2019 0.5  0.3 - 1.2 mg/dL Final  . GFR calc non Af Amer 02/02/2019 27* >60 mL/min Final  . GFR calc  Af Amer 02/02/2019 31* >60 mL/min Final  . Anion gap 02/02/2019 17* 5 - 15 Final   Performed at [facility], [place] [address] [place], [place] [address]  . WBC 02/02/2019 4.0  4.0 - 10.5 K/uL Final  . RBC 02/02/2019 5.56* 3.87 - 5.11 MIL/uL Final  . Hemoglobin 02/02/2019 16.6* 12.0 - 15.0 g/dL Final  . HCT 02/02/2019 48.7* 36.0 - 46.0 % Final  . MCV 02/02/2019 87.6  80.0 - 100.0 fL Final  . MCH 02/02/2019 29.9  26.0 - 34.0 pg Final  . MCHC 02/02/2019 34.1  30.0 - 36.0 g/dL Final  . RDW 02/02/2019 14.0  11.5 - 15.5 % Final  . Platelets 02/02/2019 64* 150 - 400 K/uL Final   Comment: REPEATED TO VERIFY PLATELET COUNT CONFIRMED BY SMEAR SPECIMEN CHECKED FOR CLOTS Immature Platelet Fraction may be clinically indicated, consider ordering this additional test [id number]   . nRBC 02/02/2019 0.0  0.0 - 0.2 % Final  . Neutrophils Relative % 02/02/2019 76  % Final  . Neutro Abs 02/02/2019 3.0  1.7 - 7.7 K/uL Final  . Lymphocytes Relative 02/02/2019 14  % Final  . Lymphs Abs 02/02/2019 0.6* 0.7 - 4.0 K/uL Final  . Monocytes Relative 02/02/2019 10  % Final  . Monocytes Absolute 02/02/2019 0.4  0.1 - 1.0 K/uL Final  . Eosinophils Relative 02/02/2019 0  % Final  . Eosinophils Absolute 02/02/2019 0.0  0.0 - 0.5 K/uL Final  . Basophils Relative 02/02/2019 0  % Final  . Basophils Absolute 02/02/2019 0.0  0.0 - 0.1 K/uL Final  . WBC Morphology 02/02/2019 MORPHOLOGY UNREMARKABLE   Final  . Abs Immature Granulocytes 02/02/2019  0.00  0.00 - 0.07 K/uL Final   Performed at [facility], [place] [address] [place], [place] [address]  . Lactic Acid, Venous 02/02/2019 4.1* 0.5 - 1.9 mmol/L Final   Comment: CRITICAL RESULT CALLED TO, READ BACK BY AND VERIFIED WITH: LEONARD,S. RN AT 1348 02/02/19 MULLINS,T Performed at [facility], [place] [address] [place], [place] [address]   . Lactic Acid, Venous 02/02/2019 4.4* 0.5 - 1.9 mmol/L Final   Comment: CRITICAL RESULT CALLED TO, READ BACK BY AND VERIFIED WITH: Jamal Maes [id number] @ [address] [place] Performed at [facility], [place] [address], [place], [place] [address]   . Prothrombin Time 02/02/2019 14.3  11.4 - 15.2 seconds Final  . INR 02/02/2019 1.12   Final  . aPTT 02/02/2019 29  24 - 36 seconds Final  . Fibrinogen 02/02/2019 >800* 210 - 475 mg/dL Final  . D-Dimer, Quant 02/02/2019 9.59* 0.00 - 0.50 ug/mL-FEU Final   Comment: (NOTE) At the manufacturer cut-off of 0.50 ug/mL FEU, this assay has been documented to exclude PE with a sensitivity and negative predictive value of 97 to 99%.  At this time, this assay has not been approved by the FDA to exclude DVT/VTE. Results should be correlated with clinical presentation.   . Platelets 02/02/2019 65* 150 - 400 K/uL Final   Comment: Immature Platelet Fraction may be clinically indicated, consider ordering this additional test [id number]   . Smear Review 02/02/2019 NO SCHISTOCYTES SEEN   Final   Performed at [facility], [place] [address] [place], [place] [address]  . pH, Ven 02/02/2019 7.293  7.250 - 7.430 Final  . pCO2, Ven 02/02/2019 29.4* 44.0 - 60.0 mmHg Final  . pO2, Ven 02/02/2019 46.0* 32.0 - 45.0 mmHg Final  . Bicarbonate 02/02/2019  14.2* 20.0 - 28.0 mmol/L Final  . TCO2 02/02/2019 15* 22 - 32 mmol/L Final  . O2 Saturation 02/02/2019 78.0  % Final  . Acid-base deficit 02/02/2019 11.0* 0.0 - 2.0 mmol/L Final  . Sodium 02/02/2019 131*  135 - 145 mmol/L Final  . Potassium 02/02/2019 3.1* 3.5 - 5.1 mmol/L Final  . Calcium, Ion 02/02/2019 1.00* 1.15 - 1.40 mmol/L Final  . HCT 02/02/2019 50.0* 36.0 - 46.0 % Final  . Hemoglobin 02/02/2019 17.0* 12.0 - 15.0 g/dL Final  . Patient temperature 02/02/2019 HIDE   Final  . Sample type 02/02/2019 VENOUS   Final  . LDH 02/02/2019 300* 98 - 192 U/L Final   Performed at [facility], [place] [address] [place], [place] [address]  . Total CK 02/02/2019 503* 38 - 234 U/L Final   Performed at [facility], [place] [address] [place], [place] [address]    (this displays the last labs from the last 3 days)  No results found for: TOTALPROTELP, ALBUMINELP, A1GS, A2GS, BETS, BETA2SER, GAMS, MSPIKE, SPEI (this displays SPEP labs)  No results found for: KPAFRELGTCHN, LAMBDASER, KAPLAMBRATIO (kappa/lambda light chains)  No results found for: HGBA, HGBA2QUANT, HGBFQUANT, HGBSQUAN (Hemoglobinopathy evaluation)   Lab Results  Component Value Date   LDH 300 (H) 02/02/2019    No results found for: IRON, TIBC, IRONPCTSAT (Iron and TIBC)  No results found for: FERRITIN  Urinalysis    Component Value Date/Time   COLORURINE AMBER (A) 11/21/2017 1200   APPEARANCEUR CLOUDY (A) 11/21/2017 1200   LABSPEC 1.018 11/21/2017 1200   PHURINE 7.0 11/21/2017 1200   GLUCOSEU NEGATIVE 11/21/2017 1200   HGBUR LARGE (A) 11/21/2017 1200   BILIRUBINUR NEGATIVE 11/21/2017 1200   KETONESUR 20 (A) 11/21/2017 1200   PROTEINUR 30 (A) 11/21/2017 1200   NITRITE POSITIVE (A) 11/21/2017 1200   LEUKOCYTESUR SMALL (A) 11/21/2017 1200     STUDIES: Ct Abdomen Pelvis Wo Contrast  Result Date: 02/02/2019 CLINICAL DATA:  Diarrhea. Acute kidney injury. Abdominal distension. Some shortness of breath. Atraumatic swelling of her right eyelid and both hands at 5 a.m. today with associated tongue swelling. She injected herself with an EpiPen at that time. EXAM: CT CHEST, ABDOMEN AND PELVIS  WITHOUT CONTRAST TECHNIQUE: Multidetector CT imaging of the chest, abdomen and pelvis was performed following the standard protocol without IV contrast. COMPARISON:  Abdomen and pelvis CT dated 11/21/2017. FINDINGS: CT CHEST FINDINGS Cardiovascular: Mild atheromatous aortic calcifications. Normal sized heart. Minimal pericardial effusion with a maximum thickness of 7 mm. Mediastinum/Nodes: No enlarged mediastinal, hilar, or axillary lymph nodes. Thyroid gland, trachea, and esophagus demonstrate no significant findings. Lungs/Pleura: 4 mm subpleural right lower lobe nodule on image number 111 of series 6., not previously included. No other lung nodules. No pleural fluid or airspace consolidation. Musculoskeletal: Thoracic and lower cervical spine degenerative changes. CT ABDOMEN PELVIS FINDINGS Hepatobiliary: Small area of focal fat deposition adjacent to the falciform ligament in the medial segment of the left lobe of the liver. Mildly dilated gallbladder. No gallbladder wall thickening or pericholecystic fluid and no visible gallstones. Pancreas: Unremarkable. No pancreatic ductal dilatation or surrounding inflammatory changes. Spleen: Normal in size without focal abnormality. Adrenals/Urinary Tract: Tiny upper pole left renal calculus. The previously seen right renal calculi are no longer demonstrated. No bladder or ureteral calculi and no hydronephrosis. Unremarkable adrenal glands. Stomach/Bowel: Diffuse wall thickening involving the esophageal ampulla or a small hiatal hernia with a maximum thickness of 9 mm on image number 54  series 2. Unremarkable small bowel, colon and appendix. Vascular/Lymphatic: Atheromatous arterial calcifications without aneurysm. No enlarged lymph nodes. Reproductive: Uterus and bilateral adnexa are unremarkable. Other: No abdominal wall hernia or abnormality. No abdominopelvic ascites. Musculoskeletal: Lumbar spine degenerative changes. IMPRESSION: 1. Diffuse wall thickening involving  the esophageal ampulla or a small hiatal hernia with a maximum thickness of 9 mm. This could be due to inflammation or a mass. Correlation with upper endoscopy is recommended. 2. 4 mm subpleural right lower lobe nodule. No follow-up needed if patient is low-risk. Non-contrast chest CT can be considered in 12 months if patient is high-risk. This recommendation follows the consensus statement: Guidelines for Management of Incidental Pulmonary Nodules Detected on CT Images: From the Fleischner Society 2017; Radiology 2017; 284:228-243. 3. Tiny, nonobstructing upper pole left renal calculus. Aortic Atherosclerosis (ICD10-I70.0). Electronically Signed   By: Claudie Revering M.D.   On: 02/02/2019 17:36   Ct Chest Wo Contrast  Result Date: 02/02/2019 CLINICAL DATA:  Diarrhea. Acute kidney injury. Abdominal distension. Some shortness of breath. Atraumatic swelling of her right eyelid and both hands at 5 a.m. today with associated tongue swelling. She injected herself with an EpiPen at that time. EXAM: CT CHEST, ABDOMEN AND PELVIS WITHOUT CONTRAST TECHNIQUE: Multidetector CT imaging of the chest, abdomen and pelvis was performed following the standard protocol without IV contrast. COMPARISON:  Abdomen and pelvis CT dated 11/21/2017. FINDINGS: CT CHEST FINDINGS Cardiovascular: Mild atheromatous aortic calcifications. Normal sized heart. Minimal pericardial effusion with a maximum thickness of 7 mm. Mediastinum/Nodes: No enlarged mediastinal, hilar, or axillary lymph nodes. Thyroid gland, trachea, and esophagus demonstrate no significant findings. Lungs/Pleura: 4 mm subpleural right lower lobe nodule on image number 111 of series 6., not previously included. No other lung nodules. No pleural fluid or airspace consolidation. Musculoskeletal: Thoracic and lower cervical spine degenerative changes. CT ABDOMEN PELVIS FINDINGS Hepatobiliary: Small area of focal fat deposition adjacent to the falciform ligament in the medial segment  of the left lobe of the liver. Mildly dilated gallbladder. No gallbladder wall thickening or pericholecystic fluid and no visible gallstones. Pancreas: Unremarkable. No pancreatic ductal dilatation or surrounding inflammatory changes. Spleen: Normal in size without focal abnormality. Adrenals/Urinary Tract: Tiny upper pole left renal calculus. The previously seen right renal calculi are no longer demonstrated. No bladder or ureteral calculi and no hydronephrosis. Unremarkable adrenal glands. Stomach/Bowel: Diffuse wall thickening involving the esophageal ampulla or a small hiatal hernia with a maximum thickness of 9 mm on image number 54 series 2. Unremarkable small bowel, colon and appendix. Vascular/Lymphatic: Atheromatous arterial calcifications without aneurysm. No enlarged lymph nodes. Reproductive: Uterus and bilateral adnexa are unremarkable. Other: No abdominal wall hernia or abnormality. No abdominopelvic ascites. Musculoskeletal: Lumbar spine degenerative changes. IMPRESSION: 1. Diffuse wall thickening involving the esophageal ampulla or a small hiatal hernia with a maximum thickness of 9 mm. This could be due to inflammation or a mass. Correlation with upper endoscopy is recommended. 2. 4 mm subpleural right lower lobe nodule. No follow-up needed if patient is low-risk. Non-contrast chest CT can be considered in 12 months if patient is high-risk. This recommendation follows the consensus statement: Guidelines for Management of Incidental Pulmonary Nodules Detected on CT Images: From the Fleischner Society 2017; Radiology 2017; 284:228-243. 3. Tiny, nonobstructing upper pole left renal calculus. Aortic Atherosclerosis (ICD10-I70.0). Electronically Signed   By: Claudie Revering M.D.   On: 02/02/2019 17:36   Vas [place] Upper Extremity Venous Duplex  Result Date: 02/02/2019 UPPER VENOUS STUDY  Indications: Allergic reaction  causing arm, face, and tongue swelling Performing Technologist: June Leap RDMS, RVT   Examination Guidelines: A complete evaluation includes B-mode imaging, spectral Doppler, color Doppler, and power Doppler as needed of all accessible portions of each vessel. Bilateral testing is considered an integral part of a complete examination. Limited examinations for reoccurring indications may be performed as noted.  Right Findings: +----------+------------+----------+---------+-----------+-------+ RIGHT     CompressiblePropertiesPhasicitySpontaneousSummary +----------+------------+----------+---------+-----------+-------+ IJV           Full                 Yes       Yes            +----------+------------+----------+---------+-----------+-------+ Subclavian                         Yes       Yes            +----------+------------+----------+---------+-----------+-------+ Axillary                           Yes       Yes            +----------+------------+----------+---------+-----------+-------+ Brachial      Full                 Yes       Yes            +----------+------------+----------+---------+-----------+-------+ Radial        Full                                          +----------+------------+----------+---------+-----------+-------+ Ulnar         Full                                          +----------+------------+----------+---------+-----------+-------+ Cephalic      Full                                          +----------+------------+----------+---------+-----------+-------+ Basilic       Full                                          +----------+------------+----------+---------+-----------+-------+  Left Findings: +----------+------------+----------+---------+-----------+-------+ LEFT      CompressiblePropertiesPhasicitySpontaneousSummary +----------+------------+----------+---------+-----------+-------+ IJV           Full                 Yes       Yes             +----------+------------+----------+---------+-----------+-------+ Subclavian                         Yes       Yes            +----------+------------+----------+---------+-----------+-------+ Axillary                           Yes       Yes            +----------+------------+----------+---------+-----------+-------+  Brachial      Full                 Yes       Yes            +----------+------------+----------+---------+-----------+-------+ Radial        Full                                          +----------+------------+----------+---------+-----------+-------+ Ulnar         Full                                          +----------+------------+----------+---------+-----------+-------+ Cephalic      Full                                          +----------+------------+----------+---------+-----------+-------+ Basilic       Full                                          +----------+------------+----------+---------+-----------+-------+  Summary: No evidence of deep vein or superficial vein thrombosis involving the right and left upper extremities.  *See table(s) above for measurements and observations.    Preliminary     ELIGIBLE FOR AVAILABLE RESEARCH PROTOCOL: no  ASSESSMENT: 58 y.o. [place] woman presenting with severe upper body swelling, bruising, moderate thrombocytopenia and significant renal dysfunction  REVIEW OF BLOOD FILM:  The red blood cells do not show any schistocytes, there is no significant dysplasia, there are no nucleated red cells.  There is moderate thrombocytopenia without artifactual platelet clumps.  The white cell series is unremarkable, with no significant left shift.  PLAN: The patient's platelet count was checked in 2 different ways, 1 in a red top tube and in 1 and a DIC panel tube and it was the same both ways so I think the reading at 65,000 is correct.  I expected an SVC syndrome picture but that is not apparent on  the CT scans just performed.  I think hereditary angioedema needs to be considered even though the patient has no history of this prior to this episode and there is no family history.  Complement studies are pending.  I do not believe we are dealing with TTP or HUS given the lack of schistocytes.  Adamts13 is pending.  The same is true of DIC with the addition that the coags are normal.    At this point I do not believe we are dealing with a primary bone marrow issue.  I expect the thrombocytopenia is reactive and secondary to what ever the primary process involved, which is also causing elevated inflammatory markers.  We will follow with you to resolution.  Chauncey Cruel, MD   02/02/2019 3:57 PM Medical Oncology and Hematology [facility] [address] [place], [place] [address] Tel. [phone]    Fax. [phone]

## 2019-02-02 NOTE — Patient Instructions (Signed)
° ° ° °  If you have lab work done today you will be contacted with your lab results within the next 2 weeks.  If you have not heard from us then please contact us. The fastest way to get your results is to register for My Chart. ° ° °IF you received an x-ray today, you will receive an invoice from [facility]. Please contact [facility] at [phone] with questions or concerns regarding your invoice.  ° °IF you received labwork today, you will receive an invoice from [facility]. Please contact [facility] at [phone] with questions or concerns regarding your invoice.  ° °Our billing staff will not be able to assist you with questions regarding bills from these companies. ° °You will be contacted with the lab results as soon as they are available. The fastest way to get your results is to activate your My Chart account. Instructions are located on the last page of this paperwork. If you have not heard from us regarding the results in 2 weeks, please contact this office. °  ° ° ° °

## 2019-02-02 NOTE — ED Notes (Signed)
Hospitalist at bedside speaking with patient.

## 2019-02-02 NOTE — Consult Note (Signed)
Referring Physician: Dr Molli KnockYacoub  Patient name: Sarah Fry Speciale MRN: [id number] DOB: [birth year] Sex: female  REASON FOR CONSULT: arm swelling rule out compartment syndrome  HPI: Sarah Fry Elena is a 58 y.o. female, with several day history of progressive swelling right face and hands.  This is not really painful to her.  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  She has not had prior episodes.  She has no shortness of breath.  She did have a tooth removed about 10 days ago.  Past Medical History:  Diagnosis Date  . Kidney stones    History reviewed. No pertinent surgical history.  History reviewed. No pertinent family history.  SOCIAL HISTORY: Social History   Socioeconomic History  . Marital status: Single    Spouse name: Not on file  . Number of children: Not on file  . Years of education: Not on file  . Highest education level: Not on file  Occupational History  . Not on file  Social Needs  . Financial resource strain: Not on file  . Food insecurity:    Worry: Not on file    Inability: Not on file  . Transportation needs:    Medical: Not on file    Non-medical: Not on file  Tobacco Use  . Smoking status: Current Every Day Smoker  . Smokeless tobacco: Never Used  Substance and Sexual Activity  . Alcohol use: No  . Drug use: No  . Sexual activity: Not on file  Lifestyle  . Physical activity:    Days per week: Not on file    Minutes per session: Not on file  . Stress: Not on file  Relationships  . Social connections:    Talks on phone: Not on file    Gets together: Not on file    Attends religious service: Not on file    Active member of club or organization: Not on file    Attends meetings of clubs or organizations: Not on file    Relationship status: Not on file  . Intimate partner violence:    Fear of current or ex partner: Not on file    Emotionally abused: Not on file    Physically abused: Not on file    Forced sexual  activity: Not on file  Other Topics Concern  . Not on file  Social History Narrative  . Not on file    Allergies  Allergen Reactions  . Flexeril [Cyclobenzaprine] Anaphylaxis    Current Facility-Administered Medications  Medication Dose Route Frequency Provider Last Rate Last Dose  . 0.9 %  sodium chloride infusion   Intravenous Continuous Ollis, Brandi L, NP      . iohexol (OMNIPAQUE) 300 MG/ML solution 30 mL  30 mL Oral Once PRN Ollis, Brandi L, NP      . ondansetron (ZOFRAN) injection 4 mg  4 mg Intravenous Q6H PRN Ollis, Brandi L, NP      . potassium chloride (KLOR-CON) packet 40 mEq  40 mEq Oral Once Bufford ButtnerUpton, Elizabeth, MD      . sodium bicarbonate 150 mEq in dextrose 5 % 1,000 mL infusion   Intravenous Continuous Bufford ButtnerUpton, Elizabeth, MD 100 mL/hr at 02/02/19 1904     Current Outpatient Medications  Medication Sig Dispense Refill  . acetaminophen (TYLENOL) 325 MG tablet Take 1,300 mg by mouth every 6 (six) hours as needed for mild pain, fever or headache.    . ibuprofen (ADVIL,MOTRIN) 200 MG tablet  Take 600 mg by mouth every 6 (six) hours as needed for fever, mild pain or moderate pain.      ROS:   General:  No weight loss, Fever, chills  HEENT: No recent headaches, no nasal bleeding, no visual changes, no sore throat  Neurologic: No dizziness, blackouts, seizures. No recent symptoms of stroke or mini- stroke. No recent episodes of slurred speech, or temporary blindness.  Cardiac: No recent episodes of chest pain/pressure, no shortness of breath at rest.  No shortness of breath with exertion.  Denies history of atrial fibrillation or irregular heartbeat  Vascular: No history of rest pain in feet.  No history of claudication.  No history of non-healing ulcer, No history of DVT   Pulmonary: No home oxygen, no productive cough, no hemoptysis,  No asthma or wheezing  Musculoskeletal:  [ ]  Arthritis, [ ]  Low back pain,  [ ]  Joint pain  Hematologic:No history of hypercoagulable  state.  No history of easy bleeding.  No history of anemia  Gastrointestinal: No hematochezia or melena,  No gastroesophageal reflux, no trouble swallowing  Urinary: [ ]  chronic Kidney disease, [ ]  on HD - [ ]  MWF or [ ]  TTHS, [ ]  Burning with urination, [ ]  Frequent urination, [ ]  Difficulty urinating;   Skin: No rashes  Psychological: No history of anxiety,  No history of depression   Physical Examination  Vitals:   02/02/19 1545 02/02/19 1749 02/02/19 1800 02/02/19 1905  BP: (!) 169/109 (!) 155/100 (!) 147/100 (!) 160/116  Pulse: (!) 112 (!) 115 (!) 119 (!) 119  Resp: (!) 23 (!) 23 (!) 24 20  Temp:      TempSrc:      SpO2: 100% 100% 100% 99%    There is no height or weight on file to calculate BMI.  General:  Alert and oriented, no acute distress HEENT: right orbital swelling with eye nearly closed Neck: No JVD Pulmonary: Clear to auscultation bilaterally, no obvious chest wall collaterals Cardiac: Regular Rate and Rhythm  Abdomen: Soft, non-tender, non-distended Skin: No rash Extremity Pulses:  2+ radial, brachial, femoral, dorsalis pedis pulses bilaterally Musculoskeletal: No deformity right arm edema non pitting to shoulder level involving hand, right antecubital IV, left arm edema from elbow to hand left upper arm is soft with no edema, no lower extremity edema, no pain on palpation except mild in left hand and right wrist  Neurologic: Upper and lower extremity motor 5/5 and symmetric  DATA:  CBC    Component Value Date/Time   WBC 4.0 02/02/2019 1204   RBC 5.56 (H) 02/02/2019 1204   HGB 17.0 (H) 02/02/2019 1321   HCT 50.0 (H) 02/02/2019 1321   PLT 65 (L) 02/02/2019 1252   MCV 87.6 02/02/2019 1204   MCH 29.9 02/02/2019 1204   MCHC 34.1 02/02/2019 1204   RDW 14.0 02/02/2019 1204   LYMPHSABS 0.6 (L) 02/02/2019 1204   MONOABS 0.4 02/02/2019 1204   EOSABS 0.0 02/02/2019 1204   BASOSABS 0.0 02/02/2019 1204    BMET    Component Value Date/Time   NA 131 (L)  02/02/2019 1321   K 3.1 (L) 02/02/2019 1321   CL 102 02/02/2019 1204   CO2 14 (L) 02/02/2019 1204   GLUCOSE 137 (H) 02/02/2019 1204   BUN 42 (H) 02/02/2019 1204   CREATININE 2.03 (H) 02/02/2019 1204   CALCIUM 8.3 (L) 02/02/2019 1204   GFRNONAA 27 (L) 02/02/2019 1204   GFRAA 31 (L) 02/02/2019 1204   CT chest  lung nodule, no obvious SVC obstruction no contrast due to renal dysfunction MRV head no obvious venous sinus thrombosis  CT abdomen pelvis esophageal thickening  ASSESSMENT:  Asymmetric arm swelling right greater than left, asymmetric face swelling right greater than left.  May have a component of SVC or innominate vein obstruction but this is usually treated medically and does not cause airway compromise.  She does have swollen arms and hands but no tenderness on palpation so doubt compartment syndrome.   PLAN:  Elevate arms on pillows Possibly can get contrast study of central veins if renal function improves No need for fasciotomy of arms Please call if renal function improves and we can investigate further if symptoms do not resolve  Would remove IVs from her arm as this may exacerbate the arm swelling especially if she develops infiltration thrombosis or cellulitis  Fabienne Bruns, MD [facility] Office: [phone] Pager: [phone]

## 2019-02-02 NOTE — ED Triage Notes (Addendum)
Per EMS, patient from [facility], c/o allergic reaction since last night at 2100. Swelling to right eye, tongue, and right hand. Denies worsening swelling. Known allergies to flexeril and "pain medicines." Denies having either allergens. Reports using epi pen this morning at approximately 1000. States sx are improving since meds at [facility].  125 mg Solumedrol IM at UC 50mg  Benadryl IM at UC  20g L AC from UC

## 2019-02-02 NOTE — ED Notes (Signed)
Bed: JTT0 Expected date:  Expected time:  Means of arrival:  Comments: EMS- 57yo F, allergic reaction

## 2019-02-02 NOTE — Consult Note (Addendum)
[facility]  HISTORY AND PHYSICAL  Sarah Fry is an 58 y.o. female.    Chief Complaint: R hand, R eye, and tongue swelling  HPI: Pt is a 53F with PMH sig for kidney stones but otherwise healthy who is now seen in consultation at the request of Dr. Molli KnockYacoub for evaluation and recommendations surrounding AKI and thrombocytopenia.  Pt was in her usual state of health until Friday/ Saturday.  She recently went [place] [facility] and they had a great time.  She had a couple of episodes of mild diarrhea there but otherwise was quite careful about sharing food/ drinks and hand hygiene.  She started having arthralgias/ myalgias Friday or Saturday and was treated with Tylenol and ibuprofen.  She became progressively worse over the past 24 hours with R hand, R face, and tongue swelling- tongue with bruising.  She took an EpiPen earlier today with little effect.    She presented to the hospital this AM.  Here, She was found to be hypertensive and tachycardic.  She was noted to have a AKI with Cr of 2.03 up from baseline of 0.6 10/2017, Na 133, K 3.2, CO2 14, albumin 3.4, lactate 4.1.  WBC 4, Hgb 16.6, plts 64.  No schistocytes seen on smear.  D dimer 9.59, fibrinogen > 800, INR 1.12 with PT 14.3 and APTT 29.  LDH 300, CK 503, Tbili WNL.  In this setting we are asked to see.    Pt denies f/c,n/v.  Reports raspy voice which coincided with all these symptoms.  Denies prior history of kidney or autoimmune diseases.  No other contacts are sick in her family.  Denies drug use.  No rashes.  No stridor.  No foamy/ frothy urine, blood in urine.  Making normal amount of urine.  Slight LE edema.    PMH: Past Medical History:  Diagnosis Date  . Kidney stones    PSH: History reviewed. No pertinent surgical history.   Past Medical History:  Diagnosis Date  . Kidney stones     Medications:  Scheduled:   (Not in a hospital admission)   ALLERGIES:   Allergies   Allergen Reactions  . Flexeril [Cyclobenzaprine] Anaphylaxis    FAM HX: History reviewed. No pertinent family history.  Social History:   reports that she has been smoking. She has never used smokeless tobacco. She reports that she does not drink alcohol or use drugs.  ROS: ROS: all other systems reviewed and are negative except as per HPI  Blood pressure (!) 153/103, pulse (!) 109, temperature 98.2 F (36.8 C), temperature source Oral, resp. rate 20, SpO2 100 %. PHYSICAL EXAM: Physical Exam  GEN appears tired, NAD HEENT R eye swollen shut, L eye EOMI PERRL.  Tongue red and swollen, not obstructing airway.  1 cm bruising on tongue as well NECK no JVD, no overt LAD PULM normal WOB, clear bilaterally no c/w/r CV tachycardic, no m/r/g ABD soft, nontender, nondistended, NABS, no HSM EXT trace LE edema, RUE with 3+ edema, hard to close hand, fingertips are cool and dusky, difficult to palpate radial pulse with degree of swelling.  More mild swelling on L hand, fingertips are cool too NEURO AAO x 3 nonfocal SKIN no rashes     Results for orders placed or performed during the hospital encounter of 02/02/19 (from the past 48 hour(s))  Comprehensive metabolic panel     Status: Abnormal   Collection Time: 02/02/19 12:04 PM  Result  Value Ref Range   Sodium 133 (L) 135 - 145 mmol/L   Potassium 3.2 (L) 3.5 - 5.1 mmol/L   Chloride 102 98 - 111 mmol/L   CO2 14 (L) 22 - 32 mmol/L   Glucose, Bld 137 (H) 70 - 99 mg/dL   BUN 42 (H) 6 - 20 mg/dL   Creatinine, Ser 1.32 (H) 0.44 - 1.00 mg/dL   Calcium 8.3 (L) 8.9 - 10.3 mg/dL   Total Protein 6.9 6.5 - 8.1 g/dL   Albumin 3.4 (L) 3.5 - 5.0 g/dL   AST 48 (H) 15 - 41 U/L   ALT 29 0 - 44 U/L   Alkaline Phosphatase 90 38 - 126 U/L   Total Bilirubin 0.5 0.3 - 1.2 mg/dL   GFR calc non Af Amer 27 (L) >60 mL/min   GFR calc Af Amer 31 (L) >60 mL/min   Anion gap 17 (H) 5 - 15    Comment: Performed at [facility], [address] [place], [place] [address]  CBC with Differential     Status: Abnormal   Collection Time: 02/02/19 12:04 PM  Result Value Ref Range   WBC 4.0 4.0 - 10.5 K/uL   RBC 5.56 (H) 3.87 - 5.11 MIL/uL   Hemoglobin 16.6 (H) 12.0 - 15.0 g/dL   HCT 27.2 (H) 53.6 - 64.4 %   MCV 87.6 80.0 - 100.0 fL   MCH 29.9 26.0 - 34.0 pg   MCHC 34.1 30.0 - 36.0 g/dL   RDW 03.4 74.2 - 59.5 %   Platelets 64 (L) 150 - 400 K/uL    Comment: REPEATED TO VERIFY PLATELET COUNT CONFIRMED BY SMEAR SPECIMEN CHECKED FOR CLOTS Immature Platelet Fraction may be clinically indicated, consider ordering this additional test [id number]    nRBC 0.0 0.0 - 0.2 %   Neutrophils Relative % 76 %   Neutro Abs 3.0 1.7 - 7.7 K/uL   Lymphocytes Relative 14 %   Lymphs Abs 0.6 (L) 0.7 - 4.0 K/uL   Monocytes Relative 10 %   Monocytes Absolute 0.4 0.1 - 1.0 K/uL   Eosinophils Relative 0 %   Eosinophils Absolute 0.0 0.0 - 0.5 K/uL   Basophils Relative 0 %   Basophils Absolute 0.0 0.0 - 0.1 K/uL   WBC Morphology MORPHOLOGY UNREMARKABLE    Abs Immature Granulocytes 0.00 0.00 - 0.07 K/uL    Comment: Performed at [facility], [address] [place], [place] [address]  DIC panel     Status: Abnormal   Collection Time: 02/02/19 12:52 PM  Result Value Ref Range   Prothrombin Time 14.3 11.4 - 15.2 seconds   INR 1.12    aPTT 29 24 - 36 seconds   Fibrinogen >800 (H) 210 - 475 mg/dL   D-Dimer, Quant 8.84 (H) 0.00 - 0.50 ug/mL-FEU    Comment: (NOTE) At the manufacturer cut-off of 0.50 ug/mL FEU, this assay has been documented to exclude PE with a sensitivity and negative predictive value of 97 to 99%.  At this time, this assay has not been approved by the FDA to exclude DVT/VTE. Results should be correlated with clinical presentation.    Platelets 65 (L) 150 - 400 K/uL    Comment: Immature Platelet Fraction may be clinically indicated, consider ordering this additional test [id number]    Smear Review NO SCHISTOCYTES  SEEN     Comment: Performed at [facility], [address] [place], [place] [address]  Lactic acid, plasma  Status: Abnormal   Collection Time: 02/02/19  1:10 PM  Result Value Ref Range   Lactic Acid, Venous 4.1 (HH) 0.5 - 1.9 mmol/L    Comment: CRITICAL RESULT CALLED TO, READ BACK BY AND VERIFIED WITH: LEONARD,S. RN AT 1348 02/02/19 MULLINS,T Performed at [facility], [address] [place], [place] [address]   POCT I-Stat EG7     Status: Abnormal   Collection Time: 02/02/19  1:21 PM  Result Value Ref Range   pH, Ven 7.293 7.250 - 7.430   pCO2, Ven 29.4 (L) 44.0 - 60.0 mmHg   pO2, Ven 46.0 (H) 32.0 - 45.0 mmHg   Bicarbonate 14.2 (L) 20.0 - 28.0 mmol/L   TCO2 15 (L) 22 - 32 mmol/L   O2 Saturation 78.0 %   Acid-base deficit 11.0 (H) 0.0 - 2.0 mmol/L   Sodium 131 (L) 135 - 145 mmol/L   Potassium 3.1 (L) 3.5 - 5.1 mmol/L   Calcium, Ion 1.00 (L) 1.15 - 1.40 mmol/L   HCT 50.0 (H) 36.0 - 46.0 %   Hemoglobin 17.0 (H) 12.0 - 15.0 g/dL   Patient temperature HIDE    Sample type VENOUS   Lactic acid, plasma     Status: Abnormal   Collection Time: 02/02/19  3:07 PM  Result Value Ref Range   Lactic Acid, Venous 4.4 (HH) 0.5 - 1.9 mmol/L    Comment: CRITICAL RESULT CALLED TO, READ BACK BY AND VERIFIED WITH: Claretta FraiseMORGAN ROSSER,RN [phone] @ 1553 BY J SCOTTON Performed at [facility], [address] [place], [place] [address]   Lactate dehydrogenase     Status: Abnormal   Collection Time: 02/02/19  3:07 PM  Result Value Ref Range   LDH 300 (H) 98 - 192 U/L    Comment: Performed at [facility], [address] [place], [place] [address]  Save Smear     Status: None   Collection Time: 02/02/19  3:07 PM  Result Value Ref Range   Smear Review SMEAR STAINED AND AVAILABLE FOR REVIEW     Comment: Performed at [facility], [address] [place], [place] [address]  CK     Status: Abnormal    Collection Time: 02/02/19  3:07 PM  Result Value Ref Range   Total CK 503 (H) 38 - 234 U/L    Comment: Performed at [facility], [address] [place], [place] [address]    [place] Upper Extremity Venous Duplex  Result Date: 02/02/2019 UPPER VENOUS STUDY  Indications: Allergic reaction causing arm, face, and tongue swelling Performing Technologist: Jeb LeveringJill Parker RDMS, RVT  Examination Guidelines: A complete evaluation includes B-mode imaging, spectral Doppler, color Doppler, and power Doppler as needed of all accessible portions of each vessel. Bilateral testing is considered an integral part of a complete examination. Limited examinations for reoccurring indications may be performed as noted.  Right Findings: +----------+------------+----------+---------+-----------+-------+ RIGHT     CompressiblePropertiesPhasicitySpontaneousSummary +----------+------------+----------+---------+-----------+-------+ IJV           Full                 Yes       Yes            +----------+------------+----------+---------+-----------+-------+ Subclavian                         Yes       Yes            +----------+------------+----------+---------+-----------+-------+  Axillary                           Yes       Yes            +----------+------------+----------+---------+-----------+-------+ Brachial      Full                 Yes       Yes            +----------+------------+----------+---------+-----------+-------+ Radial        Full                                          +----------+------------+----------+---------+-----------+-------+ Ulnar         Full                                          +----------+------------+----------+---------+-----------+-------+ Cephalic      Full                                          +----------+------------+----------+---------+-----------+-------+ Basilic       Full                                           +----------+------------+----------+---------+-----------+-------+  Left Findings: +----------+------------+----------+---------+-----------+-------+ LEFT      CompressiblePropertiesPhasicitySpontaneousSummary +----------+------------+----------+---------+-----------+-------+ IJV           Full                 Yes       Yes            +----------+------------+----------+---------+-----------+-------+ Subclavian                         Yes       Yes            +----------+------------+----------+---------+-----------+-------+ Axillary                           Yes       Yes            +----------+------------+----------+---------+-----------+-------+ Brachial      Full                 Yes       Yes            +----------+------------+----------+---------+-----------+-------+ Radial        Full                                          +----------+------------+----------+---------+-----------+-------+ Ulnar         Full                                          +----------+------------+----------+---------+-----------+-------+ Cephalic      Full                                          +----------+------------+----------+---------+-----------+-------+  Basilic       Full                                          +----------+------------+----------+---------+-----------+-------+  Summary: No evidence of deep vein or superficial vein thrombosis involving the right and left upper extremities.  *See table(s) above for measurements and observations.    Preliminary     Assessment/Plan  1.  R face/ tongue/ hand swelling: differential is broad at this time.  EpiPen administered with little effect, not a typical presentation of angioedema.  There are no schistocytes on smear and labs aren't reflective of TTP/ HUS.  ? If malignancy is the culprit here causing SVC syndrome. CT C/A/P pending.  Lesions on tongue not typical of EM and nothing on mucosal surfaces.   I'll also do a vasculitis workup- see below.  2.  AKI and thrombocytopenia: labs aren't reflective of TTP/ HUS at this point- [id number] pending as well as total complement, C3 and C4 complement, ANA, HIV, anti-scleroderma antibody.  Will also send ANCA, SPEP, serum free light chains.  UA and UP/C pending too.  CT scan to eval for hydro/ recurrent kidney stones.  Heme also consulted and we appreciate their assistance.    3.  Metabolic acidosis: lactate 4.4.  Will do bicarb gtt, also give supplemental K given K of 3.1  4.   Dispo: to [facility]   Bufford Buttner 02/02/2019, 5:26 PM

## 2019-02-03 ENCOUNTER — Inpatient Hospital Stay (HOSPITAL_COMMUNITY): Payer: 59

## 2019-02-03 ENCOUNTER — Other Ambulatory Visit: Payer: Self-pay

## 2019-02-03 DIAGNOSIS — E860 Dehydration: Secondary | ICD-10-CM

## 2019-02-03 DIAGNOSIS — I361 Nonrheumatic tricuspid (valve) insufficiency: Secondary | ICD-10-CM

## 2019-02-03 LAB — TYPE AND SCREEN
ABO/RH(D): A POS
Antibody Screen: NEGATIVE

## 2019-02-03 LAB — CBC
HCT: 39.2 % (ref 36.0–46.0)
HCT: 31.7 % — ABNORMAL LOW (ref 36.0–46.0)
HEMOGLOBIN: 13.9 g/dL (ref 12.0–15.0)
Hemoglobin: 11.3 g/dL — ABNORMAL LOW (ref 12.0–15.0)
MCH: 29.6 pg (ref 26.0–34.0)
MCH: 29.7 pg (ref 26.0–34.0)
MCHC: 35.5 g/dL (ref 30.0–36.0)
MCHC: 35.6 g/dL (ref 30.0–36.0)
MCV: 83.6 fL (ref 80.0–100.0)
MCV: 83.2 fL (ref 80.0–100.0)
Platelets: 29 10*3/uL — CL (ref 150–400)
Platelets: 49 10*3/uL — ABNORMAL LOW (ref 150–400)
RBC: 4.69 MIL/uL (ref 3.87–5.11)
RBC: 3.81 MIL/uL — ABNORMAL LOW (ref 3.87–5.11)
RDW: 13.6 % (ref 11.5–15.5)
RDW: 13.5 % (ref 11.5–15.5)
WBC: 4.2 10*3/uL (ref 4.0–10.5)
WBC: 3.5 10*3/uL — ABNORMAL LOW (ref 4.0–10.5)
nRBC: 0 % (ref 0.0–0.2)
nRBC: 0 % (ref 0.0–0.2)

## 2019-02-03 LAB — C3 COMPLEMENT: C3 Complement: 72 mg/dL — ABNORMAL LOW (ref 82–167)

## 2019-02-03 LAB — BASIC METABOLIC PANEL
Anion gap: 14 (ref 5–15)
BUN: 43 mg/dL — ABNORMAL HIGH (ref 6–20)
CO2: 20 mmol/L — ABNORMAL LOW (ref 22–32)
Calcium: 7.3 mg/dL — ABNORMAL LOW (ref 8.9–10.3)
Chloride: 97 mmol/L — ABNORMAL LOW (ref 98–111)
Creatinine, Ser: 1.4 mg/dL — ABNORMAL HIGH (ref 0.44–1.00)
GFR calc Af Amer: 48 mL/min — ABNORMAL LOW (ref >60)
GFR calc non Af Amer: 42 mL/min — ABNORMAL LOW (ref >60)
Glucose, Bld: 129 mg/dL — ABNORMAL HIGH (ref 70–99)
Potassium: 3.5 mmol/L (ref 3.5–5.1)
Sodium: 131 mmol/L — ABNORMAL LOW (ref 135–145)

## 2019-02-03 LAB — C4 COMPLEMENT: Complement C4, Body Fluid: 25 mg/dL (ref 14–44)

## 2019-02-03 LAB — ADAMTS13 ACTIVITY: Adamts 13 Activity: 42.5 % — ABNORMAL LOW (ref >66.8)

## 2019-02-03 LAB — LACTIC ACID, PLASMA
Lactic Acid, Venous: 2.3 mmol/L (ref 0.5–1.9)
Lactic Acid, Venous: 3.4 mmol/L (ref 0.5–1.9)
Lactic Acid, Venous: 1.2 mmol/L (ref 0.5–1.9)

## 2019-02-03 LAB — HAPTOGLOBIN: Haptoglobin: 219 mg/dL (ref 33–346)

## 2019-02-03 LAB — ADAMTS13 ACTIVITY REFLEX

## 2019-02-03 LAB — MAGNESIUM: Magnesium: 2.1 mg/dL (ref 1.7–2.4)

## 2019-02-03 LAB — HIV ANTIBODY (ROUTINE TESTING W REFLEX): HIV SCREEN 4TH GENERATION: NONREACTIVE

## 2019-02-03 LAB — PHOSPHORUS: Phosphorus: 2.5 mg/dL (ref 2.5–4.6)

## 2019-02-03 LAB — ECHOCARDIOGRAM COMPLETE: Weight: 1869.5 oz

## 2019-02-03 LAB — TROPONIN I: Troponin I: <0.03 ng/mL (ref <0.03)

## 2019-02-03 LAB — ABO/RH: ABO/RH(D): A POS

## 2019-02-03 MED ORDER — NICOTINE 14 MG/24HR TD PT24
14.0000 mg | MEDICATED_PATCH | Freq: Every day | TRANSDERMAL | Status: DC
  Administered 2019-02-03 – 2019-02-08 (×6): 14 mg via TRANSDERMAL
  Filled 2019-02-03 (×6): qty 1

## 2019-02-03 MED ORDER — PHENOL 1.4 % MT LIQD
1.0000 | OROMUCOSAL | Status: DC | PRN
  Administered 2019-02-03: 1 via OROMUCOSAL
  Filled 2019-02-03: qty 177

## 2019-02-03 MED ORDER — PANTOPRAZOLE SODIUM 40 MG IV SOLR
40.0000 mg | INTRAVENOUS | Status: DC
  Administered 2019-02-03 – 2019-02-05 (×3): 40 mg via INTRAVENOUS
  Filled 2019-02-03 (×3): qty 40

## 2019-02-03 MED ORDER — IBUPROFEN 200 MG PO TABS
600.0000 mg | ORAL_TABLET | Freq: Once | ORAL | Status: AC
  Administered 2019-02-03: 600 mg via ORAL
  Filled 2019-02-03: qty 3

## 2019-02-03 MED ORDER — ACETAMINOPHEN 325 MG PO TABS
650.0000 mg | ORAL_TABLET | Freq: Four times a day (QID) | ORAL | Status: DC | PRN
  Administered 2019-02-03 (×2): 650 mg via ORAL
  Filled 2019-02-03 (×2): qty 2

## 2019-02-03 MED ORDER — SODIUM CHLORIDE 0.9 % IV BOLUS
500.0000 mL | Freq: Once | INTRAVENOUS | Status: AC
  Administered 2019-02-03: 500 mL via INTRAVENOUS

## 2019-02-03 MED ORDER — SODIUM CHLORIDE 0.9% IV SOLUTION
Freq: Once | INTRAVENOUS | Status: AC
  Administered 2019-02-03: 11:00:00 via INTRAVENOUS

## 2019-02-03 MED ORDER — CHLORHEXIDINE GLUCONATE 0.12 % MT SOLN
15.0000 mL | Freq: Two times a day (BID) | OROMUCOSAL | Status: DC
  Administered 2019-02-03 – 2019-02-08 (×9): 15 mL via OROMUCOSAL
  Filled 2019-02-03 (×7): qty 15

## 2019-02-03 MED ORDER — ORAL CARE MOUTH RINSE
15.0000 mL | Freq: Two times a day (BID) | OROMUCOSAL | Status: DC
  Administered 2019-02-03 – 2019-02-08 (×8): 15 mL via OROMUCOSAL

## 2019-02-03 NOTE — Progress Notes (Signed)
Sarah Fry   DOB:1961/02/01   WU#:[id number]   GNF#:[id number]  Subjective:  Sarah Fry is considerably improved-- decreased swelling, though not completely resolved; face much "like herself," able to open Right eye, speaks without mumbling-- all of that is better than yesterday. Son in room  Platelets dropped further this AM: Results for Sarah Fry, Sarah Fry (MRN [id number]) as of 02/03/2019 19:02  Ref. Range 11/21/2017 12:15 02/02/2019 12:04 02/02/2019 12:52 02/03/2019 04:28  Platelets Latest Ref Range: 150 - 400 K/uL 287 64 (L) 65 (L) 29 (LL)   Evening repeat count pending  Objective: middle aged White woman examined in bed Vitals:   02/03/19 1700 02/03/19 1737  BP: 121/60   Pulse: (!) 118   Resp: (!) 26   Temp:  99.1 F (37.3 C)  SpO2: 92%     Body mass index is 19.44 kg/m.  Intake/Output Summary (Last 24 hours) at 02/03/2019 1900 Last data filed at 02/03/2019 1700 Gross per 24 hour  Intake 2797 ml  Output 1600 ml  Net 1197 ml      CBG (last 3)  No results for input(s): GLUCAP in the last 72 hours.   Labs:  Lab Results  Component Value Date   WBC 4.2 02/03/2019   HGB 13.9 02/03/2019   HCT 39.2 02/03/2019   MCV 83.6 02/03/2019   PLT 29 (LL) 02/03/2019   NEUTROABS 3.0 02/02/2019    @LASTCHEMISTRY @  Urine Studies No results for input(s): UHGB, CRYS in the last 72 hours.  Invalid input(s): UACOL, UAPR, USPG, UPH, UTP, UGL, UKET, UBIL, UNIT, UROB, ULEU, UEPI, UWBC, URBC, UBAC, CAST, UCOM, [place]  Basic Metabolic Panel: Recent Labs  Lab 02/02/19 1204 02/02/19 1321 02/03/19 0428  NA 133* 131* 131*  K 3.2* 3.1* 3.5  CL 102  --  97*  CO2 14*  --  20*  GLUCOSE 137*  --  129*  BUN 42*  --  43*  CREATININE 2.03*  --  1.40*  CALCIUM 8.3*  --  7.3*  MG  --   --  2.1  PHOS  --   --  2.5   GFR Estimated Creatinine Clearance: 37.1 mL/min (A) (by C-G formula based on SCr of 1.4 mg/dL (H)). Liver Function Tests: Recent Labs  Lab 02/02/19 1204  AST 48*  ALT 29   ALKPHOS 90  BILITOT 0.5  PROT 6.9  ALBUMIN 3.4*   No results for input(s): LIPASE, AMYLASE in the last 168 hours. No results for input(s): AMMONIA in the last 168 hours. Coagulation profile Recent Labs  Lab 02/02/19 1252  INR 1.12    CBC: Recent Labs  Lab 02/02/19 1204 02/02/19 1252 02/02/19 1321 02/03/19 0428  WBC 4.0  --   --  4.2  NEUTROABS 3.0  --   --   --   HGB 16.6*  --  17.0* 13.9  HCT 48.7*  --  50.0* 39.2  MCV 87.6  --   --  83.6  PLT 64* 65*  --  29*   Cardiac Enzymes: Recent Labs  Lab 02/02/19 1507 02/03/19 1517  CKTOTAL 503*  --   TROPONINI  --  <0.03   BNP: Invalid input(s): POCBNP CBG: No results for input(s): GLUCAP in the last 168 hours. D-Dimer Recent Labs    02/02/19 1252  DDIMER 9.59*   Hgb A1c No results for input(s): HGBA1C in the last 72 hours. Lipid Profile No results for input(s): CHOL, HDL, LDLCALC, TRIG, CHOLHDL, LDLDIRECT in the last 72 hours. Thyroid function  studies No results for input(s): TSH, T4TOTAL, T3FREE, THYROIDAB in the last 72 hours.  Invalid input(s): FREET3 Anemia work up No results for input(s): VITAMINB12, FOLATE, FERRITIN, TIBC, IRON, RETICCTPCT in the last 72 hours. Microbiology Recent Results (from the past 240 hour(s))  Culture, blood (Routine X 2) w Reflex to ID Panel     Status: None (Preliminary result)   Collection Time: 02/02/19  3:07 PM  Result Value Ref Range Status   Specimen Description   Final    BLOOD RIGHT ARM Performed at [facility], [address] [place], [place] [address]    Special Requests   Final    BOTTLES DRAWN AEROBIC ONLY Blood Culture results may not be optimal due to an inadequate volume of blood received in culture bottles Performed at [facility], [address] [place], [place] [address]    Culture   Final    NO GROWTH < 24 HOURS Performed at [facility], [address], [place], [place] [address]    Report Status  PENDING  Incomplete  Culture, blood (Routine X 2) w Reflex to ID Panel     Status: None (Preliminary result)   Collection Time: 02/02/19  3:07 PM  Result Value Ref Range Status   Specimen Description   Final    BLOOD RIGHT ARM Performed at [facility], [address] [place], [place] [address]    Special Requests   Final    BOTTLES DRAWN AEROBIC ONLY Blood Culture results may not be optimal due to an inadequate volume of blood received in culture bottles Performed at [facility], [address], [place], [place] [address]    Culture   Final    NO GROWTH < 24 HOURS Performed at [facility], [address], [place], [place] [address]    Report Status PENDING  Incomplete  MRSA PCR Screening     Status: None   Collection Time: 02/02/19  8:30 PM  Result Value Ref Range Status   MRSA by PCR NEGATIVE NEGATIVE Final    Comment:        The GeneXpert MRSA Assay (FDA approved for NASAL specimens only), is one component of a comprehensive MRSA colonization surveillance program. It is not intended to diagnose MRSA infection nor to guide or monitor treatment for MRSA infections. Performed at [facility], [address] [place], [place] [address]       Studies:  Ct Abdomen Pelvis Wo Contrast  Result Date: 02/02/2019 CLINICAL DATA:  Diarrhea. Acute kidney injury. Abdominal distension. Some shortness of breath. Atraumatic swelling of her right eyelid and both hands at 5 a.m. today with associated tongue swelling. She injected herself with an EpiPen at that time. EXAM: CT CHEST, ABDOMEN AND PELVIS WITHOUT CONTRAST TECHNIQUE: Multidetector CT imaging of the chest, abdomen and pelvis was performed following the standard protocol without IV contrast. COMPARISON:  Abdomen and pelvis CT dated 11/21/2017. FINDINGS: CT CHEST FINDINGS Cardiovascular: Mild atheromatous aortic calcifications. Normal sized heart. Minimal pericardial effusion with a  maximum thickness of 7 mm. Mediastinum/Nodes: No enlarged mediastinal, hilar, or axillary lymph nodes. Thyroid gland, trachea, and esophagus demonstrate no significant findings. Lungs/Pleura: 4 mm subpleural right lower lobe nodule on image number 111 of series 6., not previously included. No other lung nodules. No pleural fluid or airspace consolidation. Musculoskeletal: Thoracic and lower cervical spine degenerative changes. CT ABDOMEN PELVIS FINDINGS Hepatobiliary: Small area of focal fat deposition adjacent  to the falciform ligament in the medial segment of the left lobe of the liver. Mildly dilated gallbladder. No gallbladder wall thickening or pericholecystic fluid and no visible gallstones. Pancreas: Unremarkable. No pancreatic ductal dilatation or surrounding inflammatory changes. Spleen: Normal in size without focal abnormality. Adrenals/Urinary Tract: Tiny upper pole left renal calculus. The previously seen right renal calculi are no longer demonstrated. No bladder or ureteral calculi and no hydronephrosis. Unremarkable adrenal glands. Stomach/Bowel: Diffuse wall thickening involving the esophageal ampulla or a small hiatal hernia with a maximum thickness of 9 mm on image number 54 series 2. Unremarkable small bowel, colon and appendix. Vascular/Lymphatic: Atheromatous arterial calcifications without aneurysm. No enlarged lymph nodes. Reproductive: Uterus and bilateral adnexa are unremarkable. Other: No abdominal wall hernia or abnormality. No abdominopelvic ascites. Musculoskeletal: Lumbar spine degenerative changes. IMPRESSION: 1. Diffuse wall thickening involving the esophageal ampulla or a small hiatal hernia with a maximum thickness of 9 mm. This could be due to inflammation or a mass. Correlation with upper endoscopy is recommended. 2. 4 mm subpleural right lower lobe nodule. No follow-up needed if patient is low-risk. Non-contrast chest CT can be considered in 12 months if patient is high-risk. This  recommendation follows the consensus statement: Guidelines for Management of Incidental Pulmonary Nodules Detected on CT Images: From the Fleischner Society 2017; Radiology 2017; 284:228-243. 3. Tiny, nonobstructing upper pole left renal calculus. Aortic Atherosclerosis (ICD10-I70.0). Electronically Signed   By: Beckie Salts M.D.   On: 02/02/2019 17:36   Ct Chest Wo Contrast  Result Date: 02/02/2019 CLINICAL DATA:  Diarrhea. Acute kidney injury. Abdominal distension. Some shortness of breath. Atraumatic swelling of her right eyelid and both hands at 5 a.m. today with associated tongue swelling. She injected herself with an EpiPen at that time. EXAM: CT CHEST, ABDOMEN AND PELVIS WITHOUT CONTRAST TECHNIQUE: Multidetector CT imaging of the chest, abdomen and pelvis was performed following the standard protocol without IV contrast. COMPARISON:  Abdomen and pelvis CT dated 11/21/2017. FINDINGS: CT CHEST FINDINGS Cardiovascular: Mild atheromatous aortic calcifications. Normal sized heart. Minimal pericardial effusion with a maximum thickness of 7 mm. Mediastinum/Nodes: No enlarged mediastinal, hilar, or axillary lymph nodes. Thyroid gland, trachea, and esophagus demonstrate no significant findings. Lungs/Pleura: 4 mm subpleural right lower lobe nodule on image number 111 of series 6., not previously included. No other lung nodules. No pleural fluid or airspace consolidation. Musculoskeletal: Thoracic and lower cervical spine degenerative changes. CT ABDOMEN PELVIS FINDINGS Hepatobiliary: Small area of focal fat deposition adjacent to the falciform ligament in the medial segment of the left lobe of the liver. Mildly dilated gallbladder. No gallbladder wall thickening or pericholecystic fluid and no visible gallstones. Pancreas: Unremarkable. No pancreatic ductal dilatation or surrounding inflammatory changes. Spleen: Normal in size without focal abnormality. Adrenals/Urinary Tract: Tiny upper pole left renal calculus.  The previously seen right renal calculi are no longer demonstrated. No bladder or ureteral calculi and no hydronephrosis. Unremarkable adrenal glands. Stomach/Bowel: Diffuse wall thickening involving the esophageal ampulla or a small hiatal hernia with a maximum thickness of 9 mm on image number 54 series 2. Unremarkable small bowel, colon and appendix. Vascular/Lymphatic: Atheromatous arterial calcifications without aneurysm. No enlarged lymph nodes. Reproductive: Uterus and bilateral adnexa are unremarkable. Other: No abdominal wall hernia or abnormality. No abdominopelvic ascites. Musculoskeletal: Lumbar spine degenerative changes. IMPRESSION: 1. Diffuse wall thickening involving the esophageal ampulla or a small hiatal hernia with a maximum thickness of 9 mm. This could be due to inflammation or a mass. Correlation with upper endoscopy  is recommended. 2. 4 mm subpleural right lower lobe nodule. No follow-up needed if patient is low-risk. Non-contrast chest CT can be considered in 12 months if patient is high-risk. This recommendation follows the consensus statement: Guidelines for Management of Incidental Pulmonary Nodules Detected on CT Images: From the Fleischner Society 2017; Radiology 2017; 284:228-243. 3. Tiny, nonobstructing upper pole left renal calculus. Aortic Atherosclerosis (ICD10-I70.0). Electronically Signed   By: Beckie Salts M.D.   On: 02/02/2019 17:36   Mr Mrv Head Wo Cm  Result Date: 02/02/2019 CLINICAL DATA:  58 y/o F; swelling to right eye, tongue, and right hand. Allergic reaction. EXAM: MR VENOGRAM the HEAD WITHOUT CONTRAST TECHNIQUE: Angiographic images of the intracranial venous structures were obtained using MRV technique without intravenous contrast. COMPARISON:  None. FINDINGS: No flow related signal within the left transverse and sigmoid sinus drainage system. Patent superior sagittal sinus, straight sinus, right transverse sinus, and right sigmoid sinus. The right transverse and  sigmoid sinus drainage system is large in caliber. IMPRESSION: No flow related signal in the left transverse and sigmoid dural venous sinuses. Large caliber right-sided transverse drainage system. Findings likely likely represent variant anatomy. Thrombosis is considered unlikely. If clinically indicated this can be confirmed with CT or MRV with contrast. If the patient can not received contrast, then MRI of the head without contrast including thin (eg. FIESTA/CISS) sections may be useful. Electronically Signed   By: Mitzi Hansen M.D.   On: 02/02/2019 19:21   Vas [place] Upper Extremity Venous Duplex  Result Date: 02/03/2019 UPPER VENOUS STUDY  Indications: Allergic reaction causing arm, face, and tongue swelling Performing Technologist: Jeb Levering RDMS, RVT  Examination Guidelines: A complete evaluation includes B-mode imaging, spectral Doppler, color Doppler, and power Doppler as needed of all accessible portions of each vessel. Bilateral testing is considered an integral part of a complete examination. Limited examinations for reoccurring indications may be performed as noted.  Right Findings: +----------+------------+----------+---------+-----------+-------+ RIGHT     CompressiblePropertiesPhasicitySpontaneousSummary +----------+------------+----------+---------+-----------+-------+ IJV           Full                 Yes       Yes            +----------+------------+----------+---------+-----------+-------+ Subclavian                         Yes       Yes            +----------+------------+----------+---------+-----------+-------+ Axillary                           Yes       Yes            +----------+------------+----------+---------+-----------+-------+ Brachial      Full                 Yes       Yes            +----------+------------+----------+---------+-----------+-------+ Radial        Full                                           +----------+------------+----------+---------+-----------+-------+ Ulnar         Full                                          +----------+------------+----------+---------+-----------+-------+  Cephalic      Full                                          +----------+------------+----------+---------+-----------+-------+ Basilic       Full                                          +----------+------------+----------+---------+-----------+-------+  Left Findings: +----------+------------+----------+---------+-----------+-------+ LEFT      CompressiblePropertiesPhasicitySpontaneousSummary +----------+------------+----------+---------+-----------+-------+ IJV           Full                 Yes       Yes            +----------+------------+----------+---------+-----------+-------+ Subclavian                         Yes       Yes            +----------+------------+----------+---------+-----------+-------+ Axillary                           Yes       Yes            +----------+------------+----------+---------+-----------+-------+ Brachial      Full                 Yes       Yes            +----------+------------+----------+---------+-----------+-------+ Radial        Full                                          +----------+------------+----------+---------+-----------+-------+ Ulnar         Full                                          +----------+------------+----------+---------+-----------+-------+ Cephalic      Full                                          +----------+------------+----------+---------+-----------+-------+ Basilic       Full                                          +----------+------------+----------+---------+-----------+-------+  Summary: No evidence of deep vein or superficial vein thrombosis involving the right and left upper extremities.  *See table(s) above for measurements and observations.  Diagnosing  physician: Fabienne Bruns MD Electronically signed by Fabienne Bruns MD on 02/03/2019 at 10:27:33 AM.    Final     Assessment: 58 y.o. [place] woman presenting with severe upper body swelling, bruising, moderate/severe thrombocytopenia and significant renal dysfunction  Plan:  At this point it appears the patient's renal dysfunction was primarily due to excessive NSAID use and dehydration. As noted, there were no schistocytes on smear. The patient's general improvement and  specifically the drop in her creatinine and decreased swelling make TTP/HUS even more unlikely.  Complement studies still pending, but doubt hereditary angioedema.  The further drop in platelets this AM and the high D-Dimer continue to raise questions regarding clotting diathesis, despite normal PT. aPTT  I will continue to follow with you to resolution     Lowella DellGustav C Filip Luten, MD 02/03/2019  7:00 PM Medical Oncology and Hematology [facility] [address] [place], [place] [id number] Tel. [phone]    Fax. [phone]

## 2019-02-03 NOTE — Progress Notes (Signed)
[facility] Progress Note    Assessment/ Plan:   1.  R face/ tongue/ hand swelling: differential is broad at this time.  EpiPen administered with little effect, not a typical presentation of angioedema.  There are no schistocytes on smear and labs aren't reflective of TTP/ HUS.  ? If malignancy is the culprit here causing SVC syndrome, but CT not really reflective of this Lesions on tongue not typical of EM/ DRESS and nothing on mucosal surfaces.  I'll also do a vasculitis workup- see below.  2.  AKI and thrombocytopenia: labs aren't reflective of TTP/ HUS at this point- [id number] pending as well as total complement, C3 sl low, C4 complement WNL, ANA, HIV, anti-scleroderma antibody. No MAHA. Will also send ANCA, SPEP, serum free light chains.  UA with some blood, UP/C 1.6 g.  CT scan to eval for hydro/ recurrent kidney stones negative for large stones.  Heme also consulted and we appreciate their assistance.  Getting plt transfusion today for plts 29.  Cr is improving.  3.  Metabolic acidosis: lactate 4.4.  Will do bicarb gtt, also give supplemental K given K of 3.1  4.  Esophageal thickening- consider GI c/s  5.   Dispo: remains in ICU  Subjective:    Cr improved.  Facial swelling improved.  Feels sleepy.     Objective:   BP (!) 163/96   Pulse (!) 115   Temp 99.1 F (37.3 C) (Oral)   Resp (!) 26   Wt 53 kg   SpO2 98%   Intake/Output Summary (Last 24 hours) at 02/03/2019 0959 Last data filed at 02/03/2019 0000 Gross per 24 hour  Intake 1400 ml  Output 700 ml  Net 700 ml   Weight change:   Physical Exam: GEN appears tired, NAD HEENT R eye improved, L eye EOMI PERRL.  Tongue red and swollen, not obstructing airway.  1 cm bruising on tongue as well, unchanged NECK no JVD, no overt LAD PULM normal WOB, clear bilaterally no c/w/r CV tachycardic, no m/r/g ABD soft, nontender, nondistended, NABS, no HSM EXT trace LE edema, bilateral UE edema which is  improving. NEURO AAO x 3 nonfocal SKIN no rashes   Imaging: Ct Abdomen Pelvis Wo Contrast  Result Date: 02/02/2019 CLINICAL DATA:  Diarrhea. Acute kidney injury. Abdominal distension. Some shortness of breath. Atraumatic swelling of her right eyelid and both hands at 5 a.m. today with associated tongue swelling. She injected herself with an EpiPen at that time. EXAM: CT CHEST, ABDOMEN AND PELVIS WITHOUT CONTRAST TECHNIQUE: Multidetector CT imaging of the chest, abdomen and pelvis was performed following the standard protocol without IV contrast. COMPARISON:  Abdomen and pelvis CT dated 11/21/2017. FINDINGS: CT CHEST FINDINGS Cardiovascular: Mild atheromatous aortic calcifications. Normal sized heart. Minimal pericardial effusion with a maximum thickness of 7 mm. Mediastinum/Nodes: No enlarged mediastinal, hilar, or axillary lymph nodes. Thyroid gland, trachea, and esophagus demonstrate no significant findings. Lungs/Pleura: 4 mm subpleural right lower lobe nodule on image number 111 of series 6., not previously included. No other lung nodules. No pleural fluid or airspace consolidation. Musculoskeletal: Thoracic and lower cervical spine degenerative changes. CT ABDOMEN PELVIS FINDINGS Hepatobiliary: Small area of focal fat deposition adjacent to the falciform ligament in the medial segment of the left lobe of the liver. Mildly dilated gallbladder. No gallbladder wall thickening or pericholecystic fluid and no visible gallstones. Pancreas: Unremarkable. No pancreatic ductal dilatation or surrounding inflammatory changes. Spleen: Normal in size without focal abnormality. Adrenals/Urinary Tract: Tiny upper  pole left renal calculus. The previously seen right renal calculi are no longer demonstrated. No bladder or ureteral calculi and no hydronephrosis. Unremarkable adrenal glands. Stomach/Bowel: Diffuse wall thickening involving the esophageal ampulla or a small hiatal hernia with a maximum thickness of 9 mm on  image number 54 series 2. Unremarkable small bowel, colon and appendix. Vascular/Lymphatic: Atheromatous arterial calcifications without aneurysm. No enlarged lymph nodes. Reproductive: Uterus and bilateral adnexa are unremarkable. Other: No abdominal wall hernia or abnormality. No abdominopelvic ascites. Musculoskeletal: Lumbar spine degenerative changes. IMPRESSION: 1. Diffuse wall thickening involving the esophageal ampulla or a small hiatal hernia with a maximum thickness of 9 mm. This could be due to inflammation or a mass. Correlation with upper endoscopy is recommended. 2. 4 mm subpleural right lower lobe nodule. No follow-up needed if patient is low-risk. Non-contrast chest CT can be considered in 12 months if patient is high-risk. This recommendation follows the consensus statement: Guidelines for Management of Incidental Pulmonary Nodules Detected on CT Images: From the Fleischner Society 2017; Radiology 2017; 284:228-243. 3. Tiny, nonobstructing upper pole left renal calculus. Aortic Atherosclerosis (ICD10-I70.0). Electronically Signed   By: Beckie SaltsSteven  Reid M.D.   On: 02/02/2019 17:36   Ct Chest Wo Contrast  Result Date: 02/02/2019 CLINICAL DATA:  Diarrhea. Acute kidney injury. Abdominal distension. Some shortness of breath. Atraumatic swelling of her right eyelid and both hands at 5 a.m. today with associated tongue swelling. She injected herself with an EpiPen at that time. EXAM: CT CHEST, ABDOMEN AND PELVIS WITHOUT CONTRAST TECHNIQUE: Multidetector CT imaging of the chest, abdomen and pelvis was performed following the standard protocol without IV contrast. COMPARISON:  Abdomen and pelvis CT dated 11/21/2017. FINDINGS: CT CHEST FINDINGS Cardiovascular: Mild atheromatous aortic calcifications. Normal sized heart. Minimal pericardial effusion with a maximum thickness of 7 mm. Mediastinum/Nodes: No enlarged mediastinal, hilar, or axillary lymph nodes. Thyroid gland, trachea, and esophagus demonstrate no  significant findings. Lungs/Pleura: 4 mm subpleural right lower lobe nodule on image number 111 of series 6., not previously included. No other lung nodules. No pleural fluid or airspace consolidation. Musculoskeletal: Thoracic and lower cervical spine degenerative changes. CT ABDOMEN PELVIS FINDINGS Hepatobiliary: Small area of focal fat deposition adjacent to the falciform ligament in the medial segment of the left lobe of the liver. Mildly dilated gallbladder. No gallbladder wall thickening or pericholecystic fluid and no visible gallstones. Pancreas: Unremarkable. No pancreatic ductal dilatation or surrounding inflammatory changes. Spleen: Normal in size without focal abnormality. Adrenals/Urinary Tract: Tiny upper pole left renal calculus. The previously seen right renal calculi are no longer demonstrated. No bladder or ureteral calculi and no hydronephrosis. Unremarkable adrenal glands. Stomach/Bowel: Diffuse wall thickening involving the esophageal ampulla or a small hiatal hernia with a maximum thickness of 9 mm on image number 54 series 2. Unremarkable small bowel, colon and appendix. Vascular/Lymphatic: Atheromatous arterial calcifications without aneurysm. No enlarged lymph nodes. Reproductive: Uterus and bilateral adnexa are unremarkable. Other: No abdominal wall hernia or abnormality. No abdominopelvic ascites. Musculoskeletal: Lumbar spine degenerative changes. IMPRESSION: 1. Diffuse wall thickening involving the esophageal ampulla or a small hiatal hernia with a maximum thickness of 9 mm. This could be due to inflammation or a mass. Correlation with upper endoscopy is recommended. 2. 4 mm subpleural right lower lobe nodule. No follow-up needed if patient is low-risk. Non-contrast chest CT can be considered in 12 months if patient is high-risk. This recommendation follows the consensus statement: Guidelines for Management of Incidental Pulmonary Nodules Detected on CT Images: From the Fleischner  Society 2017; Radiology 2017; 284:228-243. 3. Tiny, nonobstructing upper pole left renal calculus. Aortic Atherosclerosis (ICD10-I70.0). Electronically Signed   By: Beckie Salts M.D.   On: 02/02/2019 17:36   Mr Mrv Head Wo Cm  Result Date: 02/02/2019 CLINICAL DATA:  58 y/o F; swelling to right eye, tongue, and right hand. Allergic reaction. EXAM: MR VENOGRAM the HEAD WITHOUT CONTRAST TECHNIQUE: Angiographic images of the intracranial venous structures were obtained using MRV technique without intravenous contrast. COMPARISON:  None. FINDINGS: No flow related signal within the left transverse and sigmoid sinus drainage system. Patent superior sagittal sinus, straight sinus, right transverse sinus, and right sigmoid sinus. The right transverse and sigmoid sinus drainage system is large in caliber. IMPRESSION: No flow related signal in the left transverse and sigmoid dural venous sinuses. Large caliber right-sided transverse drainage system. Findings likely likely represent variant anatomy. Thrombosis is considered unlikely. If clinically indicated this can be confirmed with CT or MRV with contrast. If the patient can not received contrast, then MRI of the head without contrast including thin (eg. FIESTA/CISS) sections may be useful. Electronically Signed   By: Mitzi Hansen M.D.   On: 02/02/2019 19:21   Vas [place] Upper Extremity Venous Duplex  Result Date: 02/02/2019 UPPER VENOUS STUDY  Indications: Allergic reaction causing arm, face, and tongue swelling Performing Technologist: Jeb Levering RDMS, RVT  Examination Guidelines: A complete evaluation includes B-mode imaging, spectral Doppler, color Doppler, and power Doppler as needed of all accessible portions of each vessel. Bilateral testing is considered an integral part of a complete examination. Limited examinations for reoccurring indications may be performed as noted.  Right Findings:  +----------+------------+----------+---------+-----------+-------+ RIGHT     CompressiblePropertiesPhasicitySpontaneousSummary +----------+------------+----------+---------+-----------+-------+ IJV           Full                 Yes       Yes            +----------+------------+----------+---------+-----------+-------+ Subclavian                         Yes       Yes            +----------+------------+----------+---------+-----------+-------+ Axillary                           Yes       Yes            +----------+------------+----------+---------+-----------+-------+ Brachial      Full                 Yes       Yes            +----------+------------+----------+---------+-----------+-------+ Radial        Full                                          +----------+------------+----------+---------+-----------+-------+ Ulnar         Full                                          +----------+------------+----------+---------+-----------+-------+ Cephalic      Full                                          +----------+------------+----------+---------+-----------+-------+  Basilic       Full                                          +----------+------------+----------+---------+-----------+-------+  Left Findings: +----------+------------+----------+---------+-----------+-------+ LEFT      CompressiblePropertiesPhasicitySpontaneousSummary +----------+------------+----------+---------+-----------+-------+ IJV           Full                 Yes       Yes            +----------+------------+----------+---------+-----------+-------+ Subclavian                         Yes       Yes            +----------+------------+----------+---------+-----------+-------+ Axillary                           Yes       Yes            +----------+------------+----------+---------+-----------+-------+ Brachial      Full                 Yes       Yes             +----------+------------+----------+---------+-----------+-------+ Radial        Full                                          +----------+------------+----------+---------+-----------+-------+ Ulnar         Full                                          +----------+------------+----------+---------+-----------+-------+ Cephalic      Full                                          +----------+------------+----------+---------+-----------+-------+ Basilic       Full                                          +----------+------------+----------+---------+-----------+-------+  Summary: No evidence of deep vein or superficial vein thrombosis involving the right and left upper extremities.  *See table(s) above for measurements and observations.    Preliminary     Labs: BMET Recent Labs  Lab 02/02/19 1204 02/02/19 1321 02/03/19 0428  NA 133* 131* 131*  K 3.2* 3.1* 3.5  CL 102  --  97*  CO2 14*  --  20*  GLUCOSE 137*  --  129*  BUN 42*  --  43*  CREATININE 2.03*  --  1.40*  CALCIUM 8.3*  --  7.3*  PHOS  --   --  2.5   CBC Recent Labs  Lab 02/02/19 1204 02/02/19 1252 02/02/19 1321 02/03/19 0428  WBC 4.0  --   --  4.2  NEUTROABS 3.0  --   --   --   HGB 16.6*  --  17.0* 13.9  HCT 48.7*  --  50.0* 39.2  MCV 87.6  --   --  83.6  PLT 64* 65*  --  29*    Medications:    . sodium chloride   Intravenous Once  . pantoprazole (PROTONIX) IV  40 mg Intravenous Q24H      Bufford ButtnerElizabeth Teren Franckowiak, MD [facility] pgr [phone] 02/03/2019, 9:59 AM

## 2019-02-03 NOTE — Progress Notes (Signed)
NAME:  Sarah Fry, MRN:  [id number], DOB:  [birth year], LOS: 1 ADMISSION DATE:  02/02/2019, CONSULTATION DATE:  02/02/19 REFERRING MD:  Dr. Criss Alvine, CHIEF COMPLAINT:  Eye, tongue swelling.     Brief History   58 y/o F who presented to [facility] on 2/3 with reports of right eye, tongue and right hand swelling.     History of present illness   58 y/o F who presented to [facility] on 2/3 with reports of waking at 0300 with right eye, tongue and right hand swelling.    At baseline she is a tobacco user (began at age 22, smokes 1 pack/day).  Additionally she also uses occasional marijuana.  No IV drug abuse or narcotics.  Patient recently traveled to [place] with her son.  They were there for 5 days and did extensive walking.  She states that her legs were fatigued and achy during the trip but thought that it was related to the extent of how much they had walked.  She denies new exposures or medications.  Reports that they were very careful with hand hygiene while in [place].    States she has had some diarrhea but no other infectious symptoms to include fever, chills, nausea vomiting.  Reports mild shortness of breath, right-sided facial swelling, upper extremity swelling.  Has an allergy to flexeril and "pain medications" but has not taken them. Used an EPI pen at 1000 am with no relief of symptoms.    She presented to [facility] ER with right sided facial swelling, right hand swelling and tongue swelling.  Hemodynamically stable / actually hypertensive & tachycardic.  Initial labs notable for Na 133, K 3.2, CO2 14, glucose 137, BUN 42, sr cr 2.03, AG 17, albumin 3.4, lactic acid 4.1, WBC 4, Hgb 16.6, and platelets 64.    PCCM consulted for evaluation.   Past Medical History  Kidney Stones Tobacco Abuse   Significant Hospital Events   2/03  Admit with R eye, tongue & R hand swelling   Consults:  PCCM  [facility]eme Vascular Surgery Nephrology   Procedures:    Significant Diagnostic Tests:  CT  Chest / ABD / Pelvis 2/3 >> no cardiomegaly, mild aortic calcifications, small 7mm lung nodule RLL, C  T and L spine degenerative changes, no gallstones, mild gallbladder dilation, small L renal calculus, no hydronephrosis, wall thickening of esophageal ampulla  UE Venous Duplex 2/3 >> negative  MRV non-con 2/3>> no flow related signal in L transverse and sigmoid venous sinuses. Read as likely anatomical variant   Significant labs: 2/3: Lactate 4.1, WBC 4, Hgb 16.6, Plt 64 (2/4 decrease to 29), No schistocytes on smear, DDimer 9.59, Fibrinogen>800, INR 1.2, PT 14.3, APTT 29, LDH 300, CK 503  Micro Data:  BCx2 2/3 >>  UA 2/3 >>   Antimicrobials:     Interim history/subjective:  Interval decrease in plt to 29, plt transfusion ordered   Objective   Blood pressure (!) 163/96, pulse (!) 115, temperature 98.2 F (36.8 C), temperature source Oral, resp. rate (!) 26, weight 53 kg, SpO2 98 %.        Intake/Output Summary (Last 24 hours) at 02/03/2019 0817 Last data filed at 02/03/2019 0000 Gross per 24 hour  Intake 1400 ml  Output 700 ml  Net 700 ml   Filed Weights   02/03/19 0436  Weight: 53 kg    Examination: General: WDWN adult female in bed, NAD HEENT: pink mmm. Slightly edematous tongue with dark round lesions.  Bilateral facial swelling, bilateral eyelid swelling  CV: Tachycardic. s2s1 appreciated, no r/g/m. 2+ radial pulses 2+ pedal pulses  PULM: CTA bilaterally, no stridor, no wheezing. No accessory muscle recruitment  GI: soft, round, ndnt normoactive x4  Extremities:  BUE edema, R>L. Soft compartments, pulses intact, sensation intact.  Skin: clean, dry warm        Resolved Hospital Problem list      Assessment & Plan:   Facial and Upper extremity swelling  -presented with AKI, thrombocytopenia.  R/O HUS, atypical HUS, TTP, chest mass with obstructive physiology / SVC syndrome P: Assess CT Chest, ABD, Pelvis  [facility], concern for  UE swelling > if intervention needed would require Hand/Ortho  Hematology consulted Follow up echo (history of septal defect), trops   Thrombocytopenia  -R/O HUS, atypical HUS, TTP -Plt 2/4 decrease to 29 -LDH 300, CK 503, DDimer 9.59, Fibrinogen > 800 P: -hematology consulted, low suspicion for TTP, HUS. Consider hereditary angioedema  Follow up ADAMS 13, Shigella toxin, C3,C4, ANA, SCL-70, CK, Haptoglobin, peripheral smear & DIC panel  Trend CBC 2Plt ordered 2/4 for plt 29  Minimize labs, blood draws   Acute Kidney Injury AGMA / Lactic Acidosis  -BUN/Cr ratio consistent with pre-renal failure -Small L renal calculus -Improving Cr 2/4 to 1.40  P: Nephrology consulted, started bicarb gtt  Trend BMP, UOP Avoid nephrotoxic agents  Send urine sodium, urine creatinine  Follow lactate   Electrolyte abnormalities -Hyponatremia  -Hypokalemia  P: Replace electrolytes PRN    Best practice:  Diet: NPO  Pain/Anxiety/Delirium protocol (if indicated): n/a VAP protocol (if indicated): n/a, aspiration precautions  DVT prophylaxis: SCD's  GI prophylaxis: protonix Glucose control: n/a  Mobility: as tolerated  Code Status: Full Code  Family Communication: Patient and son at bedside Disposition: Continue ICU level of care   Labs   CBC: Recent Labs  Lab 02/02/19 1204 02/02/19 1252 02/02/19 1321 02/03/19 0428  WBC 4.0  --   --  4.2  NEUTROABS 3.0  --   --   --   HGB 16.6*  --  17.0* 13.9  HCT 48.7*  --  50.0* 39.2  MCV 87.6  --   --  83.6  PLT 64* 65*  --  29*    Basic Metabolic Panel: Recent Labs  Lab 02/02/19 1204 02/02/19 1321 02/03/19 0428  NA 133* 131* 131*  K 3.2* 3.1* 3.5  CL 102  --  97*  CO2 14*  --  20*  GLUCOSE 137*  --  129*  BUN 42*  --  43*  CREATININE 2.03*  --  1.40*  CALCIUM 8.3*  --  7.3*  MG  --   --  2.1  PHOS  --   --  2.5   GFR: CrCl cannot be calculated (Unknown ideal weight.). Recent Labs  Lab 02/02/19 1204 02/02/19 1310  02/02/19 1507 02/03/19 0428  WBC 4.0  --   --  4.2  LATICACIDVEN  --  4.1* 4.4*  --     Liver Function Tests: Recent Labs  Lab 02/02/19 1204  AST 48*  ALT 29  ALKPHOS 90  BILITOT 0.5  PROT 6.9  ALBUMIN 3.4*   No results for input(s): LIPASE, AMYLASE in the last 168 hours. No results for input(s): AMMONIA in the last 168 hours.  ABG    Component Value Date/Time   HCO3 14.2 (L) 02/02/2019 1321   TCO2 15 (L) 02/02/2019 1321   ACIDBASEDEF 11.0 (H)  02/02/2019 1321   O2SAT 78.0 02/02/2019 1321     Coagulation Profile: Recent Labs  Lab 02/02/19 1252  INR 1.12    Cardiac Enzymes: Recent Labs  Lab 02/02/19 1507  CKTOTAL 503*    HbA1C: No results found for: HGBA1C  CBG: No results for input(s): GLUCAP in the last 168 hours.   Critical care time: 40 min       Tessie Fass MSN, AGACNP-BC [facility] Pulmonary/Critical Care Medicine 02/03/2019, 8:17 AM

## 2019-02-03 NOTE — Progress Notes (Signed)
   02/03/19 1450  Vitals  Pulse Rate (!) 160  ECG Heart Rate (!) 159  Resp (!) 31  Oxygen Therapy  SpO2 91 %  Patient shivering temp 99.0 F oral provider notified   3:55- CRITICAL VALUE: lactic acid 2.3

## 2019-02-03 NOTE — Progress Notes (Signed)
CRITICAL VALUE ALERT  Critical Value:  Lactic 3.4  Date & Time Notied:  02/03/19 1953  Provider Notified: Pola Corn  Orders Received/Actions taken: Awaiting orders

## 2019-02-04 LAB — BLOOD CULTURE ID PANEL (REFLEXED)

## 2019-02-04 LAB — CBC WITH DIFFERENTIAL/PLATELET
Abs Immature Granulocytes: 0.04 10*3/uL (ref 0.00–0.07)
Basophils Absolute: 0 10*3/uL (ref 0.0–0.1)
Basophils Relative: 0 %
Eosinophils Absolute: 0 10*3/uL (ref 0.0–0.5)
Eosinophils Relative: 0 %
HEMATOCRIT: 31.7 % — AB (ref 36.0–46.0)
Hemoglobin: 11.4 g/dL — ABNORMAL LOW (ref 12.0–15.0)
Immature Granulocytes: 1 %
LYMPHS ABS: 0.6 10*3/uL — AB (ref 0.7–4.0)
Lymphocytes Relative: 17 %
MCH: 30.3 pg (ref 26.0–34.0)
MCHC: 36 g/dL (ref 30.0–36.0)
MCV: 84.3 fL (ref 80.0–100.0)
Monocytes Absolute: 0.1 10*3/uL (ref 0.1–1.0)
Monocytes Relative: 3 %
Neutro Abs: 2.9 10*3/uL (ref 1.7–7.7)
Neutrophils Relative %: 79 %
Platelets: 52 10*3/uL — ABNORMAL LOW (ref 150–400)
RBC: 3.76 MIL/uL — ABNORMAL LOW (ref 3.87–5.11)
RDW: 13.8 % (ref 11.5–15.5)
WBC: 3.7 10*3/uL — ABNORMAL LOW (ref 4.0–10.5)
nRBC: 0 % (ref 0.0–0.2)

## 2019-02-04 LAB — COMPREHENSIVE METABOLIC PANEL
ALBUMIN: 1.9 g/dL — AB (ref 3.5–5.0)
ALT: 29 U/L (ref 0–44)
AST: 51 U/L — ABNORMAL HIGH (ref 15–41)
Alkaline Phosphatase: 70 U/L (ref 38–126)
Anion gap: 12 (ref 5–15)
BILIRUBIN TOTAL: 0.5 mg/dL (ref 0.3–1.2)
BUN: 21 mg/dL — AB (ref 6–20)
CO2: 35 mmol/L — ABNORMAL HIGH (ref 22–32)
Calcium: 7.3 mg/dL — ABNORMAL LOW (ref 8.9–10.3)
Chloride: 93 mmol/L — ABNORMAL LOW (ref 98–111)
Creatinine, Ser: 0.77 mg/dL (ref 0.44–1.00)
GFR calc Af Amer: >60 mL/min (ref >60)
GFR calc non Af Amer: >60 mL/min (ref >60)
Glucose, Bld: 116 mg/dL — ABNORMAL HIGH (ref 70–99)
Potassium: 2.4 mmol/L — CL (ref 3.5–5.1)
Sodium: 140 mmol/L (ref 135–145)
Total Protein: 4.7 g/dL — ABNORMAL LOW (ref 6.5–8.1)

## 2019-02-04 LAB — CK: Total CK: 261 U/L — ABNORMAL HIGH (ref 38–234)

## 2019-02-04 LAB — SAVE SMEAR(SSMR), FOR PROVIDER SLIDE REVIEW

## 2019-02-04 LAB — PREPARE PLATELET PHERESIS
Unit division: 0
Unit division: 0

## 2019-02-04 LAB — COMPLEMENT, TOTAL
Compl, Total (CH50): 19 U/mL — ABNORMAL LOW (ref 42–999999)
Compl, Total (CH50): 16 U/mL — ABNORMAL LOW (ref 42–999999)

## 2019-02-04 LAB — BPAM PLATELET PHERESIS
Blood Product Expiration Date: 202002042359
Blood Product Expiration Date: 202002062359
ISSUE DATE / TIME: 202002041058
ISSUE DATE / TIME: 202002041215
Unit Type and Rh: 6200
Unit Type and Rh: 6200

## 2019-02-04 LAB — ANTI-SCLERODERMA ANTIBODY: Scleroderma (Scl-70) (ENA) Antibody, IgG: <0.2 AI (ref 0.0–0.9)

## 2019-02-04 LAB — TROPONIN I: Troponin I: <0.03 ng/mL (ref <0.03)

## 2019-02-04 LAB — ANTINUCLEAR ANTIBODIES, IFA: ANA Ab, IFA: POSITIVE — AB

## 2019-02-04 LAB — C1 ESTERASE INHIBITOR: C1INH SerPl-mCnc: 36 mg/dL (ref 21–39)

## 2019-02-04 LAB — FANA STAINING PATTERNS: Homogeneous Pattern: 1:80 {titer}

## 2019-02-04 MED ORDER — OXYCODONE-ACETAMINOPHEN 5-325 MG PO TABS
1.0000 | ORAL_TABLET | ORAL | Status: DC | PRN
  Administered 2019-02-04 – 2019-02-05 (×2): 1 via ORAL
  Filled 2019-02-04 (×2): qty 1

## 2019-02-04 MED ORDER — SENNOSIDES-DOCUSATE SODIUM 8.6-50 MG PO TABS
1.0000 | ORAL_TABLET | Freq: Every day | ORAL | Status: DC
  Administered 2019-02-04: 1 via ORAL
  Filled 2019-02-04 (×2): qty 1

## 2019-02-04 MED ORDER — POLYETHYLENE GLYCOL 3350 17 G PO PACK
17.0000 g | PACK | Freq: Two times a day (BID) | ORAL | Status: DC
  Administered 2019-02-04 – 2019-02-06 (×4): 17 g via ORAL
  Filled 2019-02-04 (×5): qty 1

## 2019-02-04 MED ORDER — POTASSIUM CHLORIDE 10 MEQ/100ML IV SOLN
10.0000 meq | INTRAVENOUS | Status: AC
  Administered 2019-02-04 (×4): 10 meq via INTRAVENOUS
  Filled 2019-02-04 (×4): qty 100

## 2019-02-04 MED ORDER — SODIUM CHLORIDE 0.9 % IV SOLN
2.0000 g | Freq: Two times a day (BID) | INTRAVENOUS | Status: DC
  Administered 2019-02-04: 2 g via INTRAVENOUS
  Filled 2019-02-04 (×2): qty 2

## 2019-02-04 MED ORDER — LIDOCAINE VISCOUS HCL 2 % MT SOLN
15.0000 mL | OROMUCOSAL | Status: DC | PRN
  Administered 2019-02-04: 15 mL via OROMUCOSAL
  Filled 2019-02-04 (×3): qty 15

## 2019-02-04 MED ORDER — BISACODYL 5 MG PO TBEC
5.0000 mg | DELAYED_RELEASE_TABLET | Freq: Every day | ORAL | Status: DC | PRN
  Administered 2019-02-04: 5 mg via ORAL
  Filled 2019-02-04: qty 1

## 2019-02-04 MED ORDER — POTASSIUM CHLORIDE 20 MEQ PO PACK
40.0000 meq | PACK | Freq: Two times a day (BID) | ORAL | Status: DC
  Administered 2019-02-04 – 2019-02-07 (×5): 40 meq via ORAL
  Filled 2019-02-04 (×8): qty 2

## 2019-02-04 MED ORDER — WHITE PETROLATUM EX OINT
TOPICAL_OINTMENT | CUTANEOUS | Status: AC
  Administered 2019-02-04: 0.2
  Filled 2019-02-04: qty 28.35

## 2019-02-04 MED ORDER — SODIUM CHLORIDE 0.9 % IV SOLN
INTRAVENOUS | Status: DC
  Administered 2019-02-04: 19:00:00 via INTRAVENOUS

## 2019-02-04 NOTE — Progress Notes (Signed)
Pharmacy Antibiotic Note  Sarah Fry is a 58 y.o. female admitted on 02/02/2019 with GNR bacteremia.  Pharmacy has been consulted for Cefepime dosing.  Plan: Cefepime 2 gm IV q12h Will f/u micro data, renal function, and pt's clinical condition  Height: 5\' 5"  (165.1 cm) Weight: 116 lb 13.5 oz (53 kg) IBW/kg (Calculated) : 57  Temp (24hrs), Avg:99.2 F (37.3 C), Min:98.7 F (37.1 C), Max:100.1 F (37.8 C)  Recent Labs  Lab 02/02/19 1204 02/02/19 1310 02/02/19 1507 02/03/19 0428 02/03/19 1517 02/03/19 1847 02/03/19 2313 02/04/19 0501 02/04/19 1024  WBC 4.0  --   --  4.2  --  3.5*  --  3.7*  --   CREATININE 2.03*  --   --  1.40*  --   --   --   --  0.77  LATICACIDVEN  --  4.1* 4.4*  --  2.3* 3.4* 1.2  --   --     Estimated Creatinine Clearance: 64.9 mL/min (by C-G formula based on SCr of 0.77 mg/dL).    Allergies  Allergen Reactions  . Flexeril [Cyclobenzaprine] Anaphylaxis    Antimicrobials this admission: 2/5 Cefepime >>   Microbiology results: 2/5 GI PCR: 2/4 BCx: BCID - Enterobacteriaceae species 2/3 BCx: ngtd 2/3 MRSA PCR: negative   PHARMACY - PHYSICIAN COMMUNICATION CRITICAL VALUE ALERT - BLOOD CULTURE IDENTIFICATION (BCID)  Assessment:  Enterobacteriaceae species in blood cultures (unknown source)  Name of physician (or Provider) Contacted: Dr. Warrick Parisian  Current antibiotics: None  Changes to prescribed antibiotics recommended:  Cefepime per pharmacy  Results for orders placed or performed during the hospital encounter of 02/02/19  Blood Culture ID Panel (Reflexed) (Collected: 02/03/2019  4:12 PM)  Result Value Ref Range   Enterococcus species NOT DETECTED NOT DETECTED   Listeria monocytogenes NOT DETECTED NOT DETECTED   Staphylococcus species NOT DETECTED NOT DETECTED   Staphylococcus aureus (BCID) NOT DETECTED NOT DETECTED   Streptococcus species NOT DETECTED NOT DETECTED   Streptococcus agalactiae NOT DETECTED NOT DETECTED   Streptococcus  pneumoniae NOT DETECTED NOT DETECTED   Streptococcus pyogenes NOT DETECTED NOT DETECTED   Acinetobacter baumannii NOT DETECTED NOT DETECTED   Enterobacteriaceae species DETECTED (A) NOT DETECTED   Enterobacter cloacae complex NOT DETECTED NOT DETECTED   Escherichia coli NOT DETECTED NOT DETECTED   Klebsiella oxytoca NOT DETECTED NOT DETECTED   Klebsiella pneumoniae NOT DETECTED NOT DETECTED   Proteus species NOT DETECTED NOT DETECTED   Serratia marcescens NOT DETECTED NOT DETECTED   Carbapenem resistance NOT DETECTED NOT DETECTED   Haemophilus influenzae NOT DETECTED NOT DETECTED   Neisseria meningitidis NOT DETECTED NOT DETECTED   Pseudomonas aeruginosa NOT DETECTED NOT DETECTED   Candida albicans NOT DETECTED NOT DETECTED   Candida glabrata NOT DETECTED NOT DETECTED   Candida krusei NOT DETECTED NOT DETECTED   Candida parapsilosis NOT DETECTED NOT DETECTED   Candida tropicalis NOT DETECTED NOT DETECTED    Christoper Fabian, PharmD, BCPS Clinical pharmacist  **Pharmacist phone directory can now be found on [email or web address] ([email or web address]).  Listed under [facility]. 02/04/2019  10:31 PM

## 2019-02-04 NOTE — H&P (View-Only) (Signed)
Referring Provider: Dr. Mannam Primary Care Physician:  Stoneking, Hal, MD Primary Gastroenterologist:  Dr. Johnson  Reason for Consultation:  Abnormal CT (esophageal wall thickening); Abdominal pain  HPI: Sarah Fry is a 57 y.o. female admitted for thrombocytopenia and acute renal failure seen for a consult due to a noncontrast chest CT that showed diffuse wall thickening of the distal esophagus. Prior to admit reports that she has been able to eat and drink without any problems. She denies dysphagia, heartburn or weight loss prior to admit. She is having abdominal pain and has not had a BM since last Thursday. Platelets 29 on admit and now 52. Son at bedside.  Past Medical History:  Diagnosis Date  . Kidney stones     History reviewed. No pertinent surgical history.  Prior to Admission medications   Medication Sig Start Date End Date Taking? Authorizing Provider  acetaminophen (TYLENOL) 325 MG tablet Take 1,300 mg by mouth every 6 (six) hours as needed for mild pain, fever or headache.   Yes [provider]  ibuprofen (ADVIL,MOTRIN) 200 MG tablet Take 600 mg by mouth every 6 (six) hours as needed for fever, mild pain or moderate pain.   Yes [provider]    Scheduled Meds: . chlorhexidine  15 mL Mouth Rinse BID  . mouth rinse  15 mL Mouth Rinse q12n4p  . nicotine  14 mg Transdermal Daily  . pantoprazole (PROTONIX) IV  40 mg Intravenous Q24H  . potassium chloride  40 mEq Oral BID  . senna-docusate  1 tablet Oral QHS   Continuous Infusions: . potassium chloride 100 mL/hr at 02/04/19 1200   PRN Meds:.acetaminophen, bisacodyl, iohexol, lidocaine, ondansetron (ZOFRAN) IV, oxyCODONE-acetaminophen, phenol  Allergies as of 02/02/2019 - Review Complete 02/02/2019  Allergen Reaction Noted  . Flexeril [cyclobenzaprine] Anaphylaxis 02/02/2019    History reviewed. No pertinent family history.  Social History   Socioeconomic History  . Marital status: Single     Spouse name: Not on file  . Number of children: Not on file  . Years of education: Not on file  . Highest education level: Not on file  Occupational History  . Not on file  Social Needs  . Financial resource strain: Not on file  . Food insecurity:    Worry: Not on file    Inability: Not on file  . Transportation needs:    Medical: Not on file    Non-medical: Not on file  Tobacco Use  . Smoking status: Current Every Day Smoker  . Smokeless tobacco: Never Used  Substance and Sexual Activity  . Alcohol use: No  . Drug use: No  . Sexual activity: Not on file  Lifestyle  . Physical activity:    Days per week: Not on file    Minutes per session: Not on file  . Stress: Not on file  Relationships  . Social connections:    Talks on phone: Not on file    Gets together: Not on file    Attends religious service: Not on file    Active member of club or organization: Not on file    Attends meetings of clubs or organizations: Not on file    Relationship status: Not on file  . Intimate partner violence:    Fear of current or ex partner: Not on file    Emotionally abused: Not on file    Physically abused: Not on file    Forced sexual activity: Not on file  Other Topics Concern  .   Not on file  Social History Narrative  . Not on file    Review of Systems: All negative except as stated above in HPI.  Physical Exam: Vital signs: Vitals:   02/04/19 1100 02/04/19 1200  BP: 123/69 123/83  Pulse: 98 (!) 101  Resp: (!) 21 20  Temp:    SpO2: 96% 97%  T 99.5  Last BM Date: 02/02/19 General:   Lethargic, ill-appearing, thin, no acute distress Head: normocephalic, atraumatic Eyes: anicteric sclera ENT: oropharynx clear Neck: supple, nontender Lungs:  Clear throughout to auscultation.   No wheezes, crackles, or rhonchi. No acute distress. Heart:  Regular rate and rhythm; no murmurs, clicks, rubs,  or gallops. Abdomen: diffuse tenderness with guarding, soft, nondistended, +BS   Rectal:  Deferred Ext: no edema  GI:  Lab Results: Recent Labs    02/03/19 0428 02/03/19 1847 02/04/19 0501  WBC 4.2 3.5* 3.7*  HGB 13.9 11.3* 11.4*  HCT 39.2 31.7* 31.7*  PLT 29* 49* 52*   BMET Recent Labs    02/02/19 1204 02/02/19 1321 02/03/19 0428 02/04/19 1024  NA 133* 131* 131* 140  K 3.2* 3.1* 3.5 2.4*  CL 102  --  97* 93*  CO2 14*  --  20* 35*  GLUCOSE 137*  --  129* 116*  BUN 42*  --  43* 21*  CREATININE 2.03*  --  1.40* 0.77  CALCIUM 8.3*  --  7.3* 7.3*   LFT Recent Labs    02/04/19 1024  PROT 4.7*  ALBUMIN 1.9*  AST 51*  ALT 29  ALKPHOS 70  BILITOT 0.5   PT/INR Recent Labs    02/02/19 1252  LABPROT 14.3  INR 1.12     Studies/Results: Ct Abdomen Pelvis Wo Contrast  Result Date: 02/02/2019 CLINICAL DATA:  Diarrhea. Acute kidney injury. Abdominal distension. Some shortness of breath. Atraumatic swelling of her right eyelid and both hands at 5 a.m. today with associated tongue swelling. She injected herself with an EpiPen at that time. EXAM: CT CHEST, ABDOMEN AND PELVIS WITHOUT CONTRAST TECHNIQUE: Multidetector CT imaging of the chest, abdomen and pelvis was performed following the standard protocol without IV contrast. COMPARISON:  Abdomen and pelvis CT dated 11/21/2017. FINDINGS: CT CHEST FINDINGS Cardiovascular: Mild atheromatous aortic calcifications. Normal sized heart. Minimal pericardial effusion with a maximum thickness of 7 mm. Mediastinum/Nodes: No enlarged mediastinal, hilar, or axillary lymph nodes. Thyroid gland, trachea, and esophagus demonstrate no significant findings. Lungs/Pleura: 4 mm subpleural right lower lobe nodule on image number 111 of series 6., not previously included. No other lung nodules. No pleural fluid or airspace consolidation. Musculoskeletal: Thoracic and lower cervical spine degenerative changes. CT ABDOMEN PELVIS FINDINGS Hepatobiliary: Small area of focal fat deposition adjacent to the falciform ligament in the  medial segment of the left lobe of the liver. Mildly dilated gallbladder. No gallbladder wall thickening or pericholecystic fluid and no visible gallstones. Pancreas: Unremarkable. No pancreatic ductal dilatation or surrounding inflammatory changes. Spleen: Normal in size without focal abnormality. Adrenals/Urinary Tract: Tiny upper pole left renal calculus. The previously seen right renal calculi are no longer demonstrated. No bladder or ureteral calculi and no hydronephrosis. Unremarkable adrenal glands. Stomach/Bowel: Diffuse wall thickening involving the esophageal ampulla or a small hiatal hernia with a maximum thickness of 9 mm on image number 54 series 2. Unremarkable small bowel, colon and appendix. Vascular/Lymphatic: Atheromatous arterial calcifications without aneurysm. No enlarged lymph nodes. Reproductive: Uterus and bilateral adnexa are unremarkable. Other: No abdominal wall hernia or abnormality. No abdominopelvic   ascites. Musculoskeletal: Lumbar spine degenerative changes. IMPRESSION: 1. Diffuse wall thickening involving the esophageal ampulla or a small hiatal hernia with a maximum thickness of 9 mm. This could be due to inflammation or a mass. Correlation with upper endoscopy is recommended. 2. 4 mm subpleural right lower lobe nodule. No follow-up needed if patient is low-risk. Non-contrast chest CT can be considered in 12 months if patient is high-risk. This recommendation follows the consensus statement: Guidelines for Management of Incidental Pulmonary Nodules Detected on CT Images: From the Fleischner Society 2017; Radiology 2017; 284:228-243. 3. Tiny, nonobstructing upper pole left renal calculus. Aortic Atherosclerosis (ICD10-I70.0). Electronically Signed   By: Steven  Reid M.D.   On: 02/02/2019 17:36   Ct Chest Wo Contrast  Result Date: 02/02/2019 CLINICAL DATA:  Diarrhea. Acute kidney injury. Abdominal distension. Some shortness of breath. Atraumatic swelling of her right eyelid and both  hands at 5 a.m. today with associated tongue swelling. She injected herself with an EpiPen at that time. EXAM: CT CHEST, ABDOMEN AND PELVIS WITHOUT CONTRAST TECHNIQUE: Multidetector CT imaging of the chest, abdomen and pelvis was performed following the standard protocol without IV contrast. COMPARISON:  Abdomen and pelvis CT dated 11/21/2017. FINDINGS: CT CHEST FINDINGS Cardiovascular: Mild atheromatous aortic calcifications. Normal sized heart. Minimal pericardial effusion with a maximum thickness of 7 mm. Mediastinum/Nodes: No enlarged mediastinal, hilar, or axillary lymph nodes. Thyroid gland, trachea, and esophagus demonstrate no significant findings. Lungs/Pleura: 4 mm subpleural right lower lobe nodule on image number 111 of series 6., not previously included. No other lung nodules. No pleural fluid or airspace consolidation. Musculoskeletal: Thoracic and lower cervical spine degenerative changes. CT ABDOMEN PELVIS FINDINGS Hepatobiliary: Small area of focal fat deposition adjacent to the falciform ligament in the medial segment of the left lobe of the liver. Mildly dilated gallbladder. No gallbladder wall thickening or pericholecystic fluid and no visible gallstones. Pancreas: Unremarkable. No pancreatic ductal dilatation or surrounding inflammatory changes. Spleen: Normal in size without focal abnormality. Adrenals/Urinary Tract: Tiny upper pole left renal calculus. The previously seen right renal calculi are no longer demonstrated. No bladder or ureteral calculi and no hydronephrosis. Unremarkable adrenal glands. Stomach/Bowel: Diffuse wall thickening involving the esophageal ampulla or a small hiatal hernia with a maximum thickness of 9 mm on image number 54 series 2. Unremarkable small bowel, colon and appendix. Vascular/Lymphatic: Atheromatous arterial calcifications without aneurysm. No enlarged lymph nodes. Reproductive: Uterus and bilateral adnexa are unremarkable. Other: No abdominal wall hernia or  abnormality. No abdominopelvic ascites. Musculoskeletal: Lumbar spine degenerative changes. IMPRESSION: 1. Diffuse wall thickening involving the esophageal ampulla or a small hiatal hernia with a maximum thickness of 9 mm. This could be due to inflammation or a mass. Correlation with upper endoscopy is recommended. 2. 4 mm subpleural right lower lobe nodule. No follow-up needed if patient is low-risk. Non-contrast chest CT can be considered in 12 months if patient is high-risk. This recommendation follows the consensus statement: Guidelines for Management of Incidental Pulmonary Nodules Detected on CT Images: From the Fleischner Society 2017; Radiology 2017; 284:228-243. 3. Tiny, nonobstructing upper pole left renal calculus. Aortic Atherosclerosis (ICD10-I70.0). Electronically Signed   By: Steven  Reid M.D.   On: 02/02/2019 17:36     Impression/Plan: 57 yo with diffuse esophageal wall thickening on a noncontrast CT scan that needs to be further evaluated with an EGD. Patient denies dysphagia. Doubt malignancy. May have esophagitis. NPO p MN. If EGD unrevealing, then advance diet tomorrow. Risks/benefits discussed with patient and she agrees to proceed.   Miralax prn for constipation.    LOS: 2 days   Talulah Schirmer C Sahej Hauswirth  02/04/2019, 1:12 PM  Questions please call [phone]  

## 2019-02-04 NOTE — Evaluation (Signed)
Physical Therapy Evaluation Patient Details Name: Sarah Fry MRN: [id number] DOB: [birth year] Today's Date: 02/04/2019   History of Present Illness  Pt is a 58 y.o. female admitted 02/02/19 with right eye, tongue and hand swelling; unclear etiology. Worked up for AKI, mild rhabdomyolysis, thrombocytopenia. CT noted esophageal thickening. PMH includes kidney stones.    Clinical Impression  Pt presents with an overall decrease in functional mobility secondary to above. PTA, pt indep, lives alone and remains active; supportive sons live nearby. Today, pt able to perform ADLs and ambulate with min guard. Expect pt to progress well once pain and swelling better controlled. Will follow acutely to address established goals.  SpO2 down to 82% on RA; RN present to monitor    Follow Up Recommendations No PT follow up;Supervision for mobility/OOB    Equipment Recommendations  (TBD)    Recommendations for Other Services       Precautions / Restrictions Precautions Precautions: Fall Restrictions Weight Bearing Restrictions: No      Mobility  Bed Mobility Overal bed mobility: Modified Independent             General bed mobility comments: Increased effort  Transfers Overall transfer level: Needs assistance Equipment used: None Transfers: Sit to/from Stand Sit to Stand: Min guard            Ambulation/Gait Ambulation/Gait assistance: Min guard Gait Distance (Feet): 100 Feet Assistive device: None Gait Pattern/deviations: Step-through pattern;Decreased stride length Gait velocity: Decreased Gait velocity interpretation: <1.8 ft/sec, indicate of risk for recurrent falls General Gait Details: Slow, slightly unsteady gait without DME; min guard for balance. C/o foot pain. SpO2 down to 82% on RA  Stairs            Wheelchair Mobility    Modified Rankin (Stroke Patients Only)       Balance Overall balance assessment: Needs assistance   Sitting balance-Leahy  Scale: Good       Standing balance-Leahy Scale: Fair                               Pertinent Vitals/Pain Pain Assessment: Faces Faces Pain Scale: Hurts a little bit Pain Location: Tongue Pain Descriptors / Indicators: Grimacing;Discomfort Pain Intervention(s): RN gave pain meds during session;Monitored during session    Home Living Family/patient expects to be discharged to:: Private residence Living Arrangements: Alone Available Help at Discharge: Family;Available PRN/intermittently Type of Home: House Home Access: Stairs to enter     Home Layout: Two level Home Equipment: None Additional Comments: Son lives within walking distance; multiple supportive family/friends nearby    Prior Function Level of Independence: Independent         Comments: Works at home. Drives. [place] walking     Hand Dominance        Extremity/Trunk Assessment   Upper Extremity Assessment Upper Extremity Assessment: (Bilateral hand/lower arm swelling; difficulty with grip/ROM secondary to this)    Lower Extremity Assessment Lower Extremity Assessment: Generalized weakness    Cervical / Trunk Assessment Cervical / Trunk Assessment: Normal  Communication   Communication: No difficulties  Cognition Arousal/Alertness: Awake/alert Behavior During Therapy: WFL for tasks assessed/performed Overall Cognitive Status: Within Functional Limits for tasks assessed  General Comments General comments (skin integrity, edema, etc.): RN/NT present    Exercises     Assessment/Plan    PT Assessment Patient needs continued PT services  PT Problem List Decreased strength;Decreased range of motion;Decreased activity tolerance;Decreased balance;Decreased mobility;Cardiopulmonary status limiting activity       PT Treatment Interventions DME instruction;Gait training;Stair  training;Functional mobility training;Therapeutic activities;Therapeutic exercise;Balance training;Patient/family education    PT Goals (Current goals can be found in the Care Plan section)  Acute Rehab PT Goals Patient Stated Goal: Figure out what is wrong PT Goal Formulation: With patient Time For Goal Achievement: 02/18/19 Potential to Achieve Goals: Good    Frequency Min 3X/week   Barriers to discharge Decreased caregiver support      Co-evaluation               AM-PAC PT "6 Clicks" Mobility  Outcome Measure Help needed turning from your back to your side while in a flat bed without using bedrails?: None Help needed moving from lying on your back to sitting on the side of a flat bed without using bedrails?: None Help needed moving to and from a bed to a chair (including a wheelchair)?: A Little Help needed standing up from a chair using your arms (e.g., wheelchair or bedside chair)?: A Little Help needed to walk in hospital room?: A Little Help needed climbing 3-5 steps with a railing? : A Little 6 Click Score: 20    End of Session Equipment Utilized During Treatment: Gait belt Activity Tolerance: Patient tolerated treatment well Patient left: (using bathroom with RN/NT) Nurse Communication: Mobility status PT Visit Diagnosis: Other abnormalities of gait and mobility (R26.89)    Time: 2446-2863 PT Time Calculation (min) (ACUTE ONLY): 25 min   Charges:   PT Evaluation $PT Eval Moderate Complexity: 1 Mod PT Treatments $Gait Training: 8-22 mins      Ina Homes, PT, DPT Acute Rehabilitation Services  Pager [phone] Office [phone]  Malachy Chamber 02/04/2019, 5:22 PM

## 2019-02-04 NOTE — Progress Notes (Addendum)
NAME:  Sarah Fry, MRN:  [id number], DOB:  07-30-1961, LOS: 2 ADMISSION DATE:  02/02/2019, CONSULTATION DATE:  02/02/19 REFERRING MD:  Dr. Criss Alvine, CHIEF COMPLAINT:  Eye, tongue swelling.     Brief History   58 y/o F who presented to [facility] on 2/3 with reports of right eye, tongue and right hand swelling.     History of present illness   58 y/o F who presented to [facility] on 2/3 with reports of waking at 0300 with right eye, tongue and right hand swelling.    At baseline she is a tobacco user (began at age 18, smokes 1 pack/day).  Additionally she also uses occasional marijuana.  No IV drug abuse or narcotics.  Patient recently traveled to [place] with her son.  They were there for 5 days and did extensive walking.  She states that her legs were fatigued and achy during the trip but thought that it was related to the extent of how much they had walked.  She denies new exposures or medications.  Reports that they were very careful with hand hygiene while in [place].    States she has had some diarrhea but no other infectious symptoms to include fever, chills, nausea vomiting.  Reports mild shortness of breath, right-sided facial swelling, upper extremity swelling.  Has an allergy to flexeril and "pain medications" but has not taken them. Used an EPI pen at 1000 am with no relief of symptoms.    She presented to [facility] ER with right sided facial swelling, right hand swelling and tongue swelling.  Hemodynamically stable / actually hypertensive & tachycardic.  Initial labs notable for Na 133, K 3.2, CO2 14, glucose 137, BUN 42, sr cr 2.03, AG 17, albumin 3.4, lactic acid 4.1, WBC 4, Hgb 16.6, and platelets 64.    PCCM consulted for evaluation.   Past Medical History  Kidney Stones Tobacco Abuse   Significant Hospital Events   2/03  Admit with R eye, tongue & R hand swelling   Consults:  PCCM  [facility]   Procedures:    Significant Diagnostic Tests:  CT  Chest / ABD / Pelvis 2/3 >> no cardiomegaly, mild aortic calcifications, small 4mm lung nodule RLL, C  T and L spine degenerative changes, no gallstones, mild gallbladder dilation, small L renal calculus, no hydronephrosis, wall thickening of esophageal ampulla  UE Venous Duplex 2/3 >> negative  MRV non-con 2/3>> no flow related signal in L transverse and sigmoid venous sinuses. Read as likely anatomical variant   Micro Data:  BCx2 2/3 >>  BCx 2/4 >> UA 2/3 >>   Antimicrobials:     Interim history/subjective:    Objective   Blood pressure (!) 117/59, pulse (!) 103, temperature 99.1 F (37.3 C), temperature source Oral, resp. rate (!) 22, height 5\' 5"  (1.651 m), weight 53 kg, SpO2 96 %.        Intake/Output Summary (Last 24 hours) at 02/04/2019 0934 Last data filed at 02/04/2019 0700 Gross per 24 hour  Intake 3578.81 ml  Output 1750 ml  Net 1828.81 ml   Filed Weights   02/03/19 0436 02/03/19 1235  Weight: 53 kg 53 kg    Examination: Gen:      No acute distress HEENT:  EOMI, sclera anicteric Neck:     No masses; no thyromegaly Lungs:    Clear to auscultation bilaterally; normal respiratory effort CV:         Regular rate and rhythm;  no murmurs Abd:      + bowel sounds; soft, non-tender; no palpable masses, no distension Ext:    No edema; adequate peripheral perfusion Skin:      Warm and dry; no rash Neuro: alert and oriented x 3 Psych: normal mood and affect   Resolved Hospital Problem list      Assessment & Plan:  58 year old admitted with thrombocytopenia, acute renal failure and face, hand swelling of unclear etiology  Thrombocytopenia, AKI, face swelling Constellation of findings with unclear etiology.  There is no clear evidence of DIC or consumptive process such as TTP/HUS with no schistocytes or hemolysis  Adamts 13 activity noted to be low.  Unable to check stool for Shiga toxin as she has not had a bowel movement yet.  Will discuss with hematology if this is  presentation of atypical HUS. Suspect that she may have mild rhabdo with renal failure in the setting of excessive NSAID use during her recent [place], though this would not explain the platelets.   Continue supportive care.  No need for plasmapheresis at present.  Esophageal thickening Noted on CT scan.  Will need EGD at some point when platelets are stabilized.  Acute kidney injury, mild rhabdomyolysis Monitor urine output and creatinine Nephrology on board. Continue bicarb drip.  Best practice:  Diet: P.o. diet Pain/Anxiety/Delirium protocol (if indicated): n/a VAP protocol (if indicated): n/a, aspiration precautions  DVT prophylaxis: SCD's  GI prophylaxis: protonix Glucose control: n/a  Mobility: as tolerated  Code Status: Full Code  Family Communication: Patient and son at bedside Disposition: Continue ICU level of care   Labs   CBC: Recent Labs  Lab 02/02/19 1204 02/02/19 1252 02/02/19 1321 02/03/19 0428 02/03/19 1847 02/04/19 0501  WBC 4.0  --   --  4.2 3.5* 3.7*  NEUTROABS 3.0  --   --   --   --  2.9  HGB 16.6*  --  17.0* 13.9 11.3* 11.4*  HCT 48.7*  --  50.0* 39.2 31.7* 31.7*  MCV 87.6  --   --  83.6 83.2 84.3  PLT 64* 65*  --  29* 49* 52*    Basic Metabolic Panel: Recent Labs  Lab 02/02/19 1204 02/02/19 1321 02/03/19 0428  NA 133* 131* 131*  K 3.2* 3.1* 3.5  CL 102  --  97*  CO2 14*  --  20*  GLUCOSE 137*  --  129*  BUN 42*  --  43*  CREATININE 2.03*  --  1.40*  CALCIUM 8.3*  --  7.3*  MG  --   --  2.1  PHOS  --   --  2.5   GFR: Estimated Creatinine Clearance: 37.1 mL/min (A) (by C-G formula based on SCr of 1.4 mg/dL (H)). Recent Labs  Lab 02/02/19 1204  02/02/19 1507 02/03/19 0428 02/03/19 1517 02/03/19 1847 02/03/19 2313 02/04/19 0501  WBC 4.0  --   --  4.2  --  3.5*  --  3.7*  LATICACIDVEN  --    < > 4.4*  --  2.3* 3.4* 1.2  --    < > = values in this interval not displayed.    Liver Function Tests: Recent Labs    Lab 02/02/19 1204  AST 48*  ALT 29  ALKPHOS 90  BILITOT 0.5  PROT 6.9  ALBUMIN 3.4*   No results for input(s): LIPASE, AMYLASE in the last 168 hours. No results for input(s): AMMONIA in the last 168 hours.  ABG  Component Value Date/Time   HCO3 14.2 (L) 02/02/2019 1321   TCO2 15 (L) 02/02/2019 1321   ACIDBASEDEF 11.0 (H) 02/02/2019 1321   O2SAT 78.0 02/02/2019 1321     Coagulation Profile: Recent Labs  Lab 02/02/19 1252  INR 1.12    Cardiac Enzymes: Recent Labs  Lab 02/02/19 1507 02/03/19 1517 02/03/19 2313  CKTOTAL 503*  --   --   TROPONINI  --  <0.03 <0.03    HbA1C: No results found for: HGBA1C  CBG: No results for input(s): GLUCAP in the last 168 hours.  The patient is critically ill with multiple organ system failure and requires high complexity decision making for assessment and support, frequent evaluation and titration of therapies, advanced monitoring, review of radiographic studies and interpretation of complex data.   Critical Care Time devoted to patient care services, exclusive of separately billable procedures, described in this note is 35 minutes.   Chilton GreathousePraveen Eliese Kerwood MD [facility] Pager [phone] If no answer call [phone] 02/04/2019, 9:55 AM

## 2019-02-04 NOTE — Consult Note (Addendum)
Referring Provider: Dr. Isaiah SergeMannam Primary Care Physician:  Merlene LaughterStoneking, Hal, MD Primary Gastroenterologist:  Dr. Laural BenesJohnson  Reason for Consultation:  Abnormal CT (esophageal wall thickening); Abdominal pain  HPI: Sarah Fry is a 58 y.o. female admitted for thrombocytopenia and acute renal failure seen for a consult due to a noncontrast chest CT that showed diffuse wall thickening of the distal esophagus. Prior to admit reports that she has been able to eat and drink without any problems. She denies dysphagia, heartburn or weight loss prior to admit. She is having abdominal pain and has not had a BM since last Thursday. Platelets 29 on admit and now 52. Son at bedside.  Past Medical History:  Diagnosis Date  . Kidney stones     History reviewed. No pertinent surgical history.  Prior to Admission medications   Medication Sig Start Date End Date Taking? Authorizing Provider  acetaminophen (TYLENOL) 325 MG tablet Take 1,300 mg by mouth every 6 (six) hours as needed for mild pain, fever or headache.   Yes [provider]  ibuprofen (ADVIL,MOTRIN) 200 MG tablet Take 600 mg by mouth every 6 (six) hours as needed for fever, mild pain or moderate pain.   Yes [provider]    Scheduled Meds: . chlorhexidine  15 mL Mouth Rinse BID  . mouth rinse  15 mL Mouth Rinse q12n4p  . nicotine  14 mg Transdermal Daily  . pantoprazole (PROTONIX) IV  40 mg Intravenous Q24H  . potassium chloride  40 mEq Oral BID  . senna-docusate  1 tablet Oral QHS   Continuous Infusions: . potassium chloride 100 mL/hr at 02/04/19 1200   PRN Meds:.acetaminophen, bisacodyl, iohexol, lidocaine, ondansetron (ZOFRAN) IV, oxyCODONE-acetaminophen, phenol  Allergies as of 02/02/2019 - Review Complete 02/02/2019  Allergen Reaction Noted  . Flexeril [cyclobenzaprine] Anaphylaxis 02/02/2019    History reviewed. No pertinent family history.  Social History   Socioeconomic History  . Marital status: Single     Spouse name: Not on file  . Number of children: Not on file  . Years of education: Not on file  . Highest education level: Not on file  Occupational History  . Not on file  Social Needs  . Financial resource strain: Not on file  . Food insecurity:    Worry: Not on file    Inability: Not on file  . Transportation needs:    Medical: Not on file    Non-medical: Not on file  Tobacco Use  . Smoking status: Current Every Day Smoker  . Smokeless tobacco: Never Used  Substance and Sexual Activity  . Alcohol use: No  . Drug use: No  . Sexual activity: Not on file  Lifestyle  . Physical activity:    Days per week: Not on file    Minutes per session: Not on file  . Stress: Not on file  Relationships  . Social connections:    Talks on phone: Not on file    Gets together: Not on file    Attends religious service: Not on file    Active member of club or organization: Not on file    Attends meetings of clubs or organizations: Not on file    Relationship status: Not on file  . Intimate partner violence:    Fear of current or ex partner: Not on file    Emotionally abused: Not on file    Physically abused: Not on file    Forced sexual activity: Not on file  Other Topics Concern  .  Not on file  Social History Narrative  . Not on file    Review of Systems: All negative except as stated above in HPI.  Physical Exam: Vital signs: Vitals:   02/04/19 1100 02/04/19 1200  BP: 123/69 123/83  Pulse: 98 (!) 101  Resp: (!) 21 20  Temp:    SpO2: 96% 97%  T 99.5  Last BM Date: 02/02/19 General:   Lethargic, ill-appearing, thin, no acute distress Head: normocephalic, atraumatic Eyes: anicteric sclera ENT: oropharynx clear Neck: supple, nontender Lungs:  Clear throughout to auscultation.   No wheezes, crackles, or rhonchi. No acute distress. Heart:  Regular rate and rhythm; no murmurs, clicks, rubs,  or gallops. Abdomen: diffuse tenderness with guarding, soft, nondistended, +BS   Rectal:  Deferred Ext: no edema  GI:  Lab Results: Recent Labs    02/03/19 0428 02/03/19 1847 02/04/19 0501  WBC 4.2 3.5* 3.7*  HGB 13.9 11.3* 11.4*  HCT 39.2 31.7* 31.7*  PLT 29* 49* 52*   BMET Recent Labs    02/02/19 1204 02/02/19 1321 02/03/19 0428 02/04/19 1024  NA 133* 131* 131* 140  K 3.2* 3.1* 3.5 2.4*  CL 102  --  97* 93*  CO2 14*  --  20* 35*  GLUCOSE 137*  --  129* 116*  BUN 42*  --  43* 21*  CREATININE 2.03*  --  1.40* 0.77  CALCIUM 8.3*  --  7.3* 7.3*   LFT Recent Labs    02/04/19 1024  PROT 4.7*  ALBUMIN 1.9*  AST 51*  ALT 29  ALKPHOS 70  BILITOT 0.5   PT/INR Recent Labs    02/02/19 1252  LABPROT 14.3  INR 1.12     Studies/Results: Ct Abdomen Pelvis Wo Contrast  Result Date: 02/02/2019 CLINICAL DATA:  Diarrhea. Acute kidney injury. Abdominal distension. Some shortness of breath. Atraumatic swelling of her right eyelid and both hands at 5 a.m. today with associated tongue swelling. She injected herself with an EpiPen at that time. EXAM: CT CHEST, ABDOMEN AND PELVIS WITHOUT CONTRAST TECHNIQUE: Multidetector CT imaging of the chest, abdomen and pelvis was performed following the standard protocol without IV contrast. COMPARISON:  Abdomen and pelvis CT dated 11/21/2017. FINDINGS: CT CHEST FINDINGS Cardiovascular: Mild atheromatous aortic calcifications. Normal sized heart. Minimal pericardial effusion with a maximum thickness of 7 mm. Mediastinum/Nodes: No enlarged mediastinal, hilar, or axillary lymph nodes. Thyroid gland, trachea, and esophagus demonstrate no significant findings. Lungs/Pleura: 4 mm subpleural right lower lobe nodule on image number 111 of series 6., not previously included. No other lung nodules. No pleural fluid or airspace consolidation. Musculoskeletal: Thoracic and lower cervical spine degenerative changes. CT ABDOMEN PELVIS FINDINGS Hepatobiliary: Small area of focal fat deposition adjacent to the falciform ligament in the  medial segment of the left lobe of the liver. Mildly dilated gallbladder. No gallbladder wall thickening or pericholecystic fluid and no visible gallstones. Pancreas: Unremarkable. No pancreatic ductal dilatation or surrounding inflammatory changes. Spleen: Normal in size without focal abnormality. Adrenals/Urinary Tract: Tiny upper pole left renal calculus. The previously seen right renal calculi are no longer demonstrated. No bladder or ureteral calculi and no hydronephrosis. Unremarkable adrenal glands. Stomach/Bowel: Diffuse wall thickening involving the esophageal ampulla or a small hiatal hernia with a maximum thickness of 9 mm on image number 54 series 2. Unremarkable small bowel, colon and appendix. Vascular/Lymphatic: Atheromatous arterial calcifications without aneurysm. No enlarged lymph nodes. Reproductive: Uterus and bilateral adnexa are unremarkable. Other: No abdominal wall hernia or abnormality. No abdominopelvic  ascites. Musculoskeletal: Lumbar spine degenerative changes. IMPRESSION: 1. Diffuse wall thickening involving the esophageal ampulla or a small hiatal hernia with a maximum thickness of 9 mm. This could be due to inflammation or a mass. Correlation with upper endoscopy is recommended. 2. 4 mm subpleural right lower lobe nodule. No follow-up needed if patient is low-risk. Non-contrast chest CT can be considered in 12 months if patient is high-risk. This recommendation follows the consensus statement: Guidelines for Management of Incidental Pulmonary Nodules Detected on CT Images: From the Fleischner Society 2017; Radiology 2017; 284:228-243. 3. Tiny, nonobstructing upper pole left renal calculus. Aortic Atherosclerosis (ICD10-I70.0). Electronically Signed   By: Beckie Salts M.D.   On: 02/02/2019 17:36   Ct Chest Wo Contrast  Result Date: 02/02/2019 CLINICAL DATA:  Diarrhea. Acute kidney injury. Abdominal distension. Some shortness of breath. Atraumatic swelling of her right eyelid and both  hands at 5 a.m. today with associated tongue swelling. She injected herself with an EpiPen at that time. EXAM: CT CHEST, ABDOMEN AND PELVIS WITHOUT CONTRAST TECHNIQUE: Multidetector CT imaging of the chest, abdomen and pelvis was performed following the standard protocol without IV contrast. COMPARISON:  Abdomen and pelvis CT dated 11/21/2017. FINDINGS: CT CHEST FINDINGS Cardiovascular: Mild atheromatous aortic calcifications. Normal sized heart. Minimal pericardial effusion with a maximum thickness of 7 mm. Mediastinum/Nodes: No enlarged mediastinal, hilar, or axillary lymph nodes. Thyroid gland, trachea, and esophagus demonstrate no significant findings. Lungs/Pleura: 4 mm subpleural right lower lobe nodule on image number 111 of series 6., not previously included. No other lung nodules. No pleural fluid or airspace consolidation. Musculoskeletal: Thoracic and lower cervical spine degenerative changes. CT ABDOMEN PELVIS FINDINGS Hepatobiliary: Small area of focal fat deposition adjacent to the falciform ligament in the medial segment of the left lobe of the liver. Mildly dilated gallbladder. No gallbladder wall thickening or pericholecystic fluid and no visible gallstones. Pancreas: Unremarkable. No pancreatic ductal dilatation or surrounding inflammatory changes. Spleen: Normal in size without focal abnormality. Adrenals/Urinary Tract: Tiny upper pole left renal calculus. The previously seen right renal calculi are no longer demonstrated. No bladder or ureteral calculi and no hydronephrosis. Unremarkable adrenal glands. Stomach/Bowel: Diffuse wall thickening involving the esophageal ampulla or a small hiatal hernia with a maximum thickness of 9 mm on image number 54 series 2. Unremarkable small bowel, colon and appendix. Vascular/Lymphatic: Atheromatous arterial calcifications without aneurysm. No enlarged lymph nodes. Reproductive: Uterus and bilateral adnexa are unremarkable. Other: No abdominal wall hernia or  abnormality. No abdominopelvic ascites. Musculoskeletal: Lumbar spine degenerative changes. IMPRESSION: 1. Diffuse wall thickening involving the esophageal ampulla or a small hiatal hernia with a maximum thickness of 9 mm. This could be due to inflammation or a mass. Correlation with upper endoscopy is recommended. 2. 4 mm subpleural right lower lobe nodule. No follow-up needed if patient is low-risk. Non-contrast chest CT can be considered in 12 months if patient is high-risk. This recommendation follows the consensus statement: Guidelines for Management of Incidental Pulmonary Nodules Detected on CT Images: From the Fleischner Society 2017; Radiology 2017; [id number]:228-243. 3. Tiny, nonobstructing upper pole left renal calculus. Aortic Atherosclerosis (ICD10-I70.0). Electronically Signed   By: Beckie Salts M.D.   On: 02/02/2019 17:36     Impression/Plan: 58 yo with diffuse esophageal wall thickening on a noncontrast CT scan that needs to be further evaluated with an EGD. Patient denies dysphagia. Doubt malignancy. May have esophagitis. NPO p MN. If EGD unrevealing, then advance diet tomorrow. Risks/benefits discussed with patient and she agrees to proceed.  Miralax prn for constipation.    LOS: 2 days   [name]  02/04/2019, 1:12 PM  Questions please call [phone]

## 2019-02-04 NOTE — Progress Notes (Addendum)
[facility] Progress Note    Assessment/ Plan:   1.  R face/ tongue/ hand swelling: differential is broad at this time.  EpiPen administered with little effect, not a typical presentation of angioedema- C1esterase is pending.  There are no schistocytes on smear- no MAHA.  ADAMTS13 is 42.5% so TTP not likely.  Labs aren't reflective of HUS either.  Total complement was very low, slightly low C3 and normal C4 which is strange (would typically expect both CH50 and C3 to be quite low).  I'm going to repeat the total complement today.  Lesions on tongue not typical of EM/ DRESS and nothing on mucosal surfaces.  I'll also do a vasculitis workup- see below.  2.  AKI and thrombocytopenia: in addition to above in #1 have sent ANA, HIV (neg), anti-scleroderma antibody. No MAHA. Will also send ANCA, SPEP, serum free light chains.  UA with some blood, UP/C 1.6 g.  CT scan to eval for hydro/ recurrent kidney stones negative for large stones.  Heme also consulted and we appreciate their assistance.  Got plt transfusion for plts 29--> now 52. We may need to consider a kidney biopsy to definitively diagnose TMA--> will discuss with pt; howevere Cr now 0.8 this AM, ? Limited utility  3.  Metabolic acidosis: lactate 4.4.  Will do bicarb gtt, also give supplemental K given K of 3.1  4.  Esophageal thickening- consider GI c/s  5.   Dispo: remains in ICU  Subjective:    Labs pending for today.  Total complement was low, ADAMTS13 42.5 %.  Mouth hurts.     Objective:   BP (!) 117/59   Pulse (!) 103   Temp 99.1 F (37.3 C) (Oral)   Resp (!) 22   Ht 5\' 5"  (1.651 m)   Wt 53 kg   SpO2 96%   BMI 19.44 kg/m   Intake/Output Summary (Last 24 hours) at 02/04/2019 1058 Last data filed at 02/04/2019 0700 Gross per 24 hour  Intake 3378.88 ml  Output 1750 ml  Net 1628.88 ml   Weight change: 0 kg  Physical Exam: GEN appears tired, NAD HEENT R eye improved, L eye EOMI PERRL.  Tongue red and  swollen, not obstructing airway.  1 cm bruising on tongue as well, looks like it's trying to heal NECK no JVD, no overt LAD PULM normal WOB, clear bilaterally no c/w/r CV tachycardic, no m/r/g ABD soft, nontender, nondistended, NABS, no HSM EXT trace LE edema, bilateral UE edema which is improving. NEURO AAO x 3 nonfocal SKIN no rashes   Imaging: Ct Abdomen Pelvis Wo Contrast  Result Date: 02/02/2019 CLINICAL DATA:  Diarrhea. Acute kidney injury. Abdominal distension. Some shortness of breath. Atraumatic swelling of her right eyelid and both hands at 5 a.m. today with associated tongue swelling. She injected herself with an EpiPen at that time. EXAM: CT CHEST, ABDOMEN AND PELVIS WITHOUT CONTRAST TECHNIQUE: Multidetector CT imaging of the chest, abdomen and pelvis was performed following the standard protocol without IV contrast. COMPARISON:  Abdomen and pelvis CT dated 11/21/2017. FINDINGS: CT CHEST FINDINGS Cardiovascular: Mild atheromatous aortic calcifications. Normal sized heart. Minimal pericardial effusion with a maximum thickness of 7 mm. Mediastinum/Nodes: No enlarged mediastinal, hilar, or axillary lymph nodes. Thyroid gland, trachea, and esophagus demonstrate no significant findings. Lungs/Pleura: 4 mm subpleural right lower lobe nodule on image number 111 of series 6., not previously included. No other lung nodules. No pleural fluid or airspace consolidation. Musculoskeletal: Thoracic and lower cervical spine  degenerative changes. CT ABDOMEN PELVIS FINDINGS Hepatobiliary: Small area of focal fat deposition adjacent to the falciform ligament in the medial segment of the left lobe of the liver. Mildly dilated gallbladder. No gallbladder wall thickening or pericholecystic fluid and no visible gallstones. Pancreas: Unremarkable. No pancreatic ductal dilatation or surrounding inflammatory changes. Spleen: Normal in size without focal abnormality. Adrenals/Urinary Tract: Tiny upper pole left renal  calculus. The previously seen right renal calculi are no longer demonstrated. No bladder or ureteral calculi and no hydronephrosis. Unremarkable adrenal glands. Stomach/Bowel: Diffuse wall thickening involving the esophageal ampulla or a small hiatal hernia with a maximum thickness of 9 mm on image number 54 series 2. Unremarkable small bowel, colon and appendix. Vascular/Lymphatic: Atheromatous arterial calcifications without aneurysm. No enlarged lymph nodes. Reproductive: Uterus and bilateral adnexa are unremarkable. Other: No abdominal wall hernia or abnormality. No abdominopelvic ascites. Musculoskeletal: Lumbar spine degenerative changes. IMPRESSION: 1. Diffuse wall thickening involving the esophageal ampulla or a small hiatal hernia with a maximum thickness of 9 mm. This could be due to inflammation or a mass. Correlation with upper endoscopy is recommended. 2. 4 mm subpleural right lower lobe nodule. No follow-up needed if patient is low-risk. Non-contrast chest CT can be considered in 12 months if patient is high-risk. This recommendation follows the consensus statement: Guidelines for Management of Incidental Pulmonary Nodules Detected on CT Images: From the Fleischner Society 2017; Radiology 2017; 284:228-243. 3. Tiny, nonobstructing upper pole left renal calculus. Aortic Atherosclerosis (ICD10-I70.0). Electronically Signed   By: Beckie SaltsSteven  Reid M.D.   On: 02/02/2019 17:36   Ct Chest Wo Contrast  Result Date: 02/02/2019 CLINICAL DATA:  Diarrhea. Acute kidney injury. Abdominal distension. Some shortness of breath. Atraumatic swelling of her right eyelid and both hands at 5 a.m. today with associated tongue swelling. She injected herself with an EpiPen at that time. EXAM: CT CHEST, ABDOMEN AND PELVIS WITHOUT CONTRAST TECHNIQUE: Multidetector CT imaging of the chest, abdomen and pelvis was performed following the standard protocol without IV contrast. COMPARISON:  Abdomen and pelvis CT dated 11/21/2017.  FINDINGS: CT CHEST FINDINGS Cardiovascular: Mild atheromatous aortic calcifications. Normal sized heart. Minimal pericardial effusion with a maximum thickness of 7 mm. Mediastinum/Nodes: No enlarged mediastinal, hilar, or axillary lymph nodes. Thyroid gland, trachea, and esophagus demonstrate no significant findings. Lungs/Pleura: 4 mm subpleural right lower lobe nodule on image number 111 of series 6., not previously included. No other lung nodules. No pleural fluid or airspace consolidation. Musculoskeletal: Thoracic and lower cervical spine degenerative changes. CT ABDOMEN PELVIS FINDINGS Hepatobiliary: Small area of focal fat deposition adjacent to the falciform ligament in the medial segment of the left lobe of the liver. Mildly dilated gallbladder. No gallbladder wall thickening or pericholecystic fluid and no visible gallstones. Pancreas: Unremarkable. No pancreatic ductal dilatation or surrounding inflammatory changes. Spleen: Normal in size without focal abnormality. Adrenals/Urinary Tract: Tiny upper pole left renal calculus. The previously seen right renal calculi are no longer demonstrated. No bladder or ureteral calculi and no hydronephrosis. Unremarkable adrenal glands. Stomach/Bowel: Diffuse wall thickening involving the esophageal ampulla or a small hiatal hernia with a maximum thickness of 9 mm on image number 54 series 2. Unremarkable small bowel, colon and appendix. Vascular/Lymphatic: Atheromatous arterial calcifications without aneurysm. No enlarged lymph nodes. Reproductive: Uterus and bilateral adnexa are unremarkable. Other: No abdominal wall hernia or abnormality. No abdominopelvic ascites. Musculoskeletal: Lumbar spine degenerative changes. IMPRESSION: 1. Diffuse wall thickening involving the esophageal ampulla or a small hiatal hernia with a maximum thickness of 9  mm. This could be due to inflammation or a mass. Correlation with upper endoscopy is recommended. 2. 4 mm subpleural right  lower lobe nodule. No follow-up needed if patient is low-risk. Non-contrast chest CT can be considered in 12 months if patient is high-risk. This recommendation follows the consensus statement: Guidelines for Management of Incidental Pulmonary Nodules Detected on CT Images: From the Fleischner Society 2017; Radiology 2017; 284:228-243. 3. Tiny, nonobstructing upper pole left renal calculus. Aortic Atherosclerosis (ICD10-I70.0). Electronically Signed   By: Beckie Salts M.D.   On: 02/02/2019 17:36   Mr Mrv Head Wo Cm  Result Date: 02/02/2019 CLINICAL DATA:  58 y/o F; swelling to right eye, tongue, and right hand. Allergic reaction. EXAM: MR VENOGRAM the HEAD WITHOUT CONTRAST TECHNIQUE: Angiographic images of the intracranial venous structures were obtained using MRV technique without intravenous contrast. COMPARISON:  None. FINDINGS: No flow related signal within the left transverse and sigmoid sinus drainage system. Patent superior sagittal sinus, straight sinus, right transverse sinus, and right sigmoid sinus. The right transverse and sigmoid sinus drainage system is large in caliber. IMPRESSION: No flow related signal in the left transverse and sigmoid dural venous sinuses. Large caliber right-sided transverse drainage system. Findings likely likely represent variant anatomy. Thrombosis is considered unlikely. If clinically indicated this can be confirmed with CT or MRV with contrast. If the patient can not received contrast, then MRI of the head without contrast including thin (eg. FIESTA/CISS) sections may be useful. Electronically Signed   By: Mitzi Hansen M.D.   On: 02/02/2019 19:21   Vas [place] Upper Extremity Venous Duplex  Result Date: 02/03/2019 UPPER VENOUS STUDY  Indications: Allergic reaction causing arm, face, and tongue swelling Performing Technologist: Jeb Levering RDMS, RVT  Examination Guidelines: A complete evaluation includes B-mode imaging, spectral Doppler, color Doppler, and power  Doppler as needed of all accessible portions of each vessel. Bilateral testing is considered an integral part of a complete examination. Limited examinations for reoccurring indications may be performed as noted.  Right Findings: +----------+------------+----------+---------+-----------+-------+ RIGHT     CompressiblePropertiesPhasicitySpontaneousSummary +----------+------------+----------+---------+-----------+-------+ IJV           Full                 Yes       Yes            +----------+------------+----------+---------+-----------+-------+ Subclavian                         Yes       Yes            +----------+------------+----------+---------+-----------+-------+ Axillary                           Yes       Yes            +----------+------------+----------+---------+-----------+-------+ Brachial      Full                 Yes       Yes            +----------+------------+----------+---------+-----------+-------+ Radial        Full                                          +----------+------------+----------+---------+-----------+-------+ Ulnar         Full                                          +----------+------------+----------+---------+-----------+-------+  Cephalic      Full                                          +----------+------------+----------+---------+-----------+-------+ Basilic       Full                                          +----------+------------+----------+---------+-----------+-------+  Left Findings: +----------+------------+----------+---------+-----------+-------+ LEFT      CompressiblePropertiesPhasicitySpontaneousSummary +----------+------------+----------+---------+-----------+-------+ IJV           Full                 Yes       Yes            +----------+------------+----------+---------+-----------+-------+ Subclavian                         Yes       Yes             +----------+------------+----------+---------+-----------+-------+ Axillary                           Yes       Yes            +----------+------------+----------+---------+-----------+-------+ Brachial      Full                 Yes       Yes            +----------+------------+----------+---------+-----------+-------+ Radial        Full                                          +----------+------------+----------+---------+-----------+-------+ Ulnar         Full                                          +----------+------------+----------+---------+-----------+-------+ Cephalic      Full                                          +----------+------------+----------+---------+-----------+-------+ Basilic       Full                                          +----------+------------+----------+---------+-----------+-------+  Summary: No evidence of deep vein or superficial vein thrombosis involving the right and left upper extremities.  *See table(s) above for measurements and observations.  Diagnosing physician: Fabienne Bruns MD Electronically signed by Fabienne Bruns MD on 02/03/2019 at 10:27:33 AM.    Final     Labs: BMET Recent Labs  Lab 02/02/19 1204 02/02/19 1321 02/03/19 0428  NA 133* 131* 131*  K 3.2* 3.1* 3.5  CL 102  --  97*  CO2 14*  --  20*  GLUCOSE 137*  --  129*  BUN 42*  --  43*  CREATININE  2.03*  --  1.40*  CALCIUM 8.3*  --  7.3*  PHOS  --   --  2.5   CBC Recent Labs  Lab 02/02/19 1204 02/02/19 1252 02/02/19 1321 02/03/19 0428 02/03/19 1847 02/04/19 0501  WBC 4.0  --   --  4.2 3.5* 3.7*  NEUTROABS 3.0  --   --   --   --  2.9  HGB 16.6*  --  17.0* 13.9 11.3* 11.4*  HCT 48.7*  --  50.0* 39.2 31.7* 31.7*  MCV 87.6  --   --  83.6 83.2 84.3  PLT 64* 65*  --  29* 49* 52*    Medications:    . chlorhexidine  15 mL Mouth Rinse BID  . mouth rinse  15 mL Mouth Rinse q12n4p  . nicotine  14 mg Transdermal Daily  . pantoprazole (PROTONIX)  IV  40 mg Intravenous Q24H  . senna-docusate  1 tablet Oral QHS      Bufford ButtnerElizabeth Jakye Mullens, MD [facility] pgr [phone] 02/04/2019, 10:58 AM

## 2019-02-05 ENCOUNTER — Encounter (HOSPITAL_COMMUNITY)
Admission: EM | Disposition: A | Discharge status: Home / Self Care | Payer: Self-pay | Source: Home / Self Care | Attending: Internal Medicine

## 2019-02-05 ENCOUNTER — Inpatient Hospital Stay (HOSPITAL_COMMUNITY): Payer: 59

## 2019-02-05 ENCOUNTER — Inpatient Hospital Stay (HOSPITAL_COMMUNITY): Payer: 59 | Admitting: Critical Care Medicine

## 2019-02-05 ENCOUNTER — Encounter (HOSPITAL_COMMUNITY): Payer: Self-pay | Admitting: Critical Care Medicine

## 2019-02-05 DIAGNOSIS — R7989 Other specified abnormal findings of blood chemistry: Secondary | ICD-10-CM

## 2019-02-05 DIAGNOSIS — I871 Compression of vein: Secondary | ICD-10-CM

## 2019-02-05 DIAGNOSIS — R1084 Generalized abdominal pain: Secondary | ICD-10-CM | POA: Clinically undetermined

## 2019-02-05 DIAGNOSIS — R933 Abnormal findings on diagnostic imaging of other parts of digestive tract: Secondary | ICD-10-CM | POA: Clinically undetermined

## 2019-02-05 HISTORY — PX: ESOPHAGOGASTRODUODENOSCOPY (EGD) WITH PROPOFOL: SHX5813

## 2019-02-05 LAB — GASTROINTESTINAL PANEL BY PCR, STOOL (REPLACES STOOL CULTURE)

## 2019-02-05 LAB — CBC
HCT: 30.5 % — ABNORMAL LOW (ref 36.0–46.0)
Hemoglobin: 10.3 g/dL — ABNORMAL LOW (ref 12.0–15.0)
MCH: 29.6 pg (ref 26.0–34.0)
MCHC: 33.8 g/dL (ref 30.0–36.0)
MCV: 87.6 fL (ref 80.0–100.0)
Platelets: 44 10*3/uL — ABNORMAL LOW (ref 150–400)
RBC: 3.48 MIL/uL — ABNORMAL LOW (ref 3.87–5.11)
RDW: 14.3 % (ref 11.5–15.5)
WBC: 7.2 10*3/uL (ref 4.0–10.5)
nRBC: 0 % (ref 0.0–0.2)

## 2019-02-05 LAB — COMPREHENSIVE METABOLIC PANEL
ALT: 31 U/L (ref 0–44)
AST: 55 U/L — ABNORMAL HIGH (ref 15–41)
Albumin: 1.9 g/dL — ABNORMAL LOW (ref 3.5–5.0)
Alkaline Phosphatase: 63 U/L (ref 38–126)
Anion gap: 10 (ref 5–15)
BUN: 17 mg/dL (ref 6–20)
CO2: 29 mmol/L (ref 22–32)
Calcium: 7.5 mg/dL — ABNORMAL LOW (ref 8.9–10.3)
Chloride: 99 mmol/L (ref 98–111)
Creatinine, Ser: 0.68 mg/dL (ref 0.44–1.00)
GFR calc Af Amer: >60 mL/min (ref >60)
GFR calc non Af Amer: >60 mL/min (ref >60)
Glucose, Bld: 100 mg/dL — ABNORMAL HIGH (ref 70–99)
Potassium: 3.3 mmol/L — ABNORMAL LOW (ref 3.5–5.1)
Sodium: 138 mmol/L (ref 135–145)
Total Bilirubin: 0.9 mg/dL (ref 0.3–1.2)
Total Protein: 4.7 g/dL — ABNORMAL LOW (ref 6.5–8.1)

## 2019-02-05 LAB — URINALYSIS, ROUTINE W REFLEX MICROSCOPIC
Bilirubin Urine: NEGATIVE
Glucose, UA: NEGATIVE mg/dL
Ketones, ur: 20 mg/dL — AB
NITRITE: NEGATIVE
Protein, ur: 30 mg/dL — AB
RBC / HPF: >50 RBC/hpf — ABNORMAL HIGH (ref 0–5)
Specific Gravity, Urine: 1.011 (ref 1.005–1.030)
pH: 9 — ABNORMAL HIGH (ref 5.0–8.0)

## 2019-02-05 LAB — PHOSPHORUS: PHOSPHORUS: 2.6 mg/dL (ref 2.5–4.6)

## 2019-02-05 LAB — EXPECTORATED SPUTUM ASSESSMENT W GRAM STAIN, RFLX TO RESP C

## 2019-02-05 LAB — TECHNOLOGIST SMEAR REVIEW

## 2019-02-05 LAB — COMPLEMENT, TOTAL: Compl, Total (CH50): >60 U/mL (ref >41)

## 2019-02-05 LAB — MAGNESIUM: Magnesium: 2.1 mg/dL (ref 1.7–2.4)

## 2019-02-05 LAB — GLUCOSE, CAPILLARY: Glucose-Capillary: 80 mg/dL (ref 70–99)

## 2019-02-05 SURGERY — ESOPHAGOGASTRODUODENOSCOPY (EGD) WITH PROPOFOL
Anesthesia: Monitor Anesthesia Care

## 2019-02-05 MED ORDER — SODIUM CHLORIDE 0.9% IV SOLUTION: Freq: Once | INTRAVENOUS | Status: DC

## 2019-02-05 MED ORDER — SODIUM CHLORIDE 0.9 % IV SOLN
2.0000 g | Freq: Three times a day (TID) | INTRAVENOUS | Status: DC
  Administered 2019-02-05 – 2019-02-07 (×5): 2 g via INTRAVENOUS
  Filled 2019-02-05 (×7): qty 2

## 2019-02-05 MED ORDER — PANTOPRAZOLE SODIUM 40 MG IV SOLR: 40.0000 mg | Freq: Two times a day (BID) | INTRAVENOUS | Status: DC

## 2019-02-05 MED ORDER — BISACODYL 5 MG PO TBEC
5.0000 mg | DELAYED_RELEASE_TABLET | Freq: Every day | ORAL | Status: DC | PRN
  Administered 2019-02-07: 5 mg via ORAL
  Filled 2019-02-05: qty 1

## 2019-02-05 MED ORDER — PROPOFOL 500 MG/50ML IV EMUL
INTRAVENOUS | Status: DC | PRN
  Administered 2019-02-05: 200 ug/kg/min via INTRAVENOUS

## 2019-02-05 MED ORDER — IOPAMIDOL (ISOVUE-370) INJECTION 76%
INTRAVENOUS | Status: AC
  Administered 2019-02-05: 100 mL
  Filled 2019-02-05: qty 100

## 2019-02-05 MED ORDER — SODIUM CHLORIDE 0.9 % IV SOLN
80.0000 mg | Freq: Once | INTRAVENOUS | Status: AC
  Administered 2019-02-05: 80 mg via INTRAVENOUS
  Filled 2019-02-05: qty 80

## 2019-02-05 MED ORDER — ACETAMINOPHEN 10 MG/ML IV SOLN
1000.0000 mg | Freq: Four times a day (QID) | INTRAVENOUS | Status: AC | PRN
  Administered 2019-02-05: 1000 mg via INTRAVENOUS
  Filled 2019-02-05: qty 100

## 2019-02-05 MED ORDER — SODIUM CHLORIDE 0.9% IV SOLUTION
Freq: Once | INTRAVENOUS | Status: AC
  Administered 2019-02-05: 09:00:00 via INTRAVENOUS

## 2019-02-05 MED ORDER — OXYCODONE-ACETAMINOPHEN 7.5-325 MG PO TABS
1.0000 | ORAL_TABLET | ORAL | Status: DC | PRN
  Administered 2019-02-06 – 2019-02-08 (×7): 1 via ORAL
  Filled 2019-02-05 (×7): qty 1

## 2019-02-05 MED ORDER — LACTATED RINGERS IV BOLUS
1000.0000 mL | Freq: Once | INTRAVENOUS | Status: AC
  Administered 2019-02-05: 1000 mL via INTRAVENOUS

## 2019-02-05 MED ORDER — SENNOSIDES-DOCUSATE SODIUM 8.6-50 MG PO TABS: 1.0000 | ORAL_TABLET | Freq: Every evening | ORAL | Status: DC | PRN

## 2019-02-05 MED ORDER — POTASSIUM CHLORIDE 10 MEQ/100ML IV SOLN
10.0000 meq | INTRAVENOUS | Status: AC
  Administered 2019-02-05 (×2): 10 meq via INTRAVENOUS
  Filled 2019-02-05 (×3): qty 100

## 2019-02-05 MED ORDER — PROPOFOL 10 MG/ML IV BOLUS
INTRAVENOUS | Status: DC | PRN
  Administered 2019-02-05 (×2): 10 mg via INTRAVENOUS

## 2019-02-05 MED ORDER — SODIUM CHLORIDE 0.9 % IV SOLN
8.0000 mg/h | INTRAVENOUS | Status: DC
  Administered 2019-02-05 – 2019-02-08 (×6): 8 mg/h via INTRAVENOUS
  Filled 2019-02-05 (×11): qty 80

## 2019-02-05 SURGICAL SUPPLY — 15 items

## 2019-02-05 NOTE — Anesthesia Procedure Notes (Signed)
Procedure Name: MAC Date/Time: 02/05/2019 1:44 PM Performed by: Wilburn Cornelia, CRNA Pre-anesthesia Checklist: Patient identified, Emergency Drugs available, Suction available, Patient being monitored and Timeout performed Patient Re-evaluated:Patient Re-evaluated prior to induction Oxygen Delivery Method: Nasal cannula Placement Confirmation: positive ETCO2 and breath sounds checked- equal and bilateral Dental Injury: Teeth and Oropharynx as per pre-operative assessment

## 2019-02-05 NOTE — Progress Notes (Signed)
LINDZI WEISENBACH   DOB:[birth year]   SV#:[id number]   PQZ#:[id number]  Subjective:  Ms Billock tells me she had a "2-bucket BM"; brown, not black or bloody. Tongue is sore but no ST dysphagia or pain on swallowing. No cough, pleurisy or SOB. Ambulated in hall briefly yesterday. No obvious bleeding or bruising. Scheduled for EGD/[facility] later today. No family in room  Platelets not yet fully recovered Results for JANIECIA, SODDERS (MRN [id number]) as of 02/05/2019 07:57  Ref. Range 02/02/2019 12:52 02/03/2019 04:28 02/03/2019 18:47 02/04/2019 05:01 02/05/2019 02:51  Platelets Latest Ref Range: 150 - 400 K/uL 65 (L) 29 (LL) 49 (L) 52 (L) 44 (L)   Objective: middle aged White woman examined in bed Vitals:   02/05/19 0645 02/05/19 0700  BP: 123/78 126/74  Pulse: 98 91  Resp: (!) 21 18  Temp:    SpO2: 98% 98%    Body mass index is 19.44 kg/m.  Intake/Output Summary (Last 24 hours) at 02/05/2019 0755 Last data filed at 02/05/2019 0330 Gross per 24 hour  Intake 428.8 ml  Output 300 ml  Net 128.8 ml   Facial swelling resolved Sclerae unicteric, EOMs intact Oropharynx shows healing tongue lesions, no new lesions No cervical or supraclavicular adenopathy Left chest wall-- no obvious venous engorgement Lungs no rales or rhonchi, auscultated anterolaterally Heart regular rate and rhythm Abd soft, nontender, positive bowel sounds MSK continuing bilateral UE swelling, slightly decreased; no LE swelling Neuro: nonfocal, well oriented, positive affect Breasts: Deferred    CBG (last 3)  No results for input(s): GLUCAP in the last 72 hours.   Labs:  Lab Results  Component Value Date   WBC 7.2 02/05/2019   HGB 10.3 (L) 02/05/2019   HCT 30.5 (L) 02/05/2019   MCV 87.6 02/05/2019   PLT 44 (L) 02/05/2019   NEUTROABS 2.9 02/04/2019    @LASTCHEMISTRY @  Urine Studies No results for input(s): UHGB, CRYS in the last 72 hours.  Invalid input(s): UACOL, UAPR, USPG, UPH, UTP, UGL, UKET, UBIL, UNIT,  UROB, [place], UEPI, UWBC, [place], [place], [place], [place], [place]  Basic Metabolic Panel: Recent Labs  Lab 02/02/19 1204 02/02/19 1321 02/03/19 0428 02/04/19 1024 02/05/19 0251  NA 133* 131* 131* 140 138  K 3.2* 3.1* 3.5 2.4* 3.3*  CL 102  --  97* 93* 99  CO2 14*  --  20* 35* 29  GLUCOSE 137*  --  129* 116* 100*  BUN 42*  --  43* 21* 17  CREATININE 2.03*  --  1.40* 0.77 0.68  CALCIUM 8.3*  --  7.3* 7.3* 7.5*  MG  --   --  2.1  --  2.1  PHOS  --   --  2.5  --  2.6   GFR Estimated Creatinine Clearance: 64.9 mL/min (by C-G formula based on SCr of 0.68 mg/dL). Liver Function Tests: Recent Labs  Lab 02/02/19 1204 02/04/19 1024 02/05/19 0251  AST 48* 51* 55*  ALT 29 29 31   ALKPHOS 90 70 63  BILITOT 0.5 0.5 0.9  PROT 6.9 4.7* 4.7*  ALBUMIN 3.4* 1.9* 1.9*   No results for input(s): LIPASE, AMYLASE in the last 168 hours. No results for input(s): AMMONIA in the last 168 hours. Coagulation profile Recent Labs  Lab 02/02/19 1252  INR 1.12    CBC: Recent Labs  Lab 02/02/19 1204 02/02/19 1252 02/02/19 1321 02/03/19 0428 02/03/19 1847 02/04/19 0501 02/05/19 0251  WBC 4.0  --   --  4.2 3.5* 3.7* 7.2  NEUTROABS 3.0  --   --   --   --  2.9  --   HGB 16.6*  --  17.0* 13.9 11.3* 11.4* 10.3*  HCT 48.7*  --  50.0* 39.2 31.7* 31.7* 30.5*  MCV 87.6  --   --  83.6 83.2 84.3 87.6  PLT 64* 65*  --  29* 49* 52* 44*   Cardiac Enzymes: Recent Labs  Lab 02/02/19 1507 02/03/19 1517 02/03/19 2313 02/04/19 1024  CKTOTAL 503*  --   --  261*  TROPONINI  --  <0.03 <0.03  --    BNP: Invalid input(s): POCBNP CBG: No results for input(s): GLUCAP in the last 168 hours. D-Dimer Recent Labs    02/02/19 1252  DDIMER 9.59*   Hgb A1c No results for input(s): HGBA1C in the last 72 hours. Lipid Profile No results for input(s): CHOL, HDL, LDLCALC, TRIG, CHOLHDL, LDLDIRECT in the last 72 hours. Thyroid function studies No results for input(s): TSH, T4TOTAL, T3FREE, THYROIDAB in the last 72  hours.  Invalid input(s): FREET3 Anemia work up No results for input(s): VITAMINB12, FOLATE, FERRITIN, TIBC, IRON, RETICCTPCT in the last 72 hours. Microbiology Recent Results (from the past 240 hour(s))  Culture, blood (Routine X 2) w Reflex to ID Panel     Status: None (Preliminary result)   Collection Time: 02/02/19  3:07 PM  Result Value Ref Range Status   Specimen Description   Final    BLOOD RIGHT ARM Performed at [facility], [address], [place], [place] [address]    Special Requests   Final    BOTTLES DRAWN AEROBIC ONLY Blood Culture results may not be optimal due to an inadequate volume of blood received in culture bottles Performed at [facility], [address] [place], [place] [address]    Culture   Final    NO GROWTH 2 DAYS Performed at [facility], [address] [place], [place] [address]    Report Status PENDING  Incomplete  Culture, blood (Routine X 2) w Reflex to ID Panel     Status: None (Preliminary result)   Collection Time: 02/02/19  3:07 PM  Result Value Ref Range Status   Specimen Description   Final    BLOOD RIGHT ARM Performed at [facility], [address] [place], [place] [address]    Special Requests   Final    BOTTLES DRAWN AEROBIC ONLY Blood Culture results may not be optimal due to an inadequate volume of blood received in culture bottles Performed at [facility], [address] [place], [place] [address]    Culture   Final    NO GROWTH 2 DAYS Performed at [facility], [address], [place], [place] [address]    Report Status PENDING  Incomplete  MRSA PCR Screening     Status: None   Collection Time: 02/02/19  8:30 PM  Result Value Ref Range Status   MRSA by PCR NEGATIVE NEGATIVE Final    Comment:        The GeneXpert MRSA Assay (FDA approved for NASAL specimens only), is one component of a comprehensive MRSA colonization surveillance  program. It is not intended to diagnose MRSA infection nor to guide or monitor treatment for MRSA infections. Performed at [facility], [address], [place], [place] [address]   Culture, blood (Routine X 2) w Reflex to ID Panel     Status: None (Preliminary result)   Collection Time: 02/03/19  4:12 PM  Result Value Ref Range Status  Specimen Description BLOOD LEFT FOOT  Final   Special Requests   Final    BOTTLES DRAWN AEROBIC AND ANAEROBIC Blood Culture adequate volume   Culture  Setup Time   Final    GRAM NEGATIVE RODS ANAEROBIC BOTTLE ONLY Organism ID to follow CRITICAL RESULT CALLED TO, READ BACK BY AND VERIFIED WITH: K AMEND [facility] 2207 02/04/19 A BROWNING Performed at [facility], [address] [place], [place] [address]    Culture PENDING  Incomplete   Report Status PENDING  Incomplete  Blood Culture ID Panel (Reflexed)     Status: Abnormal   Collection Time: 02/03/19  4:12 PM  Result Value Ref Range Status   Enterococcus species NOT DETECTED NOT DETECTED Final   Listeria monocytogenes NOT DETECTED NOT DETECTED Final   Staphylococcus species NOT DETECTED NOT DETECTED Final   Staphylococcus aureus (BCID) NOT DETECTED NOT DETECTED Final   Streptococcus species NOT DETECTED NOT DETECTED Final   Streptococcus agalactiae NOT DETECTED NOT DETECTED Final   Streptococcus pneumoniae NOT DETECTED NOT DETECTED Final   Streptococcus pyogenes NOT DETECTED NOT DETECTED Final   Acinetobacter baumannii NOT DETECTED NOT DETECTED Final   Enterobacteriaceae species DETECTED (A) NOT DETECTED Final    Comment: Enterobacteriaceae represent a large family of gram negative bacteria, not a single organism. Refer to culture for further identification. CRITICAL RESULT CALLED TO, READ BACK BY AND VERIFIED WITH: K AMEND PHARMD 2207 02/04/19 A BROWNING    Enterobacter cloacae complex NOT DETECTED NOT DETECTED Final   Escherichia coli NOT DETECTED NOT DETECTED Final   Klebsiella  oxytoca NOT DETECTED NOT DETECTED Final   Klebsiella pneumoniae NOT DETECTED NOT DETECTED Final   Proteus species NOT DETECTED NOT DETECTED Final   Serratia marcescens NOT DETECTED NOT DETECTED Final   Carbapenem resistance NOT DETECTED NOT DETECTED Final   Haemophilus influenzae NOT DETECTED NOT DETECTED Final   Neisseria meningitidis NOT DETECTED NOT DETECTED Final   Pseudomonas aeruginosa NOT DETECTED NOT DETECTED Final   Candida albicans NOT DETECTED NOT DETECTED Final   Candida glabrata NOT DETECTED NOT DETECTED Final   Candida krusei NOT DETECTED NOT DETECTED Final   Candida parapsilosis NOT DETECTED NOT DETECTED Final   Candida tropicalis NOT DETECTED NOT DETECTED Final    Comment: Performed at [facility], [address] [place], [place] [address]  Culture, blood (Routine X 2) w Reflex to ID Panel     Status: None (Preliminary result)   Collection Time: 02/03/19  4:24 PM  Result Value Ref Range Status   Specimen Description BLOOD LEFT ANKLE  Final   Special Requests   Final    BOTTLES DRAWN AEROBIC AND ANAEROBIC Blood Culture results may not be optimal due to an excessive volume of blood received in culture bottles   Culture   Final    NO GROWTH < 24 HOURS Performed at [facility], [address] [place], [place] [address]    Report Status PENDING  Incomplete      Studies:  Dg Chest Port 1 View  Result Date: 02/05/2019 CLINICAL DATA:  Acute respiratory failure EXAM: PORTABLE CHEST 1 VIEW COMPARISON:  Chest CT 3 days ago FINDINGS: Interval hazy density in the left base. No edema, definite effusion, or pneumothorax. Normal heart size IMPRESSION: New hazy opacity at the left base that could be atelectasis or infection. Electronically Signed   By: Marnee SpringJonathon  Watts M.D.   On: 02/05/2019 06:52    Assessment: 58 y.o. [place]  woman presenting with severe upper body swelling, tongue bruising, moderate/severe thrombocytopenia and significant renal  dysfunction  Plan:  The overall trend shows clear improvement. CK, lactic acid and creatinine now normal or normalizing. C1 esterase inh is normal. ADAMTS13 is slightly decreased, which is nonspecific. In the absence of schistocytes (smear repeated today) and with now normal renal function, TTP/HUS unlikely.  She has a positive blood culture and a positive ANA. Platelets have not fully recovered and she still has unexplained UE swelling.  If EGD today is nondiagnostic I would suggest repeat chest CT with contrast or CT/angio.  Will continue to follow with you       Lowella DellGustav C Odyn Turko, MD 02/05/2019  7:55 AM Medical Oncology and Hematology [facility] [address] [place], [place] [address] Tel. [phone]    Fax. [phone]

## 2019-02-05 NOTE — Anesthesia Postprocedure Evaluation (Signed)
Anesthesia Post Note  Patient: Sarah Fry  Procedure(s) Performed: ESOPHAGOGASTRODUODENOSCOPY (EGD) WITH PROPOFOL (N/A )     Patient location during evaluation: Endoscopy Anesthesia Type: MAC Level of consciousness: awake Pain management: pain level controlled Vital Signs Assessment: post-procedure vital signs reviewed and stable Respiratory status: spontaneous breathing Cardiovascular status: stable Postop Assessment: no apparent nausea or vomiting Anesthetic complications: no    Last Vitals:  Vitals:   02/05/19 1500 02/05/19 1559  BP:    Pulse:    Resp: 18   Temp:  36.9 C  SpO2:      Last Pain:  Vitals:   02/05/19 1559  TempSrc: Oral  PainSc:                  Lamond Glantz

## 2019-02-05 NOTE — Interval H&P Note (Signed)
History and Physical Interval Note:  02/05/2019 1:42 PM  Sarah Fry  has presented today for surgery, with the diagnosis of esophageal thinkening  The various methods of treatment have been discussed with the patient and family. After consideration of risks, benefits and other options for treatment, the patient has consented to  Procedure(s): ESOPHAGOGASTRODUODENOSCOPY (EGD) WITH PROPOFOL (N/A) as a surgical intervention .  The patient's history has been reviewed, patient examined, no change in status, stable for surgery.  I have reviewed the patient's chart and labs.  Questions were answered to the patient's satisfaction.     [name]

## 2019-02-05 NOTE — Op Note (Signed)
[facility] Patient Name: Sarah Fry Procedure Date : 02/05/2019 MRN: [id number] Attending MD: [name] , MD Date of Birth: January 23, 1961 CSN: [id number] Age: 58 Admit Type: Inpatient Procedure:                Upper GI endoscopy Indications:              Generalized abdominal pain, Abnormal CT of the GI                            tract Providers:                [name], MD, Dwain Sarna, RN, Zoila Shutter, Technician Referring MD:             hospital team Medicines:                Propofol per Anesthesia, Monitored Anesthesia Care Complications:            No immediate complications. Estimated Blood Loss:     Estimated blood loss was minimal. Procedure:                Pre-Anesthesia Assessment:                           - Prior to the procedure, a History and Physical                            was performed, and patient medications and                            allergies were reviewed. The patient's tolerance of                            previous anesthesia was also reviewed. The risks                            and benefits of the procedure and the sedation                            options and risks were discussed with the patient.                            All questions were answered, and informed consent                            was obtained. Prior Anticoagulants: The patient has                            taken no previous anticoagulant or antiplatelet                            agents. ASA Grade Assessment: III - A patient with  severe systemic disease. After reviewing the risks                            and benefits, the patient was deemed in                            satisfactory condition to undergo the procedure.                           After obtaining informed consent, the endoscope was                            passed under direct vision. Throughout the            procedure, the patient's blood pressure, pulse, and                            oxygen saturations were monitored continuously. The                            GIF-H190 ([id number] Olympus gastroscope was                            introduced through the mouth, and advanced to the                            second part of duodenum. The upper GI endoscopy was                            accomplished without difficulty. The patient                            tolerated the procedure well. Scope In: Scope Out: Findings:      LA Grade D (one or more mucosal breaks involving at least 75% of       esophageal circumference) esophagitis with bleeding was found in the mid       and distal esophagus.      The Z-line was found 42 cm from the incisors.      Diffuse severe mucosal changes characterized by congestion, erythema,       inflammation and ulceration were found in the prepyloric region of the       stomach and at the pylorus.      Four non-bleeding cratered gastric ulcers with no stigmata of bleeding       were found in the prepyloric region of the stomach and at the pylorus.      Diffuse severe mucosal changes characterized by congestion, erythema,       hemorrhagic appearance, inflammation and ulceration were found in the       duodenal bulb and in the second portion of the duodenum.      The cardia and gastric fundus were normal on retroflexion. Impression:               - LA Grade D esophagitis.                           - Z-line,  42 cm from the incisors.                           - Congested, erythematous, inflamed and ulcerated                            mucosa in the prepyloric region of the stomach and                            pylorus.                           - Non-bleeding gastric ulcers with no stigmata of                            bleeding.                           - Mucosal changes in the duodenum.                           - No specimens collected.                            - Diffuse ulcers in examined duodenum; pyloric                            channel and esophagus concerning for an ischemic                            process and will need further evaluation of that                            with a CT angiogram. Will discuss with critical                            care team. Recommendation:           - Due to low platelets and diffuse extent of ulcers                            decision was made not to biopsy. Will need                            evaluation for an gastrointestinal ischemic process.                           - Give Protonix (pantoprazole): initiate therapy                            with 80 mg IV bolus, then 8 mg/hr IV by continuous                            infusion.                           -  NPO. Procedure Code(s):        --- Professional ---                           [phone] [id number], Esophagogastroduodenoscopy, flexible,                            transoral; diagnostic, including collection of                            specimen(s) by brushing or washing, when performed                            (separate procedure) Diagnosis Code(s):        --- Professional ---                           R10.84, Generalized abdominal pain                           K25.9, Gastric ulcer, unspecified as acute or                            chronic, without hemorrhage or perforation                           K20.9, Esophagitis, unspecified                           K29.70, Gastritis, unspecified, without bleeding                           K31.89, Other diseases of stomach and duodenum                           R93.3, Abnormal findings on diagnostic imaging of                            other parts of digestive tract CPT copyright 2018 American Medical Association. All rights reserved. The codes documented in this report are preliminary and upon coder review may  be revised to meet current compliance requirements. Shirley FriarVincent C Clela Hagadorn, MD 02/05/2019 2:09:54  PM This report has been signed electronically. Number of Addenda: 0

## 2019-02-05 NOTE — Progress Notes (Signed)
[facility] Progress Note    Assessment/ Plan:   1. R face/ tongue/ hand swelling: Repeat total complement WNL. C1 esterase inhibitor WNL.  Less likely TTP/HUS given resolution of AKI. No MAHA.  Repeat smear again shows no schistocytes.  Possibly that this is sepsis secondary to gastroenteritis.  Shiga toxin, EHEC still pending.  Patient on cefepime and final culture results still pending.  Management per primary.  2.  AKI and thrombocytopenia: AKI has resolved, given that patient now bacteremic kidney biopsy not indicated. No MAHA. ANA mildly elevated with 1:80, anti-scleroderma antibody negative.  Shiga toxin, EHEC still pending.  Continue to monitor renal function.  Dispo: ICU pending clinical improvement  Subjective:   Patient sleeping in bed.   Objective:   BP 129/73   Pulse 85   Temp 99.1 F (37.3 C) (Oral)   Resp (!) 21   Ht 5\' 5"  (1.651 m)   Wt 53 kg   SpO2 98%   BMI 19.44 kg/m   Intake/Output Summary (Last 24 hours) at 02/05/2019 1017 Last data filed at 02/05/2019 0800 Gross per 24 hour  Intake 652.14 ml  Output 300 ml  Net 352.14 ml   Weight change:   Physical Exam: General: NAD ENTM: Moist mucous membranes Neck: Supple, no LAD Cardiovascular: no LE edema Respiratory: normal work of breathing Derm: no rashes appreciated Neuro: CN II-XII grossly intact Psych: AOx3, appropriate affect  Imaging: Dg Chest Port 1 View  Result Date: 02/05/2019 CLINICAL DATA:  Acute respiratory failure EXAM: PORTABLE CHEST 1 VIEW COMPARISON:  Chest CT 3 days ago FINDINGS: Interval hazy density in the left base. No edema, definite effusion, or pneumothorax. Normal heart size IMPRESSION: New hazy opacity at the left base that could be atelectasis or infection. Electronically Signed   By: Marnee SpringJonathon  Watts M.D.   On: 02/05/2019 06:52   Labs: BMET Recent Labs  Lab 02/02/19 1204 02/02/19 1321 02/03/19 0428 02/04/19 1024 02/05/19 0251  NA 133* 131* 131* 140 138  K  3.2* 3.1* 3.5 2.4* 3.3*  CL 102  --  97* 93* 99  CO2 14*  --  20* 35* 29  GLUCOSE 137*  --  129* 116* 100*  BUN 42*  --  43* 21* 17  CREATININE 2.03*  --  1.40* 0.77 0.68  CALCIUM 8.3*  --  7.3* 7.3* 7.5*  PHOS  --   --  2.5  --  2.6   CBC Recent Labs  Lab 02/02/19 1204  02/03/19 0428 02/03/19 1847 02/04/19 0501 02/05/19 0251  WBC 4.0  --  4.2 3.5* 3.7* 7.2  NEUTROABS 3.0  --   --   --  2.9  --   HGB 16.6*   < > 13.9 11.3* 11.4* 10.3*  HCT 48.7*   < > 39.2 31.7* 31.7* 30.5*  MCV 87.6  --  83.6 83.2 84.3 87.6  PLT 64*   < > 29* 49* 52* 44*   < > = values in this interval not displayed.    Medications:    . sodium chloride   Intravenous Once  . chlorhexidine  15 mL Mouth Rinse BID  . mouth rinse  15 mL Mouth Rinse q12n4p  . nicotine  14 mg Transdermal Daily  . pantoprazole (PROTONIX) IV  40 mg Intravenous Q24H  . polyethylene glycol  17 g Oral BID  . potassium chloride  40 mEq Oral BID    [place] Evalin Shawhan, DO  PGY-2, [facility]   02/05/19 10:17  AM

## 2019-02-05 NOTE — Anesthesia Preprocedure Evaluation (Signed)
Anesthesia Evaluation  Patient identified by MRN, date of birth, ID band  Reviewed: Allergy & Precautions, NPO status , Patient's Chart, lab work & pertinent test results  Airway Mallampati: II  TM Distance: >3 FB     Dental   Pulmonary Current Smoker,    breath sounds clear to auscultation       Cardiovascular  Rhythm:Regular Rate:Normal     Neuro/Psych    GI/Hepatic Neg liver ROS, History noted. CG   Endo/Other  negative endocrine ROS  Renal/GU Renal disease     Musculoskeletal   Abdominal   Peds  Hematology   Anesthesia Other Findings   Reproductive/Obstetrics                             Anesthesia Physical Anesthesia Plan  ASA: III  Anesthesia Plan: MAC   Post-op Pain Management:    Induction: Intravenous  PONV Risk Score and Plan: 1 and Treatment may vary due to age or medical condition  Airway Management Planned: Nasal Cannula and Simple Face Mask  Additional Equipment:   Intra-op Plan:   Post-operative Plan:   Informed Consent: I have reviewed the patients History and Physical, chart, labs and discussed the procedure including the risks, benefits and alternatives for the proposed anesthesia with the patient or authorized representative who has indicated his/her understanding and acceptance.     Dental advisory given  Plan Discussed with: CRNA and Anesthesiologist  Anesthesia Plan Comments:         Anesthesia Quick Evaluation

## 2019-02-05 NOTE — Progress Notes (Signed)
Physical Therapy Treatment Patient Details Name: Sarah Fry MRN: [id number] DOB: [birth year] Today's Date: 02/05/2019    History of Present Illness Pt is a 58 y.o. female admitted 02/02/19 with right eye, tongue and hand swelling; unclear etiology. Worked up for AKI, mild rhabdomyolysis, thrombocytopenia. CT noted esophageal thickening. PMH includes kidney stones.    PT Comments    Patient doing well with therapy today, ambulating unit on RA, no DOE or LOB. Desat to 85% returns within 1-2 minutes to 94%. No Assistive device, min guard at this time.    Follow Up Recommendations  No PT follow up;Supervision for mobility/OOB     Equipment Recommendations       Recommendations for Other Services       Precautions / Restrictions Precautions Precautions: Fall Precaution Comments: watch O2 Restrictions Weight Bearing Restrictions: No    Mobility  Bed Mobility Overal bed mobility: Modified Independent             General bed mobility comments: Increased effort  Transfers Overall transfer level: Needs assistance Equipment used: None Transfers: Sit to/from Stand Sit to Stand: Supervision            Ambulation/Gait Ambulation/Gait assistance: Min guard Gait Distance (Feet): 200 Feet Assistive device: None Gait Pattern/deviations: Step-through pattern;Decreased stride length Gait velocity: decreased   General Gait Details: ambulating more fluid from last PT session, no DOE or LOB, min guard for mild unsteadiness. HR 120, SpO2 85% on RA, returns quickly over 90%   Stairs             Wheelchair Mobility    Modified Rankin (Stroke Patients Only)       Balance Overall balance assessment: Needs assistance   Sitting balance-Leahy Scale: Good       Standing balance-Leahy Scale: Fair                              Cognition Arousal/Alertness: Awake/alert Behavior During Therapy: WFL for tasks assessed/performed Overall Cognitive  Status: Within Functional Limits for tasks assessed                                        Exercises      General Comments        Pertinent Vitals/Pain Pain Assessment: Faces Faces Pain Scale: Hurts a little bit Pain Location: Tongue Pain Descriptors / Indicators: Grimacing;Discomfort Pain Intervention(s): Limited activity within patient's tolerance;Monitored during session    Home Living                      Prior Function            PT Goals (current goals can now be found in the care plan section) Acute Rehab PT Goals Patient Stated Goal: Figure out what is wrong PT Goal Formulation: With patient Time For Goal Achievement: 02/18/19 Potential to Achieve Goals: Good Progress towards PT goals: Progressing toward goals    Frequency    Min 3X/week      PT Plan Current plan remains appropriate    Co-evaluation              AM-PAC PT "6 Clicks" Mobility   Outcome Measure  Help needed turning from your back to your side while in a flat bed without using bedrails?: None Help needed moving from lying on your back  to sitting on the side of a flat bed without using bedrails?: None Help needed moving to and from a bed to a chair (including a wheelchair)?: A Little Help needed standing up from a chair using your arms (e.g., wheelchair or bedside chair)?: A Little Help needed to walk in hospital room?: A Little Help needed climbing 3-5 steps with a railing? : A Little 6 Click Score: 20    End of Session Equipment Utilized During Treatment: Gait belt Activity Tolerance: Patient tolerated treatment well   Nurse Communication: Mobility status PT Visit Diagnosis: Other abnormalities of gait and mobility (R26.89)     Time: 1530-1550 PT Time Calculation (min) (ACUTE ONLY): 20 min  Charges:  $Gait Training: 8-22 mins                     Etta Grandchild, PT, DPT Acute Rehabilitation Services Pager: [phone] Office:  [phone]     Etta Grandchild 02/05/2019, 4:08 PM

## 2019-02-05 NOTE — Progress Notes (Signed)
NAME:  Sarah Fry, MRN:  [id number], DOB:  26-Feb-1961, LOS: 3 ADMISSION DATE:  02/02/2019, CONSULTATION DATE:  02/02/19 REFERRING MD:  Dr. Criss Alvine, CHIEF COMPLAINT:  Eye, tongue swelling.     Brief History   58 y/o F who presented to [facility] on 2/3 with reports of right eye, tongue and right hand swelling.     History of present illness   58 y/o F who presented to [facility] on 2/3 with reports of waking at 0300 with right eye, tongue and right hand swelling.    At baseline she is a tobacco user (began at age 20, smokes 1 pack/day).  Additionally she also uses occasional marijuana.  No IV drug abuse or narcotics.  Patient recently traveled to [place] with her son.  They were there for 5 days and did extensive walking.  She states that her legs were fatigued and achy during the trip but thought that it was related to the extent of how much they had walked.  She denies new exposures or medications.  Reports that they were very careful with hand hygiene while in [place].    States she has had some diarrhea but no other infectious symptoms to include fever, chills, nausea vomiting.  Reports mild shortness of breath, right-sided facial swelling, upper extremity swelling.  Has an allergy to flexeril and "pain medications" but has not taken them. Used an EPI pen at 1000 am with no relief of symptoms.    She presented to [facility] ER with right sided facial swelling, right hand swelling and tongue swelling.  Hemodynamically stable / actually hypertensive & tachycardic.  Initial labs notable for Na 133, K 3.2, CO2 14, glucose 137, BUN 42, sr cr 2.03, AG 17, albumin 3.4, lactic acid 4.1, WBC 4, Hgb 16.6, and platelets 64.    PCCM consulted for evaluation.   Past Medical History  Kidney Stones Tobacco Abuse   Significant Hospital Events   2/03  Admit with R eye, tongue & R hand swelling   Consults:  PCCM  [facility] Nephrology   Procedures:    Significant Diagnostic Tests:  CT  Chest / ABD / Pelvis 2/3 >> no cardiomegaly, mild aortic calcifications, small 22mm lung nodule RLL, C  T and L spine degenerative changes, no gallstones, mild gallbladder dilation, small L renal calculus, no hydronephrosis, wall thickening of esophageal ampulla  UE Venous Duplex 2/3 >> negative  MRV non-con 2/3>> no flow related signal in L transverse and sigmoid venous sinuses. Read as likely anatomical variant   CXR 2/6> new LLL opacity, atelectasis vs PNA   Micro Data:  BCx2 2/3 >>  BCx 2/4 >>GNR UA 2/3 >>   Antimicrobials:   Cefepime 2/5>>  Interim history/subjective:  Started on cefepime last night for GNR in BCx   Objective   Blood pressure 126/74, pulse 91, temperature 99.1 F (37.3 C), temperature source Oral, resp. rate 18, height 5\' 5"  (1.651 m), weight 53 kg, SpO2 98 %.        Intake/Output Summary (Last 24 hours) at 02/05/2019 0759 Last data filed at 02/05/2019 0330 Gross per 24 hour  Intake 428.8 ml  Output 300 ml  Net 128.8 ml   Filed Weights   02/03/19 0436 02/03/19 1235  Weight: 53 kg 53 kg    Examination: Gen:      WDWN adult female, supine in bed, NAD  HEENT:  NCAT, trace eyelid swelling bilaterally, moist mucus membranes, no lymphadenopathy, trachea midline.  Lungs:    Hoarse voice. CTA bilaterally, no stridor, no wheezing.  CV:         RRR no r/g/m, 2+ radial pulses, 2+ pedal pulses Abd:      Soft, round, mild distension, hyperactive x4 Ext:    Symmetrical bulk and tone, no peripheral edema  Skin:      Clean, dry, warm, intact Neuro: AAOx4, following commands  Psych: euthymic mood, congruent affect   Resolved Hospital Problem list    acute kidney injury   Assessment & Plan:  58 year old admitted with thrombocytopenia, acute renal failure and face, hand swelling of unclear etiology  Thrombocytopenia, facial swelling, AKI  -Etiology of findings remains unclear at this time. Possible rhabdo (mild) due to physical activity [place] with NSAID use -Picture is not consistent w/ DIC, TTP/HUS; no schistocytes of hemolysis -Adamts 13 low -Hematology following  Continue supportive care 1 Plt ordered today for plt 44, with upcoming EGD today  Follow up CBC this afternoon Follow up stool sample, sent for Shiga toxin Follow up serum Shiga toxin (send out lab)    Esophageal thickening Seen on CT GI following Scheduled for EGD 2/6, NPO for procedure  Acute kidney injury, mild rhabdomyolysis -AKI resolved 2/6, CK down trending Nephrology following S/p bicarb gtt per nephrology Continue to trend UOP, creatinine, CK  Left lower lung opacity -atelectasis vs PNA noted on CXR 2/6 IS q2 hour Send expectorated sputum Of note, cefepime started empirically 2/5 for bacteremia as outlined below  Bacteremia  -GNR growing on BCx Awaiting final result Cefepime started 2/5 for GNR coverage   Deconditioning, at risk -PT/OT eval   Tobacco use disorder 1ppd smoker Continue nicotine patch 14mg  qD   Best practice:  Diet: NPO for EGD today  Pain/Anxiety/Delirium protocol (if indicated): APAP, oxycodone  VAP protocol (if indicated): n/a, aspiration precautions  DVT prophylaxis: SCD's  GI prophylaxis: protonix Glucose control: n/a  Mobility: PT/OT Code Status: Full Code  Family Communication: None at bedside at time of NP rounds Disposition: Continue ICU level of care   Labs   CBC: Recent Labs  Lab 02/02/19 1204 02/02/19 1252 02/02/19 1321 02/03/19 0428 02/03/19 1847 02/04/19 0501 02/05/19 0251  WBC 4.0  --   --  4.2 3.5* 3.7* 7.2  NEUTROABS 3.0  --   --   --   --  2.9  --   HGB 16.6*  --  17.0* 13.9 11.3* 11.4* 10.3*  HCT 48.7*  --  50.0* 39.2 31.7* 31.7* 30.5*  MCV 87.6  --   --  83.6 83.2 84.3 87.6  PLT 64* 65*  --  29* 49* 52* 44*    Basic Metabolic Panel: Recent Labs  Lab 02/02/19 1204 02/02/19 1321 02/03/19 0428 02/04/19 1024 02/05/19 0251  NA 133* 131* 131* 140 138  K  3.2* 3.1* 3.5 2.4* 3.3*  CL 102  --  97* 93* 99  CO2 14*  --  20* 35* 29  GLUCOSE 137*  --  129* 116* 100*  BUN 42*  --  43* 21* 17  CREATININE 2.03*  --  1.40* 0.77 0.68  CALCIUM 8.3*  --  7.3* 7.3* 7.5*  MG  --   --  2.1  --  2.1  PHOS  --   --  2.5  --  2.6   GFR: Estimated Creatinine Clearance: 64.9 mL/min (by C-G formula based on SCr of 0.68 mg/dL). Recent Labs  Lab 02/02/19 1507 02/03/19  [id number] 02/03/19 1517 02/03/19 1847 02/03/19 2313 02/04/19 0501 02/05/19 0251  WBC  --  4.2  --  3.5*  --  3.7* 7.2  LATICACIDVEN 4.4*  --  2.3* 3.4* 1.2  --   --     Liver Function Tests: Recent Labs  Lab 02/02/19 1204 02/04/19 1024 02/05/19 0251  AST 48* 51* 55*  ALT 29 29 31   ALKPHOS 90 70 63  BILITOT 0.5 0.5 0.9  PROT 6.9 4.7* 4.7*  ALBUMIN 3.4* 1.9* 1.9*   No results for input(s): LIPASE, AMYLASE in the last 168 hours. No results for input(s): AMMONIA in the last 168 hours.  ABG    Component Value Date/Time   HCO3 14.2 (L) 02/02/2019 1321   TCO2 15 (L) 02/02/2019 1321   ACIDBASEDEF 11.0 (H) 02/02/2019 1321   O2SAT 78.0 02/02/2019 1321     Coagulation Profile: Recent Labs  Lab 02/02/19 1252  INR 1.12    Cardiac Enzymes: Recent Labs  Lab 02/02/19 1507 02/03/19 1517 02/03/19 2313 02/04/19 1024  CKTOTAL 503*  --   --  261*  TROPONINI  --  <0.03 <0.03  --     HbA1C: No results found for: HGBA1C  CBG: No results for input(s): GLUCAP in the last 168 hours.  Critical Care Time: 35 minutes    Tessie FassGrace Angeleena Dueitt MSN, AGACNP-BC [facility] 02/05/2019, 8:00 AM

## 2019-02-05 NOTE — Progress Notes (Signed)
OT Cancellation Note  Patient Details Name: Sarah Fry MRN: [id number] DOB: [birth year]   Cancelled Treatment:    Reason Eval/Treat Not Completed: Patient at procedure or test/ unavailable(EGD)  Evern BioMayberry, Udell Mazzocco Lynn 02/05/2019, 1:09 PM  Martie RoundJulie Rida Loudin, OTR/L Acute Rehabilitation Services Pager: [phone] Office: [phone]

## 2019-02-05 NOTE — Progress Notes (Signed)
[facility] ADULT ICU REPLACEMENT PROTOCOL FOR AM LAB REPLACEMENT ONLY  The patient does not apply for the [facility] Adult ICU Electrolyte Replacment Protocol based on the criteria listed below:    Is urine output >/= 0.5 ml/kg/hr for the last 6 hours? No. Patient's UOP is  unknown ml/kg/hr  Abnormal electrolyte(s): K3.3   If a panic level lab has been reported, has the CCM MD in charge been notified? Yes.  .   Physician:  Warrick Parisian, MD  Melrose Nakayama 02/05/2019 5:03 AM

## 2019-02-05 NOTE — Progress Notes (Signed)
eLink Physician-Brief Progress Note Patient Name: Sarah Fry DOB: [birth year] MRN: [id number]   Date of Service  02/05/2019  HPI/Events of Note  Pt has sevre abdominal pains related to esophageal ulcers. She is NPO. She is on PPI for peptic esophageal ulcers  eICU Interventions  Ofirmev 1 gm iv Q 6 hours prn        Jennamarie Goings U Macguire Holsinger 02/05/2019, 9:13 PM

## 2019-02-05 NOTE — Progress Notes (Signed)
Pharmacy Antibiotic Note  Sarah Fry is a 58 y.o. female admitted on 02/02/2019 with R eye, tongue, and hand swelling, 1/4 BCx now growing GNR and BCID Enterobacteriaceae species. Pharmacy has been consulted for cefepime dosing. WBC WNL, currently AF (tmax 103 on 2/4). Scr improved to 0.68, estimated CrCl ~65 mL/min.  Plan: Increase to cefepime 2g IV q8h F/u clinical status, C&S, renal function, de-escalation, LOT  Height: 5\' 5"  (165.1 cm) Weight: 116 lb 13.5 oz (53 kg) IBW/kg (Calculated) : 57  Temp (24hrs), Avg:99.2 F (37.3 C), Min:98.1 F (36.7 C), Max:100.1 F (37.8 C)  Recent Labs  Lab 02/02/19 1204 02/02/19 1310 02/02/19 1507 02/03/19 0428 02/03/19 1517 02/03/19 1847 02/03/19 2313 02/04/19 0501 02/04/19 1024 02/05/19 0251  WBC 4.0  --   --  4.2  --  3.5*  --  3.7*  --  7.2  CREATININE 2.03*  --   --  1.40*  --   --   --   --  0.77 0.68  LATICACIDVEN  --  4.1* 4.4*  --  2.3* 3.4* 1.2  --   --   --     Estimated Creatinine Clearance: 64.9 mL/min (by C-G formula based on SCr of 0.68 mg/dL).    Allergies  Allergen Reactions  . Flexeril [Cyclobenzaprine] Anaphylaxis   Antimicrobials this admission: 2/5 Cefepime >>   Microbiology results: 2/5 GI PCR: pending 2/4 BCx: 1/4 GNR (BCID Enterobacteriaceae species) 2/3 BCx: NG x2 2/3 MRSA PCR: negative  Arora Coakley N. Zigmund Daniel, PharmD, BCPS PGY2 Infectious Diseases Pharmacy Resident Phone: [phone] 02/05/2019  8:19 AM

## 2019-02-05 NOTE — Brief Op Note (Signed)
Severely ulcerated esophagus, pyloric channel, duodenal bulb and second portion of duodenum concerning for an ischemic process. See EGD report for complete findings and recommendations. Needs a CT angiogram for further evaluation. Strict NPO. Changed to Protonix drip. D/W Dr. Isaiah Serge and patient and her son.

## 2019-02-05 NOTE — Transfer of Care (Signed)
Immediate Anesthesia Transfer of Care Note  Patient: ARIAM MOL  Procedure(s) Performed: ESOPHAGOGASTRODUODENOSCOPY (EGD) WITH PROPOFOL (N/A )  Patient Location: Endoscopy Unit  Anesthesia Type:MAC  Level of Consciousness: awake, alert  and oriented  Airway & Oxygen Therapy: Patient Spontanous Breathing and Patient connected to nasal cannula oxygen  Post-op Assessment: Report given to RN and Post -op Vital signs reviewed and stable  Post vital signs: Reviewed and stable  Last Vitals:  Vitals Value Taken Time  BP    Temp    Pulse    Resp    SpO2      Last Pain:  Vitals:   02/05/19 1308  TempSrc: Oral  PainSc: 3       Patients Stated Pain Goal: 2 (09/81/19 1478)  Complications: No apparent anesthesia complications

## 2019-02-05 NOTE — Progress Notes (Signed)
eLink Physician-Brief Progress Note Patient Name: Sarah Fry DOB: [birth year] MRN: [id number]   Date of Service  02/05/2019  HPI/Events of Note  Platelet count 44 K, K+ 3.3  eICU Interventions  Elink electrolyte replacement protocol, ? Hematology evaluation for thrombocytopenia, peripheral smear to exclude microangiopathy with platelet consumption        [name] U [name] 02/05/2019, 5:25 AM

## 2019-02-06 DIAGNOSIS — T783XXA Angioneurotic edema, initial encounter: Secondary | ICD-10-CM

## 2019-02-06 LAB — BLOOD CULTURE ID PANEL (REFLEXED)
Acinetobacter baumannii: NOT DETECTED
CANDIDA TROPICALIS: NOT DETECTED
Candida albicans: NOT DETECTED
Candida glabrata: NOT DETECTED
Candida krusei: NOT DETECTED
Candida parapsilosis: NOT DETECTED
Carbapenem resistance: NOT DETECTED
Enterobacter cloacae complex: NOT DETECTED
Enterobacteriaceae species: DETECTED — AB
Enterococcus species: NOT DETECTED
Escherichia coli: NOT DETECTED
HAEMOPHILUS INFLUENZAE: NOT DETECTED
Klebsiella oxytoca: NOT DETECTED
Klebsiella pneumoniae: NOT DETECTED
Listeria monocytogenes: NOT DETECTED
NEISSERIA MENINGITIDIS: NOT DETECTED
PROTEUS SPECIES: NOT DETECTED
Pseudomonas aeruginosa: NOT DETECTED
STREPTOCOCCUS AGALACTIAE: NOT DETECTED
Serratia marcescens: NOT DETECTED
Staphylococcus aureus (BCID): NOT DETECTED
Staphylococcus species: NOT DETECTED
Streptococcus pneumoniae: NOT DETECTED
Streptococcus pyogenes: NOT DETECTED
Streptococcus species: NOT DETECTED

## 2019-02-06 LAB — CULTURE, BLOOD (ROUTINE X 2): Special Requests: ADEQUATE

## 2019-02-06 LAB — COMPREHENSIVE METABOLIC PANEL
ALT: 24 U/L (ref 0–44)
AST: 36 U/L (ref 15–41)
Albumin: 1.9 g/dL — ABNORMAL LOW (ref 3.5–5.0)
Alkaline Phosphatase: 53 U/L (ref 38–126)
Anion gap: 13 (ref 5–15)
BUN: 20 mg/dL (ref 6–20)
CHLORIDE: 108 mmol/L (ref 98–111)
CO2: 23 mmol/L (ref 22–32)
CREATININE: 0.85 mg/dL (ref 0.44–1.00)
Calcium: 7.6 mg/dL — ABNORMAL LOW (ref 8.9–10.3)
GFR calc Af Amer: >60 mL/min (ref >60)
GFR calc non Af Amer: >60 mL/min (ref >60)
Glucose, Bld: 80 mg/dL (ref 70–99)
Potassium: 3 mmol/L — ABNORMAL LOW (ref 3.5–5.1)
Sodium: 144 mmol/L (ref 135–145)
Total Bilirubin: 1.4 mg/dL — ABNORMAL HIGH (ref 0.3–1.2)
Total Protein: 4.8 g/dL — ABNORMAL LOW (ref 6.5–8.1)

## 2019-02-06 LAB — PREPARE PLATELET PHERESIS: Unit division: 0

## 2019-02-06 LAB — CK: CK TOTAL: 168 U/L (ref 38–234)

## 2019-02-06 LAB — CBC WITH DIFFERENTIAL/PLATELET
Abs Immature Granulocytes: 0.06 10*3/uL (ref 0.00–0.07)
BASOS ABS: 0 10*3/uL (ref 0.0–0.1)
Basophils Relative: 0 %
Eosinophils Absolute: 0 10*3/uL (ref 0.0–0.5)
Eosinophils Relative: 0 %
HEMATOCRIT: 27.9 % — AB (ref 36.0–46.0)
Hemoglobin: 9.1 g/dL — ABNORMAL LOW (ref 12.0–15.0)
Immature Granulocytes: 1 %
Lymphocytes Relative: 9 %
Lymphs Abs: 0.7 10*3/uL (ref 0.7–4.0)
MCH: 29.4 pg (ref 26.0–34.0)
MCHC: 32.6 g/dL (ref 30.0–36.0)
MCV: 90 fL (ref 80.0–100.0)
Monocytes Absolute: 0.4 10*3/uL (ref 0.1–1.0)
Monocytes Relative: 5 %
NEUTROS PCT: 85 %
Neutro Abs: 6.6 10*3/uL (ref 1.7–7.7)
Platelets: 86 10*3/uL — ABNORMAL LOW (ref 150–400)
RBC: 3.1 MIL/uL — ABNORMAL LOW (ref 3.87–5.11)
RDW: 14.6 % (ref 11.5–15.5)
WBC: 7.7 10*3/uL (ref 4.0–10.5)
nRBC: 0 % (ref 0.0–0.2)

## 2019-02-06 LAB — BPAM PLATELET PHERESIS
Blood Product Expiration Date: 202002082359
ISSUE DATE / TIME: 202002061030
Unit Type and Rh: 6200

## 2019-02-06 LAB — MISC LABCORP TEST (SEND OUT): Labcorp test code: 180935

## 2019-02-06 LAB — LACTATE DEHYDROGENASE: LDH: 313 U/L — ABNORMAL HIGH (ref 98–192)

## 2019-02-06 LAB — SAVE SMEAR(SSMR), FOR PROVIDER SLIDE REVIEW

## 2019-02-06 LAB — PATHOLOGIST SMEAR REVIEW

## 2019-02-06 MED ORDER — POTASSIUM CHLORIDE 20 MEQ PO PACK
40.0000 meq | PACK | Freq: Once | ORAL | Status: AC
  Administered 2019-02-06: 40 meq via ORAL

## 2019-02-06 MED ORDER — SODIUM CHLORIDE 0.9 % IV SOLN
INTRAVENOUS | Status: DC | PRN
  Administered 2019-02-06 – 2019-02-07 (×2): 250 mL via INTRAVENOUS

## 2019-02-06 NOTE — Progress Notes (Signed)
OT Cancellation Note  Patient Details Name: Sarah Fry MRN: [id number] DOB: 12/22/1961   Cancelled Treatment:    Reason Eval/Treat Not Completed: Fatigue/lethargy limiting ability to participate.  Pt adamantly refused citing fatigue, despite max encouragement.  Will reattempt.  Jeani Hawking, OTR/L Acute Rehabilitation Services Pager [phone] Office [phone]   Jeani Hawking M 02/06/2019, 4:24 PM

## 2019-02-06 NOTE — Progress Notes (Signed)
Lab called with the blood culture that was done at [facility] that is positive with citrobacter koseri. Pt is on cefepime. Will FYI CCM team.  Ulyses Southward, PharmD, BCIDP, AAHIVP, CPP Infectious Disease Pharmacist 02/06/2019 8:48 AM

## 2019-02-06 NOTE — Progress Notes (Signed)
Creatinine back to baseline.  No acute renal needs.  Will sign off.  Please call with questions.  Thank you.

## 2019-02-06 NOTE — Progress Notes (Signed)
Patient back on nasal canula since her SpO2 was in the low 80's. RN re-educated patient and she became compliant with the nasal canula. Her current SpO2 is 97% on 2L.

## 2019-02-06 NOTE — Progress Notes (Signed)
Patient complaining about nasal canula smelling "funny" and refusing to keep it on, and insisting that she can breath fine without it. Patient was educated. SpO2 is currently 91% on room air. Will continue to monitor

## 2019-02-06 NOTE — Progress Notes (Signed)
eLink Physician-Brief Progress Note Patient Name: Sarah Fry DOB: Mar 18, 1961 MRN: [id number]   Date of Service  02/06/2019  HPI/Events of Note  Pain, wants something more effective than Tylenol  eICU Interventions  Percocet 5 mg tab po q4 h prn pain        Migdalia Dk 02/06/2019, 3:36 AM

## 2019-02-06 NOTE — Progress Notes (Signed)
[facility] Progress Note  Sarah Fry 58 y.o. Jan 10, 1961   Subjective: Complaining of abdominal pain but states it is less than yesterday. Denies N/V. Wants to drink or eat something.  Objective: Vital signs: Vitals:   02/06/19 0800 02/06/19 0807  BP: 134/72   Pulse: 74   Resp: 19   Temp:  98 F (36.7 C)  SpO2: 92%     Physical Exam: Gen: alert, no acute distress  HEENT: anicteric sclera CV: RRR Chest: CTA B Abd: less tenderness (diffuse) with guarding, soft, nondistended, +BS Ext: no edema  Lab Results: Recent Labs    02/04/19 1024 02/05/19 0251  NA 140 138  K 2.4* 3.3*  CL 93* 99  CO2 35* 29  GLUCOSE 116* 100*  BUN 21* 17  CREATININE 0.77 0.68  CALCIUM 7.3* 7.5*  MG  --  2.1  PHOS  --  2.6   Recent Labs    02/04/19 1024 02/05/19 0251  AST 51* 55*  ALT 29 31  ALKPHOS 70 63  BILITOT 0.5 0.9  PROT 4.7* 4.7*  ALBUMIN 1.9* 1.9*   Recent Labs    02/04/19 0501 02/05/19 0251  WBC 3.7* 7.2  NEUTROABS 2.9  --   HGB 11.4* 10.3*  HCT 31.7* 30.5*  MCV 84.3 87.6  PLT 52* 44*      Assessment/Plan: Severe upper GI tract ulcers concerning for an ischemic process. Abd/pelvis CT angiogram negative for an arterial or venous thrombosis. Gram negative bacteremia and on Cefepime. Continue Protonix drip. Clear liquid diet ok but would not advance beyond that today. Dr. Marca Ancona will f/u tomorrow.   Sarah Fry 02/06/2019, 8:47 AM  Questions please call [phone] ID: Sarah Fry, female   DOB: 07-Feb-1961, 58 y.o.   MRN: [id number]

## 2019-02-06 NOTE — Progress Notes (Signed)
PROGRESS NOTE    Sarah Fry  ZOX:[id number] DOB: [birth year] DOA: 02/02/2019 PCP: Merlene Laughter, MD  Outpatient Specialists:    Brief Narrative: Patient is a 58 year old female with past medical history significant for nephrolithiasis.  Patient was admitted with what appears to be generalized edema with associated tongue swollen after a week trip in [place].  Apparently, patient was on acetaminophen and NSAID prior to onset of symptoms.  On presentation, patient was in acute kidney injury.  Proteinuria was noted but nothing the nephrotic range.  Blood culture has grown Citrobacter organism.  Patient has had prior angioedema.  Patient tried EpiPen prior to presentation without any significant improvement.  Significant thrombocytopenia was noted on presentation.  Thrombocytopenia is resolving.  Acute kidney injury has resolved.  Edema is resolving.  Patient has also ulcer of the tongue that is painful.  CT scan revealed thickening of the esophagus, with EGD revealing multiple ulcers, initially thought to be ischemic.  Ischemic work-up was nonrevealing to date.  Patient has been transferred to the hospitalist team for further assessment and management.   Assessment & Plan:   Active Problems:   AKI (acute kidney injury) (HCC)   Abdominal pain, generalized   Abnormal CT scan, esophagus   SVC syndrome   Acute kidney injury with severe thrombocytopenia: Acute kidney injury has resolved. Thrombocytopenia is resolving. Exact etiology is unknown. Nephrology input is highly appreciated. Hematology input is appreciated. Patient has been worked up extensively.  Bacteremia/sepsis secondary to Citrobacter: Continue current antibiotics.  Generalized edema/atypical angioedema:  Etiology is unclear. Work-up is still in process.  Tongue ulcers: Dental/oral surgery is managing. We deferred to dental/oral surgery team. Discussed lidocaine.  Severely ulcerated also is involving the  esophagus, pyloric channel in the duodenum: GI team is managing. Patient is still on Protonix.  Further management depend on hospital course.  DVT prophylaxis: SCD Code Status: Full Family Communication: Son Disposition Plan: Home eventually   Consultants:   GI  Nephrology  Oral surgery  Hematology oncology  Procedures:   EGD  Antimicrobials:   IV cefepime   Subjective: No new complaints Edema is resolving Still feels that the tongue is mildly swollen.  Objective: Vitals:   02/06/19 1100 02/06/19 1300 02/06/19 1400 02/06/19 1500  BP: 111/76 (!) 121/101 120/66 123/65  Pulse: 85 75 87 81  Resp: 19 20 14  (!) 23  Temp:    98.8 F (37.1 C)  TempSrc:    Oral  SpO2: 95% 97% 98% 95%  Weight:      Height:        Intake/Output Summary (Last 24 hours) at 02/06/2019 1522 Last data filed at 02/06/2019 1300 Gross per 24 hour  Intake 1033.42 ml  Output 2140 ml  Net -1106.58 ml   Filed Weights   02/03/19 0436 02/03/19 1235 02/06/19 0500  Weight: 53 kg 53 kg 54.1 kg    Examination:  General exam: Appears calm and comfortable.  Tongue ulcers noted. Respiratory system: Clear to auscultation. Respiratory effort normal. Cardiovascular system: S1 & S2 heard, RRR. No JVD, murmurs, rubs, gallops or clicks. No pedal edema. Gastrointestinal system: Abdomen is nondistended, soft and nontender. No organomegaly or masses felt. Normal bowel sounds heard. Central nervous system: Alert and oriented. No focal neurological deficits. Extremities: Symmetric 5 x 5 power.  Data Reviewed: I have personally reviewed following labs and imaging studies  CBC: Recent Labs  Lab 02/02/19 1204  02/03/19 0428 02/03/19 1847 02/04/19 0501 02/05/19 0251 02/06/19 0650  WBC 4.0  --  4.2 3.5* 3.7* 7.2 7.7  NEUTROABS 3.0  --   --   --  2.9  --  6.6  HGB 16.6*   < > 13.9 11.3* 11.4* 10.3* 9.1*  HCT 48.7*   < > 39.2 31.7* 31.7* 30.5* 27.9*  MCV 87.6  --  83.6 83.2 84.3 87.6 90.0  PLT 64*    < > 29* 49* 52* 44* 86*   < > = values in this interval not displayed.   Basic Metabolic Panel: Recent Labs  Lab 02/02/19 1204 02/02/19 1321 02/03/19 0428 02/04/19 1024 02/05/19 0251 02/06/19 0650  NA 133* 131* 131* 140 138 144  K 3.2* 3.1* 3.5 2.4* 3.3* 3.0*  CL 102  --  97* 93* 99 108  CO2 14*  --  20* 35* 29 23  GLUCOSE 137*  --  129* 116* 100* 80  BUN 42*  --  43* 21* 17 20  CREATININE 2.03*  --  1.40* 0.77 0.68 0.85  CALCIUM 8.3*  --  7.3* 7.3* 7.5* 7.6*  MG  --   --  2.1  --  2.1  --   PHOS  --   --  2.5  --  2.6  --    GFR: Estimated Creatinine Clearance: 62.4 mL/min (by C-G formula based on SCr of 0.85 mg/dL). Liver Function Tests: Recent Labs  Lab 02/02/19 1204 02/04/19 1024 02/05/19 0251 02/06/19 0650  AST 48* 51* 55* 36  ALT 29 29 31 24   ALKPHOS 90 70 63 53  BILITOT 0.5 0.5 0.9 1.4*  PROT 6.9 4.7* 4.7* 4.8*  ALBUMIN 3.4* 1.9* 1.9* 1.9*   No results for input(s): LIPASE, AMYLASE in the last 168 hours. No results for input(s): AMMONIA in the last 168 hours. Coagulation Profile: Recent Labs  Lab 02/02/19 1252  INR 1.12   Cardiac Enzymes: Recent Labs  Lab 02/02/19 1507 02/03/19 1517 02/03/19 2313 02/04/19 1024 02/06/19 0650  CKTOTAL 503*  --   --  261* 168  TROPONINI  --  <0.03 <0.03  --   --    BNP (last 3 results) No results for input(s): PROBNP in the last 8760 hours. HbA1C: No results for input(s): HGBA1C in the last 72 hours. CBG: Recent Labs  Lab 02/05/19 0738  GLUCAP 80   Lipid Profile: No results for input(s): CHOL, HDL, LDLCALC, TRIG, CHOLHDL, LDLDIRECT in the last 72 hours. Thyroid Function Tests: No results for input(s): TSH, T4TOTAL, FREET4, T3FREE, THYROIDAB in the last 72 hours. Anemia Panel: No results for input(s): VITAMINB12, FOLATE, FERRITIN, TIBC, IRON, RETICCTPCT in the last 72 hours. Urine analysis:    Component Value Date/Time   COLORURINE YELLOW 02/05/2019 1715   APPEARANCEUR CLEAR 02/05/2019 1715   LABSPEC  1.011 02/05/2019 1715   PHURINE 9.0 (H) 02/05/2019 1715   GLUCOSEU NEGATIVE 02/05/2019 1715   HGBUR LARGE (A) 02/05/2019 1715   BILIRUBINUR NEGATIVE 02/05/2019 1715   KETONESUR 20 (A) 02/05/2019 1715   PROTEINUR 30 (A) 02/05/2019 1715   NITRITE NEGATIVE 02/05/2019 1715   LEUKOCYTESUR LARGE (A) 02/05/2019 1715   Sepsis Labs: @LABRCNTIP (procalcitonin:4,lacticidven:4)  ) Recent Results (from the past 240 hour(s))  Culture, blood (Routine X 2) w Reflex to ID Panel     Status: None (Preliminary result)   Collection Time: 02/02/19  3:07 PM  Result Value Ref Range Status   Specimen Description   Final    BLOOD RIGHT ARM Performed at [facility], [address] [place], [place] [address]  Special Requests   Final    BOTTLES DRAWN AEROBIC ONLY Blood Culture results may not be optimal due to an inadequate volume of blood received in culture bottles Performed at [facility], [address], [place], [place] [address]    Culture  Setup Time   Final    GRAM NEGATIVE RODS AEROBIC BOTTLE ONLY Organism ID to follow CRITICAL RESULT CALLED TO, READ BACK BY AND VERIFIED WITH: Eduardo Osier [facility] [id number] Performed at [facility], [address] [place], [place] [address]    Culture GRAM NEGATIVE RODS  Final   Report Status PENDING  Incomplete  Culture, blood (Routine X 2) w Reflex to ID Panel     Status: None (Preliminary result)   Collection Time: 02/02/19  3:07 PM  Result Value Ref Range Status   Specimen Description   Final    BLOOD RIGHT ARM Performed at [facility], [address], [place], [place] [address]    Special Requests   Final    BOTTLES DRAWN AEROBIC ONLY Blood Culture results may not be optimal due to an inadequate volume of blood received in culture bottles Performed at [facility], [address], [place], [place] [address]    Culture   Final    NO GROWTH 4 DAYS Performed at  [facility], [address], [place], [place] [address]    Report Status PENDING  Incomplete  Blood Culture ID Panel (Reflexed)     Status: Abnormal   Collection Time: 02/02/19  3:07 PM  Result Value Ref Range Status   Enterococcus species NOT DETECTED NOT DETECTED Final   Listeria monocytogenes NOT DETECTED NOT DETECTED Final   Staphylococcus species NOT DETECTED NOT DETECTED Final   Staphylococcus aureus (BCID) NOT DETECTED NOT DETECTED Final   Streptococcus species NOT DETECTED NOT DETECTED Final   Streptococcus agalactiae NOT DETECTED NOT DETECTED Final   Streptococcus pneumoniae NOT DETECTED NOT DETECTED Final   Streptococcus pyogenes NOT DETECTED NOT DETECTED Final   Acinetobacter baumannii NOT DETECTED NOT DETECTED Final   Enterobacteriaceae species DETECTED (A) NOT DETECTED Final    Comment: Enterobacteriaceae represent a large family of gram negative bacteria, not a single organism. Refer to culture for further identification. CRITICAL RESULT CALLED TO, READ BACK BY AND VERIFIED WITH: PHARMD M PHAM [phone] MLM    Enterobacter cloacae complex NOT DETECTED NOT DETECTED Final   Escherichia coli NOT DETECTED NOT DETECTED Final   Klebsiella oxytoca NOT DETECTED NOT DETECTED Final   Klebsiella pneumoniae NOT DETECTED NOT DETECTED Final   Proteus species NOT DETECTED NOT DETECTED Final   Serratia marcescens NOT DETECTED NOT DETECTED Final   Carbapenem resistance NOT DETECTED NOT DETECTED Final   Haemophilus influenzae NOT DETECTED NOT DETECTED Final   Neisseria meningitidis NOT DETECTED NOT DETECTED Final   Pseudomonas aeruginosa NOT DETECTED NOT DETECTED Final   Candida albicans NOT DETECTED NOT DETECTED Final   Candida glabrata NOT DETECTED NOT DETECTED Final   Candida krusei NOT DETECTED NOT DETECTED Final   Candida parapsilosis NOT DETECTED NOT DETECTED Final   Candida tropicalis NOT DETECTED NOT DETECTED Final    Comment: Performed at [facility], [address] [place], [place] [address]  MRSA PCR Screening     Status: None   Collection Time: 02/02/19  8:30 PM  Result Value Ref Range Status   MRSA by PCR NEGATIVE NEGATIVE Final    Comment:  The GeneXpert MRSA Assay (FDA approved for NASAL specimens only), is one component of a comprehensive MRSA colonization surveillance program. It is not intended to diagnose MRSA infection nor to guide or monitor treatment for MRSA infections. Performed at [facility], [address] [place], [place] [address]   Culture, blood (Routine X 2) w Reflex to ID Panel     Status: Abnormal   Collection Time: 02/03/19  4:12 PM  Result Value Ref Range Status   Specimen Description BLOOD LEFT FOOT  Final   Special Requests   Final    BOTTLES DRAWN AEROBIC AND ANAEROBIC Blood Culture adequate volume   Culture  Setup Time   Final    GRAM NEGATIVE RODS ANAEROBIC BOTTLE ONLY CRITICAL RESULT CALLED TO, READ BACK BY AND VERIFIED WITH: K AMEND [facility] 2207 02/04/19 A BROWNING Performed at [facility], [address], [place], [place] [address]    Culture CITROBACTER KOSERI (A)  Final   Report Status 02/06/2019 FINAL  Final   Organism ID, Bacteria CITROBACTER KOSERI  Final      Susceptibility   Citrobacter koseri - MIC*    CEFAZOLIN <=4 SENSITIVE Sensitive     CEFEPIME <=1 SENSITIVE Sensitive     CEFTAZIDIME <=1 SENSITIVE Sensitive     CEFTRIAXONE <=1 SENSITIVE Sensitive     CIPROFLOXACIN <=0.25 SENSITIVE Sensitive     GENTAMICIN <=1 SENSITIVE Sensitive     IMIPENEM <=0.25 SENSITIVE Sensitive     TRIMETH/SULFA <=20 SENSITIVE Sensitive     PIP/TAZO <=4 SENSITIVE Sensitive     * CITROBACTER KOSERI  Blood Culture ID Panel (Reflexed)     Status: Abnormal   Collection Time: 02/03/19  4:12 PM  Result Value Ref Range Status   Enterococcus species NOT DETECTED NOT DETECTED Final   Listeria monocytogenes NOT DETECTED NOT DETECTED Final   Staphylococcus species NOT DETECTED NOT DETECTED Final     Staphylococcus aureus (BCID) NOT DETECTED NOT DETECTED Final   Streptococcus species NOT DETECTED NOT DETECTED Final   Streptococcus agalactiae NOT DETECTED NOT DETECTED Final   Streptococcus pneumoniae NOT DETECTED NOT DETECTED Final   Streptococcus pyogenes NOT DETECTED NOT DETECTED Final   Acinetobacter baumannii NOT DETECTED NOT DETECTED Final   Enterobacteriaceae species DETECTED (A) NOT DETECTED Final    Comment: Enterobacteriaceae represent a large family of gram negative bacteria, not a single organism. Refer to culture for further identification. CRITICAL RESULT CALLED TO, READ BACK BY AND VERIFIED WITH: K AMEND PHARMD 2207 02/04/19 A BROWNING    Enterobacter cloacae complex NOT DETECTED NOT DETECTED Final   Escherichia coli NOT DETECTED NOT DETECTED Final   Klebsiella oxytoca NOT DETECTED NOT DETECTED Final   Klebsiella pneumoniae NOT DETECTED NOT DETECTED Final   Proteus species NOT DETECTED NOT DETECTED Final   Serratia marcescens NOT DETECTED NOT DETECTED Final   Carbapenem resistance NOT DETECTED NOT DETECTED Final   Haemophilus influenzae NOT DETECTED NOT DETECTED Final   Neisseria meningitidis NOT DETECTED NOT DETECTED Final   Pseudomonas aeruginosa NOT DETECTED NOT DETECTED Final   Candida albicans NOT DETECTED NOT DETECTED Final   Candida glabrata NOT DETECTED NOT DETECTED Final   Candida krusei NOT DETECTED NOT DETECTED Final   Candida parapsilosis NOT DETECTED NOT DETECTED Final   Candida tropicalis NOT DETECTED NOT DETECTED Final    Comment: Performed at [facility], [address] [place], [place] [address]  Culture, blood (Routine X 2) w Reflex to ID Panel  Status: None (Preliminary result)   Collection Time: 02/03/19  4:24 PM  Result Value Ref Range Status   Specimen Description BLOOD LEFT ANKLE  Final   Special Requests   Final    BOTTLES DRAWN AEROBIC AND ANAEROBIC Blood Culture results may not be optimal due to an excessive volume of blood  received in culture bottles   Culture   Final    NO GROWTH 3 DAYS Performed at [facility], [address] [place], [place] [address]    Report Status PENDING  Incomplete  Gastrointestinal Panel by PCR , Stool     Status: None   Collection Time: 02/04/19  5:10 PM  Result Value Ref Range Status   Campylobacter species NOT DETECTED NOT DETECTED Final   Plesimonas shigelloides NOT DETECTED NOT DETECTED Final   Salmonella species NOT DETECTED NOT DETECTED Final   Yersinia enterocolitica NOT DETECTED NOT DETECTED Final   Vibrio species NOT DETECTED NOT DETECTED Final   Vibrio cholerae NOT DETECTED NOT DETECTED Final   Enteroaggregative E coli (EAEC) NOT DETECTED NOT DETECTED Final   Enteropathogenic E coli (EPEC) NOT DETECTED NOT DETECTED Final   Enterotoxigenic E coli (ETEC) NOT DETECTED NOT DETECTED Final   Shiga like toxin producing E coli (STEC) NOT DETECTED NOT DETECTED Final   Shigella/Enteroinvasive E coli (EIEC) NOT DETECTED NOT DETECTED Final   Cryptosporidium NOT DETECTED NOT DETECTED Final   Cyclospora cayetanensis NOT DETECTED NOT DETECTED Final   Entamoeba histolytica NOT DETECTED NOT DETECTED Final   Giardia lamblia NOT DETECTED NOT DETECTED Final   Adenovirus F40/41 NOT DETECTED NOT DETECTED Final   Astrovirus NOT DETECTED NOT DETECTED Final   Norovirus GI/GII NOT DETECTED NOT DETECTED Final   Rotavirus A NOT DETECTED NOT DETECTED Final   Sapovirus (I, II, IV, and V) NOT DETECTED NOT DETECTED Final    Comment: Performed at [facility], [address] [place], [place] [address]  Expectorated sputum assessment w rflx to resp cult     Status: None   Collection Time: 02/05/19  5:10 PM  Result Value Ref Range Status   Specimen Description EXPECTORATED SPUTUM  Final   Special Requests NONE  Final   Sputum evaluation   Final    THIS SPECIMEN IS ACCEPTABLE FOR SPUTUM CULTURE Performed at [facility], [address] [place], [place] [address]     Report Status 02/05/2019 FINAL  Final  Culture, respiratory     Status: None (Preliminary result)   Collection Time: 02/05/19  5:10 PM  Result Value Ref Range Status   Specimen Description EXPECTORATED SPUTUM  Final   Special Requests NONE Reflexed from [id number]  Final   Gram Stain   Final    ABUNDANT WBC PRESENT,BOTH PMN AND MONONUCLEAR ABUNDANT GRAM POSITIVE COCCI ABUNDANT GRAM NEGATIVE RODS Performed at [facility], [address] [place], [place] [address]    Culture PENDING  Incomplete   Report Status PENDING  Incomplete         Radiology Studies: Ct Soft Tissue Neck W Contrast  Result Date: 02/05/2019 CLINICAL DATA:  58 y/o  F; right eye swelling. EXAM: CT NECK WITH CONTRAST TECHNIQUE: Multidetector CT imaging of the neck was performed using the standard protocol following the bolus administration of intravenous contrast. CONTRAST:  100mL ISOVUE-370 IOPAMIDOL (ISOVUE-370) INJECTION 76% COMPARISON:  02/02/2019 MRV of the head. FINDINGS: Pharynx and larynx: Normal. No mass or swelling. Salivary glands: No inflammation, mass, or stone. Thyroid: Normal. Lymph  nodes: None enlarged or abnormal density. Vascular: Negative. Patent left transverse, sigmoid, internal jugular venous system, small in caliber. Absent flow related signal and prior MRV of the head is likely due to relatively lower flow in the left system in comparison with the right. Limited intracranial: Negative. Visualized orbits: No orbital mass or inflammatory process. Edema within the right periorbital compartment. Mastoids and visualized paranasal sinuses: Clear. Skeleton: Mild spondylosis of the cervical spine. No high-grade bony spinal canal stenosis. Upper chest: Mild biapical pleuroparenchymal scarring. Small left pleural effusion. Other: None. IMPRESSION: 1. Right periorbital compartment edema, possibly infectious or inflammatory. No orbital compartment mass or inflammatory process. No abscess. 2. Small left pleural  effusion. 3. Mild spondylosis of the cervical spine. Electronically Signed   By: Mitzi Hansen M.D.   On: 02/05/2019 22:52   Dg Chest Port 1 View  Result Date: 02/05/2019 CLINICAL DATA:  Acute respiratory failure EXAM: PORTABLE CHEST 1 VIEW COMPARISON:  Chest CT 3 days ago FINDINGS: Interval hazy density in the left base. No edema, definite effusion, or pneumothorax. Normal heart size IMPRESSION: New hazy opacity at the left base that could be atelectasis or infection. Electronically Signed   By: Marnee Spring M.D.   On: 02/05/2019 06:52   Vas [place] Lower Extremity Venous (dvt)  Result Date: 02/05/2019  Lower Venous Study Indications: D dimer.  Performing Technologist: Jeb Levering RDMS, RVT  Examination Guidelines: A complete evaluation includes B-mode imaging, spectral Doppler, color Doppler, and power Doppler as needed of all accessible portions of each vessel. Bilateral testing is considered an integral part of a complete examination. Limited examinations for reoccurring indications may be performed as noted.  Right Venous Findings: +---------+---------------+---------+-----------+----------+-------+          CompressibilityPhasicitySpontaneityPropertiesSummary +---------+---------------+---------+-----------+----------+-------+ CFV      Full           Yes      Yes                          +---------+---------------+---------+-----------+----------+-------+ SFJ      Full                                                 +---------+---------------+---------+-----------+----------+-------+ FV Prox  Full                                                 +---------+---------------+---------+-----------+----------+-------+ FV Mid   Full                                                 +---------+---------------+---------+-----------+----------+-------+ FV DistalFull                                                  +---------+---------------+---------+-----------+----------+-------+ PFV      Full                                                 +---------+---------------+---------+-----------+----------+-------+  POP      Full           Yes      Yes                          +---------+---------------+---------+-----------+----------+-------+ PTV      Full                                                 +---------+---------------+---------+-----------+----------+-------+ PERO     Full                                                 +---------+---------------+---------+-----------+----------+-------+  Left Venous Findings: +---------+---------------+---------+-----------+----------+-------+          CompressibilityPhasicitySpontaneityPropertiesSummary +---------+---------------+---------+-----------+----------+-------+ CFV      Full           Yes      Yes                          +---------+---------------+---------+-----------+----------+-------+ SFJ      Full                                                 +---------+---------------+---------+-----------+----------+-------+ FV Prox  Full                                                 +---------+---------------+---------+-----------+----------+-------+ FV Mid   Full                                                 +---------+---------------+---------+-----------+----------+-------+ FV DistalFull                                                 +---------+---------------+---------+-----------+----------+-------+ PFV      Full                                                 +---------+---------------+---------+-----------+----------+-------+ POP      Full           Yes      Yes                          +---------+---------------+---------+-----------+----------+-------+ PTV      Full                                                  +---------+---------------+---------+-----------+----------+-------+  PERO     Full                                                 +---------+---------------+---------+-----------+----------+-------+    Summary: Right: There is no evidence of deep vein thrombosis in the lower extremity. No cystic structure found in the popliteal fossa. Left: There is no evidence of deep vein thrombosis in the lower extremity. No cystic structure found in the popliteal fossa.  *See table(s) above for measurements and observations. Electronically signed by Lemar LivingsBrandon Cain MD on 02/05/2019 at 5:57:33 PM.    Final    Ct Angio Abd/pel W/ And/or W/o  Result Date: 02/05/2019 CLINICAL DATA:  Recent EGD demonstrates ischemic changes of the stomach, esophagus and duodenum. EXAM: CTA ABDOMEN AND PELVIS WITHOUT AND WITH CONTRAST TECHNIQUE: Multidetector CT imaging of the abdomen and pelvis was performed using the standard protocol during bolus administration of intravenous contrast. Multiplanar reconstructed images and MIPs were obtained and reviewed to evaluate the vascular anatomy. CONTRAST:  100mL ISOVUE-370 IOPAMIDOL (ISOVUE-370) INJECTION 76% COMPARISON:  CT abdomen 02/02/2018 FINDINGS: VASCULAR Aorta: Normal caliber aorta without aneurysm, dissection, vasculitis or significant stenosis. Celiac: Patent without evidence of aneurysm, dissection, vasculitis or significant stenosis. SMA: Patent without evidence of aneurysm, dissection, vasculitis or significant stenosis. Renals: Both renal arteries are patent without evidence of aneurysm, dissection, vasculitis, fibromuscular dysplasia or significant stenosis. IMA: Patent without evidence of aneurysm, dissection, vasculitis or significant stenosis. Inflow: Patent without evidence of aneurysm, dissection, vasculitis or significant stenosis. Proximal Outflow: Bilateral common femoral and visualized portions of the superficial and profunda femoral arteries are patent without evidence of  aneurysm, dissection, vasculitis or significant stenosis. Veins: No obvious venous abnormality within the limitations of this arterial phase study. Review of the MIP images confirms the above findings. NON-VASCULAR Lower chest: Small left pleural effusion.  Bibasilar atelectasis. Hepatobiliary: No focal liver abnormality is seen. No gallstones, gallbladder wall thickening, or biliary dilatation. Pancreas: Unremarkable. No pancreatic ductal dilatation or surrounding inflammatory changes. Spleen: Normal in size without focal abnormality. Adrenals/Urinary Tract: Adrenal glands are unremarkable. Bilateral moderate hydroureteronephrosis likely resulting from severe bladder distension. No urolithiasis. Overall abnormal left nephrogram with wedge-shaped areas of low density in the left upper pole of the kidney with small amount of perinephric fluid and hemorrhage. Multiple hypodense, fluid attenuating renal masses bilaterally most consistent with cysts. Stomach/Bowel: Stomach is within normal limits. No evidence of bowel wall thickening, distention, or inflammatory changes. No pneumatosis, pneumoperitoneum or portal venous gas. Lymphatic: Abdominal aortic atherosclerosis. Normal caliber abdominal aorta. No lymphadenopathy. Reproductive: Uterus and bilateral adnexa are unremarkable. Other: No abdominal wall hernia or abnormality. No abdominopelvic ascites. Musculoskeletal: No acute osseous abnormality. No aggressive osseous lesion. Degenerative disc disease with disc height loss L4-5 with bilateral facet arthropathy. IMPRESSION: VASCULAR 1. No arterial or venous thrombosis within the abdomen or pelvis. NON-VASCULAR 1. Overall abnormal left nephrogram with wedge-shaped areas of low density in the left upper pole of the kidney with small amount of perinephric fluid and hemorrhage. Differential considerations include pyelonephritis from typical etiology or atypical etiologies such as TB versus infiltrating lesions such as  lymphoma versus less likely renal infarct. This appearance can also be seen in the setting of trauma, but no apparent history of trauma is noted within the chart. 2. Bilateral moderate hydroureteronephrosis likely resulting from severe bladder distension or vesicoureteral reflux. 3.  Small left pleural effusion.  Bibasilar atelectasis. Electronically Signed   By: Elige Ko   On: 02/05/2019 23:27        Scheduled Meds: . sodium chloride   Intravenous Once  . chlorhexidine  15 mL Mouth Rinse BID  . mouth rinse  15 mL Mouth Rinse q12n4p  . nicotine  14 mg Transdermal Daily  . [START ON 02/09/2019] pantoprazole  40 mg Intravenous Q12H  . polyethylene glycol  17 g Oral BID  . potassium chloride  40 mEq Oral BID   Continuous Infusions: . acetaminophen Stopped (02/05/19 2250)  . ceFEPime (MAXIPIME) IV 2 g (02/06/19 1458)  . pantoprozole (PROTONIX) infusion 8 mg/hr (02/06/19 1300)     LOS: 4 days    Time spent: 58 Minutes    Berton Mount, MD  [facility] Pager #: [phone] 7PM-7AM contact night coverage as above

## 2019-02-06 NOTE — Progress Notes (Signed)
Patient is requesting pain medication after receiving Ofirmev IV earlier at the beginning of my shift, but wants something stronger. She does have PRN Percocet however, she is on strict NPO. Elink MD was called to confirm if it is ok with him to give patient the PRN PO pain medication.

## 2019-02-06 NOTE — Consult Note (Signed)
Reason for Consult: facial swelling   Sarah Fry is an 58 y.o. female admitted 02/02/19 for facial and tongue swelling of uncertain etiology. Patient reports she had upper left second molar out approximately 2 weeks PTA. No complications from extraction. [facility] [place] [facility]. Developed swelling of face and tongue 02/02/19. Not improved with Epi pen x 2. Seen in ER and admitted. Diagnosed with Gram negative bacteremia on Cefepime.  EGD 02/05/19 dx'd severely ulcerated esophagus.   Today Feeling better. Less face swelling but tongue swollen and ulcerated. Painful oral but relieved with Peridex rinse.    Past Medical History:  Diagnosis Date  . Kidney stones     Past Surgical History:  Procedure Laterality Date  . ESOPHAGOGASTRODUODENOSCOPY (EGD) WITH PROPOFOL N/A 02/05/2019   Procedure: ESOPHAGOGASTRODUODENOSCOPY (EGD) WITH PROPOFOL;  Surgeon: Charlott RakesSchooler, Vincent, MD;  [facility];  Service: Endoscopy;  Laterality: N/A;    History reviewed. No pertinent family history.  Social History:  reports that she has been smoking. She has never used smokeless tobacco. She reports that she does not drink alcohol or use drugs.  Allergies:  Allergies  Allergen Reactions  . Flexeril [Cyclobenzaprine] Anaphylaxis    Medications: I have reviewed the patient's current medications.  Results for orders placed or performed during the hospital encounter of 02/02/19 (from the past 48 hour(s))  Complement, total     Status: None   Collection Time: 02/04/19 10:24 AM  Result Value Ref Range   Compl, Total (CH50) >60 >41 U/mL    Comment: (NOTE)             Age                Female          Female          1 - 30 days         Not Estab.     Not Estab.    31 days -  6 months          >32            >20   7 months - 17 years           >39            >39             >17 years           >41            >41 **NOTE: The adult (">17 years") reference interval**         range is  used to flag abnormals on this         report. If the patient is 58 years old or         younger, use the table above to determine         out of range values. Performed At: [facility] [address] [place], [place] [id number] Jolene SchimkeNagendra Sanjai MD WU:[id number]:[phone]   Comprehensive metabolic panel     Status: Abnormal   Collection Time: 02/04/19 10:24 AM  Result Value Ref Range   Sodium 140 135 - 145 mmol/L   Potassium 2.4 (LL) 3.5 - 5.1 mmol/L    Comment: CRITICAL RESULT CALLED TO, READ BACK BY AND VERIFIED WITH: C.TURNER,RN 1128 02/04/2019 CLARK,S    Chloride 93 (L) 98 - 111 mmol/L   CO2 35 (H) 22 - 32 mmol/L   Glucose, Bld 116 (H)  70 - 99 mg/dL   BUN 21 (H) 6 - 20 mg/dL   Creatinine, Ser 1.61 0.44 - 1.00 mg/dL   Calcium 7.3 (L) 8.9 - 10.3 mg/dL   Total Protein 4.7 (L) 6.5 - 8.1 g/dL   Albumin 1.9 (L) 3.5 - 5.0 g/dL   AST 51 (H) 15 - 41 U/L   ALT 29 0 - 44 U/L   Alkaline Phosphatase 70 38 - 126 U/L   Total Bilirubin 0.5 0.3 - 1.2 mg/dL   GFR calc non Af Amer >60 >60 mL/min   GFR calc Af Amer >60 >60 mL/min   Anion gap 12 5 - 15    Comment: Performed at [facility], [address] [place], [place] [address]  [place]     Status: Abnormal   Collection Time: 02/04/19 10:24 AM  Result Value Ref Range   Total CK 261 (H) 38 - 234 U/L    Comment: Performed at [facility], [address] [place], [place] [address]  Gastrointestinal Panel by PCR , Stool     Status: None   Collection Time: 02/04/19  5:10 PM  Result Value Ref Range   Campylobacter species NOT DETECTED NOT DETECTED   Plesimonas shigelloides NOT DETECTED NOT DETECTED   Salmonella species NOT DETECTED NOT DETECTED   Yersinia enterocolitica NOT DETECTED NOT DETECTED   Vibrio species NOT DETECTED NOT DETECTED   Vibrio cholerae NOT DETECTED NOT DETECTED   Enteroaggregative E coli (EAEC) NOT DETECTED NOT DETECTED   Enteropathogenic E coli (EPEC) NOT DETECTED NOT DETECTED   Enterotoxigenic E coli (ETEC) NOT  DETECTED NOT DETECTED   Shiga like toxin producing E coli (STEC) NOT DETECTED NOT DETECTED   Shigella/Enteroinvasive E coli (EIEC) NOT DETECTED NOT DETECTED   Cryptosporidium NOT DETECTED NOT DETECTED   Cyclospora cayetanensis NOT DETECTED NOT DETECTED   Entamoeba histolytica NOT DETECTED NOT DETECTED   Giardia lamblia NOT DETECTED NOT DETECTED   Adenovirus F40/41 NOT DETECTED NOT DETECTED   Astrovirus NOT DETECTED NOT DETECTED   Norovirus GI/GII NOT DETECTED NOT DETECTED   Rotavirus A NOT DETECTED NOT DETECTED   Sapovirus (I, II, IV, and V) NOT DETECTED NOT DETECTED    Comment: Performed at [facility], [address] [place], [place] [address]  Miscellaneous [facility] test (send-out)     Status: None   Collection Time: 02/04/19  5:10 PM  Result Value Ref Range   [facility] test code [id number]     Comment: CORRECTED ON 02/05 AT 1807: PREVIOUSLY REPORTED AS STOOL   [facility] test name EHEC     Comment: Performed at [facility], [address], [place], [place] [address] CORRECTED ON 02/05 AT 1807: PREVIOUSLY REPORTED AS STOOL    Misc [facility] result COMMENT     Comment: (NOTE) Performed At: [facility] [address] [place], [place] [id number] Jolene Schimke MD [id number]   CBC     Status: Abnormal   Collection Time: 02/05/19  2:51 AM  Result Value Ref Range   WBC 7.2 4.0 - 10.5 K/uL   RBC 3.48 (L) 3.87 - 5.11 MIL/uL   Hemoglobin 10.3 (L) 12.0 - 15.0 g/dL   HCT 02.7 (L) 25.3 - 66.4 %   MCV 87.6 80.0 - 100.0 fL   MCH 29.6 26.0 - 34.0 pg   MCHC 33.8 30.0 - 36.0 g/dL   RDW 40.3 47.4 - 25.9 %   Platelets 44 (L) 150 - 400 K/uL  Comment: REPEATED TO VERIFY Immature Platelet Fraction may be clinically indicated, consider ordering this additional test [id number] CONSISTENT WITH PREVIOUS RESULT    nRBC 0.0 0.0 - 0.2 %    Comment: Performed at [facility], [address] [place], [place] [address]  Magnesium     Status: None   Collection Time:  02/05/19  2:51 AM  Result Value Ref Range   Magnesium 2.1 1.7 - 2.4 mg/dL    Comment: Performed at [facility], [address] [place], [place] [address]  Phosphorus     Status: None   Collection Time: 02/05/19  2:51 AM  Result Value Ref Range   Phosphorus 2.6 2.5 - 4.6 mg/dL    Comment: Performed at [facility], [address] [place], [place] [address]  Comprehensive metabolic panel     Status: Abnormal   Collection Time: 02/05/19  2:51 AM  Result Value Ref Range   Sodium 138 135 - 145 mmol/L   Potassium 3.3 (L) 3.5 - 5.1 mmol/L    Comment: DELTA CHECK NOTED   Chloride 99 98 - 111 mmol/L   CO2 29 22 - 32 mmol/L   Glucose, Bld 100 (H) 70 - 99 mg/dL   BUN 17 6 - 20 mg/dL   Creatinine, Ser 3.29 0.44 - 1.00 mg/dL   Calcium 7.5 (L) 8.9 - 10.3 mg/dL   Total Protein 4.7 (L) 6.5 - 8.1 g/dL   Albumin 1.9 (L) 3.5 - 5.0 g/dL   AST 55 (H) 15 - 41 U/L   ALT 31 0 - 44 U/L   Alkaline Phosphatase 63 38 - 126 U/L   Total Bilirubin 0.9 0.3 - 1.2 mg/dL   GFR calc non Af Amer >60 >60 mL/min   GFR calc Af Amer >60 >60 mL/min   Anion gap 10 5 - 15    Comment: Performed at [facility], [address], [place], [place] [address]  Technologist smear review     Status: None   Collection Time: 02/05/19  2:51 AM  Result Value Ref Range   Tech Review MORPHOLOGY UNREMARKABLE     Comment: NO SCHISTOCYTES SEEN Performed at [facility], [address], [place], [place] [address]   Glucose, capillary     Status: None   Collection Time: 02/05/19  7:38 AM  Result Value Ref Range   Glucose-Capillary 80 70 - 99 mg/dL  Prepare Pheresed Platelets     Status: None   Collection Time: 02/05/19  8:19 AM  Result Value Ref Range   Unit Number [id number]    Blood Component Type PLTP LR2 PAS    Unit division 00    Status of Unit ISSUED,FINAL    Transfusion Status      OK TO TRANSFUSE Performed at [facility], [address] [place], [place] [address]   Expectorated  sputum assessment w rflx to resp cult     Status: None   Collection Time: 02/05/19  5:10 PM  Result Value Ref Range   Specimen Description EXPECTORATED SPUTUM    Special Requests NONE    Sputum evaluation      THIS SPECIMEN IS ACCEPTABLE FOR SPUTUM CULTURE Performed at [facility], [address], [place], [place] [address]    Report Status 02/05/2019 FINAL   Culture, respiratory     Status: None (Preliminary result)   Collection Time: 02/05/19  5:10 PM  Result Value Ref Range  Specimen Description EXPECTORATED SPUTUM    Special Requests NONE Reflexed from [id number]    Gram Stain      ABUNDANT WBC PRESENT,BOTH PMN AND MONONUCLEAR ABUNDANT GRAM POSITIVE COCCI ABUNDANT GRAM NEGATIVE RODS Performed at [facility], [address] [place], [place] [address]    Culture PENDING    Report Status PENDING   Urinalysis, Routine w reflex microscopic     Status: Abnormal   Collection Time: 02/05/19  5:15 PM  Result Value Ref Range   Color, Urine YELLOW YELLOW   APPearance CLEAR CLEAR   Specific Gravity, Urine 1.011 1.005 - 1.030   pH 9.0 (H) 5.0 - 8.0   Glucose, UA NEGATIVE NEGATIVE mg/dL   Hgb urine dipstick LARGE (A) NEGATIVE   Bilirubin Urine NEGATIVE NEGATIVE   Ketones, ur 20 (A) NEGATIVE mg/dL   Protein, ur 30 (A) NEGATIVE mg/dL   Nitrite NEGATIVE NEGATIVE   Leukocytes, UA LARGE (A) NEGATIVE   RBC / HPF >50 (H) 0 - 5 RBC/hpf   WBC, UA 21-50 0 - 5 WBC/hpf   Bacteria, UA MANY (A) NONE SEEN   Squamous Epithelial / LPF 0-5 0 - 5   Triple Phosphate Crystal PRESENT    Non Squamous Epithelial 0-5 (A) NONE SEEN    Comment: Performed at [facility], [address], [place], [place] [address]  Lactate dehydrogenase     Status: Abnormal   Collection Time: 02/06/19  6:50 AM  Result Value Ref Range   LDH 313 (H) 98 - 192 U/L    Comment: Performed at [facility], [address] [place], [place] [address]  CK     Status: None   Collection Time: 02/06/19  6:50 AM   Result Value Ref Range   Total CK 168 38 - 234 U/L    Comment: Performed at [facility], [address], [place], [place] [address]  Comprehensive metabolic panel     Status: Abnormal   Collection Time: 02/06/19  6:50 AM  Result Value Ref Range   Sodium 144 135 - 145 mmol/L   Potassium 3.0 (L) 3.5 - 5.1 mmol/L   Chloride 108 98 - 111 mmol/L   CO2 23 22 - 32 mmol/L   Glucose, Bld 80 70 - 99 mg/dL   BUN 20 6 - 20 mg/dL   Creatinine, Ser 2.95 0.44 - 1.00 mg/dL   Calcium 7.6 (L) 8.9 - 10.3 mg/dL   Total Protein 4.8 (L) 6.5 - 8.1 g/dL   Albumin 1.9 (L) 3.5 - 5.0 g/dL   AST 36 15 - 41 U/L   ALT 24 0 - 44 U/L   Alkaline Phosphatase 53 38 - 126 U/L   Total Bilirubin 1.4 (H) 0.3 - 1.2 mg/dL   GFR calc non Af Amer >60 >60 mL/min   GFR calc Af Amer >60 >60 mL/min   Anion gap 13 5 - 15    Comment: Performed at [facility], [address], [place], [place] [address]    Ct Soft Tissue Neck W Contrast  Result Date: 02/05/2019 CLINICAL DATA:  58 y/o  F; right eye swelling. EXAM: CT NECK WITH CONTRAST TECHNIQUE: Multidetector CT imaging of the neck was performed using the standard protocol following the bolus administration of intravenous contrast. CONTRAST:  ISOVUE-370 IOPAMIDOL (ISOVUE-370) INJECTION 76% COMPARISON:  02/02/2019 MRV of the head. FINDINGS: Pharynx and larynx: Normal. No mass or swelling. Salivary glands: No inflammation, mass, or stone. Thyroid:  Normal. Lymph nodes: None enlarged or abnormal density. Vascular: Negative. Patent left transverse, sigmoid, internal jugular venous system, small in caliber. Absent flow related signal and prior MRV of the head is likely due to relatively lower flow in the left system in comparison with the right. Limited intracranial: Negative. Visualized orbits: No orbital mass or inflammatory process. Edema within the right periorbital compartment. Mastoids and visualized paranasal sinuses: Clear. Skeleton: Mild spondylosis of the cervical  spine. No high-grade bony spinal canal stenosis. Upper chest: Mild biapical pleuroparenchymal scarring. Small left pleural effusion. Other: None. IMPRESSION: 1. Right periorbital compartment edema, possibly infectious or inflammatory. No orbital compartment mass or inflammatory process. No abscess. 2. Small left pleural effusion. 3. Mild spondylosis of the cervical spine. Electronically Signed   By: Mitzi Hansen M.D.   On: 02/05/2019 22:52   Dg Chest Port 1 View  Result Date: 02/05/2019 CLINICAL DATA:  Acute respiratory failure EXAM: PORTABLE CHEST 1 VIEW COMPARISON:  Chest CT 3 days ago FINDINGS: Interval hazy density in the left base. No edema, definite effusion, or pneumothorax. Normal heart size IMPRESSION: New hazy opacity at the left base that could be atelectasis or infection. Electronically Signed   By: Marnee Spring M.D.   On: 02/05/2019 06:52   Vas [place] Lower Extremity Venous (dvt)  Result Date: 02/05/2019  Lower Venous Study Indications: D dimer.  Performing Technologist: Jeb Levering RDMS, RVT  Examination Guidelines: A complete evaluation includes B-mode imaging, spectral Doppler, color Doppler, and power Doppler as needed of all accessible portions of each vessel. Bilateral testing is considered an integral part of a complete examination. Limited examinations for reoccurring indications may be performed as noted.  Right Venous Findings: +---------+---------------+---------+-----------+----------+-------+          CompressibilityPhasicitySpontaneityPropertiesSummary +---------+---------------+---------+-----------+----------+-------+ CFV      Full           Yes      Yes                          +---------+---------------+---------+-----------+----------+-------+ SFJ      Full                                                 +---------+---------------+---------+-----------+----------+-------+ FV Prox  Full                                                  +---------+---------------+---------+-----------+----------+-------+ FV Mid   Full                                                 +---------+---------------+---------+-----------+----------+-------+ FV DistalFull                                                 +---------+---------------+---------+-----------+----------+-------+ PFV      Full                                                 +---------+---------------+---------+-----------+----------+-------+  POP      Full           Yes      Yes                          +---------+---------------+---------+-----------+----------+-------+ PTV      Full                                                 +---------+---------------+---------+-----------+----------+-------+ PERO     Full                                                 +---------+---------------+---------+-----------+----------+-------+  Left Venous Findings: +---------+---------------+---------+-----------+----------+-------+          CompressibilityPhasicitySpontaneityPropertiesSummary +---------+---------------+---------+-----------+----------+-------+ CFV      Full           Yes      Yes                          +---------+---------------+---------+-----------+----------+-------+ SFJ      Full                                                 +---------+---------------+---------+-----------+----------+-------+ FV Prox  Full                                                 +---------+---------------+---------+-----------+----------+-------+ FV Mid   Full                                                 +---------+---------------+---------+-----------+----------+-------+ FV DistalFull                                                 +---------+---------------+---------+-----------+----------+-------+ PFV      Full                                                  +---------+---------------+---------+-----------+----------+-------+ POP      Full           Yes      Yes                          +---------+---------------+---------+-----------+----------+-------+ PTV      Full                                                 +---------+---------------+---------+-----------+----------+-------+  PERO     Full                                                 +---------+---------------+---------+-----------+----------+-------+    Summary: Right: There is no evidence of deep vein thrombosis in the lower extremity. No cystic structure found in the popliteal fossa. Left: There is no evidence of deep vein thrombosis in the lower extremity. No cystic structure found in the popliteal fossa.  *See table(s) above for measurements and observations. Electronically signed by Lemar Livings MD on 02/05/2019 at 5:57:33 PM.    Final    Ct Angio Abd/pel W/ And/or W/o  Result Date: 02/05/2019 CLINICAL DATA:  Recent EGD demonstrates ischemic changes of the stomach, esophagus and duodenum. EXAM: CTA ABDOMEN AND PELVIS WITHOUT AND WITH CONTRAST TECHNIQUE: Multidetector CT imaging of the abdomen and pelvis was performed using the standard protocol during bolus administration of intravenous contrast. Multiplanar reconstructed images and MIPs were obtained and reviewed to evaluate the vascular anatomy. CONTRAST:  ISOVUE-370 IOPAMIDOL (ISOVUE-370) INJECTION 76% COMPARISON:  CT abdomen 02/02/2018 FINDINGS: VASCULAR Aorta: Normal caliber aorta without aneurysm, dissection, vasculitis or significant stenosis. Celiac: Patent without evidence of aneurysm, dissection, vasculitis or significant stenosis. SMA: Patent without evidence of aneurysm, dissection, vasculitis or significant stenosis. Renals: Both renal arteries are patent without evidence of aneurysm, dissection, vasculitis, fibromuscular dysplasia or significant stenosis. IMA: Patent without evidence of aneurysm, dissection,  vasculitis or significant stenosis. Inflow: Patent without evidence of aneurysm, dissection, vasculitis or significant stenosis. Proximal Outflow: Bilateral common femoral and visualized portions of the superficial and profunda femoral arteries are patent without evidence of aneurysm, dissection, vasculitis or significant stenosis. Veins: No obvious venous abnormality within the limitations of this arterial phase study. Review of the MIP images confirms the above findings. NON-VASCULAR Lower chest: Small left pleural effusion.  Bibasilar atelectasis. Hepatobiliary: No focal liver abnormality is seen. No gallstones, gallbladder wall thickening, or biliary dilatation. Pancreas: Unremarkable. No pancreatic ductal dilatation or surrounding inflammatory changes. Spleen: Normal in size without focal abnormality. Adrenals/Urinary Tract: Adrenal glands are unremarkable. Bilateral moderate hydroureteronephrosis likely resulting from severe bladder distension. No urolithiasis. Overall abnormal left nephrogram with wedge-shaped areas of low density in the left upper pole of the kidney with small amount of perinephric fluid and hemorrhage. Multiple hypodense, fluid attenuating renal masses bilaterally most consistent with cysts. Stomach/Bowel: Stomach is within normal limits. No evidence of bowel wall thickening, distention, or inflammatory changes. No pneumatosis, pneumoperitoneum or portal venous gas. Lymphatic: Abdominal aortic atherosclerosis. Normal caliber abdominal aorta. No lymphadenopathy. Reproductive: Uterus and bilateral adnexa are unremarkable. Other: No abdominal wall hernia or abnormality. No abdominopelvic ascites. Musculoskeletal: No acute osseous abnormality. No aggressive osseous lesion. Degenerative disc disease with disc height loss L4-5 with bilateral facet arthropathy. IMPRESSION: VASCULAR 1. No arterial or venous thrombosis within the abdomen or pelvis. NON-VASCULAR 1. Overall abnormal left nephrogram  with wedge-shaped areas of low density in the left upper pole of the kidney with small amount of perinephric fluid and hemorrhage. Differential considerations include pyelonephritis from typical etiology or atypical etiologies such as TB versus infiltrating lesions such as lymphoma versus less likely renal infarct. This appearance can also be seen in the setting of trauma, but no apparent history of trauma is noted within the chart. 2. Bilateral moderate hydroureteronephrosis likely resulting from severe bladder distension or vesicoureteral reflux. 3.  Small left pleural effusion.  Bibasilar atelectasis. Electronically Signed   By: Elige KoHetal  Patel   On: 02/05/2019 23:27    ROS Blood pressure 133/76, pulse 71, temperature 98 F (36.7 C), temperature source Oral, resp. rate 15, height 5\' 5"  (1.651 m), weight 54.1 kg, SpO2 100 %. General appearance: alert, cooperative and no distress Head: Normocephalic, without obvious abnormality, atraumatic Eyes: negative Throat: Moderately edematous tongue with multiple ulcerative lesions. Minimal perioral edema. Extraction site # 15 healing normally.  No fluctuance, purulence or trismus. Pharynx clear. No obvious caries or periodontal concerns. Minimal facial edema. Neck: no adenopathy, supple, symmetrical, trachea midline and thyroid not enlarged, symmetric, no tenderness/mass/nodules  Assessment/Plan: [age] F with gram negative bacteremia, esophageal ulcers, facial, tongue,and extremity edema of uncertain origin. Do not see any dental etiology on exam or CT. Patient much improved since admission by report. Please contact me if I can be of additional help. Thanks   [employer] PetroleumScott Joylyn Duggin 02/06/2019, 10:06 AM

## 2019-02-06 NOTE — Progress Notes (Signed)
COURTESY NOTE: Eager to eat. CT abd/pelvis show L perinephric changes-- this is where the patient's pain started (while in [place]). Not clear of history of renal stones sufficient explanation. I suggest CT chest w contrast prior to d/c.  However patient continues to improve. Will follow peripherally from this point.  GM

## 2019-02-07 DIAGNOSIS — A419 Sepsis, unspecified organism: Secondary | ICD-10-CM

## 2019-02-07 DIAGNOSIS — E876 Hypokalemia: Secondary | ICD-10-CM

## 2019-02-07 LAB — RENAL FUNCTION PANEL
Albumin: 1.9 g/dL — ABNORMAL LOW (ref 3.5–5.0)
Anion gap: 11 (ref 5–15)
BUN: 12 mg/dL (ref 6–20)
CO2: 23 mmol/L (ref 22–32)
Calcium: 7.4 mg/dL — ABNORMAL LOW (ref 8.9–10.3)
Chloride: 103 mmol/L (ref 98–111)
Creatinine, Ser: 0.56 mg/dL (ref 0.44–1.00)
GFR calc Af Amer: >60 mL/min (ref >60)
GFR calc non Af Amer: >60 mL/min (ref >60)
Glucose, Bld: 100 mg/dL — ABNORMAL HIGH (ref 70–99)
Phosphorus: 2.8 mg/dL (ref 2.5–4.6)
Potassium: 3 mmol/L — ABNORMAL LOW (ref 3.5–5.1)
Sodium: 137 mmol/L (ref 135–145)

## 2019-02-07 LAB — URINE CULTURE: Culture: <10000 — AB

## 2019-02-07 LAB — CBC WITH DIFFERENTIAL/PLATELET
Abs Immature Granulocytes: 0.07 10*3/uL (ref 0.00–0.07)
Basophils Absolute: 0 10*3/uL (ref 0.0–0.1)
Basophils Relative: 0 %
Eosinophils Absolute: 0.1 10*3/uL (ref 0.0–0.5)
Eosinophils Relative: 1 %
HCT: 29.6 % — ABNORMAL LOW (ref 36.0–46.0)
Hemoglobin: 9.7 g/dL — ABNORMAL LOW (ref 12.0–15.0)
Immature Granulocytes: 1 %
Lymphocytes Relative: 8 %
Lymphs Abs: 0.8 10*3/uL (ref 0.7–4.0)
MCH: 29 pg (ref 26.0–34.0)
MCHC: 32.8 g/dL (ref 30.0–36.0)
MCV: 88.4 fL (ref 80.0–100.0)
Monocytes Absolute: 0.5 10*3/uL (ref 0.1–1.0)
Monocytes Relative: 5 %
Neutro Abs: 9 10*3/uL — ABNORMAL HIGH (ref 1.7–7.7)
Neutrophils Relative %: 85 %
Platelets: 124 10*3/uL — ABNORMAL LOW (ref 150–400)
RBC: 3.35 MIL/uL — ABNORMAL LOW (ref 3.87–5.11)
RDW: 14.3 % (ref 11.5–15.5)
WBC: 10.5 10*3/uL (ref 4.0–10.5)
nRBC: 0 % (ref 0.0–0.2)

## 2019-02-07 LAB — CULTURE, BLOOD (ROUTINE X 2): Culture: NO GROWTH

## 2019-02-07 LAB — MAGNESIUM: Magnesium: 1.7 mg/dL (ref 1.7–2.4)

## 2019-02-07 LAB — CEA: CEA: 3.7 ng/mL (ref 0.0–4.7)

## 2019-02-07 MED ORDER — MAGNESIUM SULFATE IN D5W 1-5 GM/100ML-% IV SOLN
1.0000 g | Freq: Once | INTRAVENOUS | Status: AC
  Administered 2019-02-07: 1 g via INTRAVENOUS
  Filled 2019-02-07: qty 100

## 2019-02-07 MED ORDER — POTASSIUM CHLORIDE CRYS ER 20 MEQ PO TBCR
40.0000 meq | EXTENDED_RELEASE_TABLET | Freq: Two times a day (BID) | ORAL | Status: DC
  Administered 2019-02-07 – 2019-02-08 (×2): 40 meq via ORAL
  Filled 2019-02-07 (×2): qty 2

## 2019-02-07 MED ORDER — SUCRALFATE 1 GM/10ML PO SUSP
1.0000 g | Freq: Three times a day (TID) | ORAL | Status: DC
  Administered 2019-02-07 – 2019-02-08 (×5): 1 g via ORAL
  Filled 2019-02-07 (×5): qty 10

## 2019-02-07 MED ORDER — POTASSIUM CHLORIDE CRYS ER 20 MEQ PO TBCR
40.0000 meq | EXTENDED_RELEASE_TABLET | ORAL | Status: AC
  Administered 2019-02-07 (×2): 40 meq via ORAL
  Filled 2019-02-07 (×2): qty 2

## 2019-02-07 MED ORDER — SODIUM CHLORIDE 0.9 % IV SOLN
2.0000 g | INTRAVENOUS | Status: DC
  Administered 2019-02-07 – 2019-02-08 (×2): 2 g via INTRAVENOUS
  Filled 2019-02-07 (×2): qty 20

## 2019-02-07 NOTE — Progress Notes (Signed)
Subjective: The patient was seen and examined at bedside in the presence of her nurse. She complains of lower abdominal cramping, mildly relieved after bowel movements. She is requesting for a diet to be advanced. Patient reports loose black stools with mucus since admission.  Objective: Vital signs in last 24 hours: Temp:  [98 F (36.7 C)-99.9 F (37.7 C)] 98.7 F (37.1 C) (02/08 0730) Pulse Rate:  [71-93] 84 (02/08 0600) Resp:  [14-27] 16 (02/08 0600) BP: (111-139)/(65-101) 117/71 (02/08 0600) SpO2:  [92 %-100 %] 98 % (02/08 0600) Weight:  [58.5 kg] 58.5 kg (02/08 0600) Weight change: 4.4 kg Last BM Date: 02/06/19  GP:QDIYME noted in the tongue-clean based GENERAL:mild pallor ABDOMEN:soft, mild distention, normoactive bowel sounds, mild tenderness in lower mid abdomen and right as well as left lower quadrant EXTREMITIES:no pedal edema noted  Lab Results: Results for orders placed or performed during the hospital encounter of 02/02/19 (from the past 48 hour(s))  Prepare Pheresed Platelets     Status: None   Collection Time: 02/05/19  8:19 AM  Result Value Ref Range   Unit Number [id number]    Blood Component Type PLTP LR2 PAS    Unit division 00    Status of Unit ISSUED,FINAL    Transfusion Status      OK TO TRANSFUSE Performed at [facility], [address], [place], [place] [address]   Pathologist smear review     Status: None   Collection Time: 02/05/19  9:51 AM  Result Value Ref Range   Path Review      Peripheral blood smears from 02/04/2019 and 02/05/2019 reviewed.  No schistocytes identified.  Pancytopenia.    Comment: Reviewed by Annie Sable. Charm Barges, M.D. 02/05/2019. Performed at [facility], [address] [place], [place] [address]   Expectorated sputum assessment w rflx to resp cult     Status: None   Collection Time: 02/05/19  5:10 PM  Result Value Ref Range   Specimen Description EXPECTORATED SPUTUM    Special Requests NONE    Sputum  evaluation      THIS SPECIMEN IS ACCEPTABLE FOR SPUTUM CULTURE Performed at [facility], [address] [place], [place] [address]    Report Status 02/05/2019 FINAL   Culture, respiratory     Status: None (Preliminary result)   Collection Time: 02/05/19  5:10 PM  Result Value Ref Range   Specimen Description EXPECTORATED SPUTUM    Special Requests NONE Reflexed from [id number]    Gram Stain      ABUNDANT WBC PRESENT,BOTH PMN AND MONONUCLEAR ABUNDANT GRAM POSITIVE COCCI ABUNDANT GRAM NEGATIVE RODS Performed at [facility], [address], [place], [place] [address]    Culture PENDING    Report Status PENDING   Urinalysis, Routine w reflex microscopic     Status: Abnormal   Collection Time: 02/05/19  5:15 PM  Result Value Ref Range   Color, Urine YELLOW YELLOW   APPearance CLEAR CLEAR   Specific Gravity, Urine 1.011 1.005 - 1.030   pH 9.0 (H) 5.0 - 8.0   Glucose, UA NEGATIVE NEGATIVE mg/dL   Hgb urine dipstick LARGE (A) NEGATIVE   Bilirubin Urine NEGATIVE NEGATIVE   Ketones, ur 20 (A) NEGATIVE mg/dL   Protein, ur 30 (A) NEGATIVE mg/dL   Nitrite NEGATIVE NEGATIVE   Leukocytes, UA LARGE (A) NEGATIVE   RBC / HPF >50 (H) 0 - 5 RBC/hpf   WBC, UA 21-50 0 - 5 WBC/hpf  Bacteria, UA MANY (A) NONE SEEN   Squamous Epithelial / LPF 0-5 0 - 5   Triple Phosphate Crystal PRESENT    Non Squamous Epithelial 0-5 (A) NONE SEEN    Comment: Performed at [facility], [address] [place], [place] [address]  Lactate dehydrogenase     Status: Abnormal   Collection Time: 02/06/19  6:50 AM  Result Value Ref Range   LDH 313 (H) 98 - 192 U/L    Comment: Performed at [facility], [address] [place], [place] [address]  CEA     Status: None   Collection Time: 02/06/19  6:50 AM  Result Value Ref Range   CEA 3.7 0.0 - 4.7 ng/mL    Comment: (NOTE)                             Nonsmokers          <3.9                             Smokers             <5.6 Roche  Diagnostics Electrochemiluminescence Immunoassay (ECLIA) Values obtained with different assay methods or kits cannot be used interchangeably.  Results cannot be interpreted as absolute evidence of the presence or absence of malignant disease. Performed At: [facility] [address] [place], [place] [id number] Jolene SchimkeNagendra Sanjai MD MV:[id number]:[phone]   CBC with Differential     Status: Abnormal   Collection Time: 02/06/19  6:50 AM  Result Value Ref Range   WBC 7.7 4.0 - 10.5 K/uL   RBC 3.10 (L) 3.87 - 5.11 MIL/uL   Hemoglobin 9.1 (L) 12.0 - 15.0 g/dL   HCT 84.127.9 (L) 32.436.0 - 40.146.0 %   MCV 90.0 80.0 - 100.0 fL   MCH 29.4 26.0 - 34.0 pg   MCHC 32.6 30.0 - 36.0 g/dL   RDW 02.714.6 25.311.5 - 66.415.5 %   Platelets 86 (L) 150 - 400 K/uL    Comment: REPEATED TO VERIFY Immature Platelet Fraction may be clinically indicated, consider ordering this additional test [id number] CONSISTENT WITH PREVIOUS RESULT    nRBC 0.0 0.0 - 0.2 %   Neutrophils Relative % 85 %   Neutro Abs 6.6 1.7 - 7.7 K/uL   Lymphocytes Relative 9 %   Lymphs Abs 0.7 0.7 - 4.0 K/uL   Monocytes Relative 5 %   Monocytes Absolute 0.4 0.1 - 1.0 K/uL   Eosinophils Relative 0 %   Eosinophils Absolute 0.0 0.0 - 0.5 K/uL   Basophils Relative 0 %   Basophils Absolute 0.0 0.0 - 0.1 K/uL   Immature Granulocytes 1 %   Abs Immature Granulocytes 0.06 0.00 - 0.07 K/uL    Comment: Performed at [facility], [address] [place], [place] [address]  Save Smear     Status: None   Collection Time: 02/06/19  6:50 AM  Result Value Ref Range   Smear Review SMEAR STAINED AND AVAILABLE FOR REVIEW     Comment: Performed at [facility], [address] [place], [place] [address]  CK     Status: None   Collection Time: 02/06/19  6:50 AM  Result Value Ref Range   Total CK 168 38 - 234 U/L    Comment: Performed at [facility], [address] [place], [place] [address]  Comprehensive metabolic panel     Status: Abnormal    Collection Time: 02/06/19  6:50 AM  Result Value Ref Range   Sodium 144 135 - 145 mmol/L   Potassium 3.0 (L) 3.5 - 5.1 mmol/L   Chloride 108 98 - 111 mmol/L   CO2 23 22 - 32 mmol/L   Glucose, Bld 80 70 - 99 mg/dL   BUN 20 6 - 20 mg/dL   Creatinine, Ser 8.650.85 0.44 - 1.00 mg/dL   Calcium 7.6 (L) 8.9 - 10.3 mg/dL   Total Protein 4.8 (L) 6.5 - 8.1 g/dL   Albumin 1.9 (L) 3.5 - 5.0 g/dL   AST 36 15 - 41 U/L   ALT 24 0 - 44 U/L   Alkaline Phosphatase 53 38 - 126 U/L   Total Bilirubin 1.4 (H) 0.3 - 1.2 mg/dL   GFR calc non Af Amer >60 >60 mL/min   GFR calc Af Amer >60 >60 mL/min   Anion gap 13 5 - 15    Comment: Performed at [facility], [address] [place], [place] [address]  Renal function panel     Status: Abnormal   Collection Time: 02/07/19  3:46 AM  Result Value Ref Range   Sodium 137 135 - 145 mmol/L    Comment: DELTA CHECK NOTED   Potassium 3.0 (L) 3.5 - 5.1 mmol/L   Chloride 103 98 - 111 mmol/L   CO2 23 22 - 32 mmol/L   Glucose, Bld 100 (H) 70 - 99 mg/dL   BUN 12 6 - 20 mg/dL   Creatinine, Ser 6.290.56 0.44 - 1.00 mg/dL   Calcium 7.4 (L) 8.9 - 10.3 mg/dL   Phosphorus 2.8 2.5 - 4.6 mg/dL   Albumin 1.9 (L) 3.5 - 5.0 g/dL   GFR calc non Af Amer >60 >60 mL/min   GFR calc Af Amer >60 >60 mL/min   Anion gap 11 5 - 15    Comment: Performed at [facility], [address] [place], [place] [address]  CBC with Differential/Platelet     Status: Abnormal   Collection Time: 02/07/19  3:46 AM  Result Value Ref Range   WBC 10.5 4.0 - 10.5 K/uL   RBC 3.35 (L) 3.87 - 5.11 MIL/uL   Hemoglobin 9.7 (L) 12.0 - 15.0 g/dL   HCT 32.429.6 (L) 40.136.0 - 02.746.0 %   MCV 88.4 80.0 - 100.0 fL   MCH 29.0 26.0 - 34.0 pg   MCHC 32.8 30.0 - 36.0 g/dL   RDW 25.314.3 66.411.5 - 40.315.5 %   Platelets 124 (L) 150 - 400 K/uL    Comment: REPEATED TO VERIFY Immature Platelet Fraction may be clinically indicated, consider ordering this additional test [id number]    nRBC 0.0 0.0 - 0.2 %   Neutrophils Relative  % 85 %   Neutro Abs 9.0 (H) 1.7 - 7.7 K/uL   Lymphocytes Relative 8 %   Lymphs Abs 0.8 0.7 - 4.0 K/uL   Monocytes Relative 5 %   Monocytes Absolute 0.5 0.1 - 1.0 K/uL   Eosinophils Relative 1 %   Eosinophils Absolute 0.1 0.0 - 0.5 K/uL   Basophils Relative 0 %   Basophils Absolute 0.0 0.0 - 0.1 K/uL   Immature Granulocytes 1 %   Abs Immature Granulocytes 0.07 0.00 - 0.07 K/uL    Comment: Performed at [facility], [address] [place], [place] [address]  Magnesium     Status: None   Collection Time:  02/07/19  3:46 AM  Result Value Ref Range   Magnesium 1.7 1.7 - 2.4 mg/dL    Comment: Performed at [facility], [address], [place], [place] [address]    Studies/Results: Ct Soft Tissue Neck W Contrast  Result Date: 02/05/2019 CLINICAL DATA:  58 y/o  F; right eye swelling. EXAM: CT NECK WITH CONTRAST TECHNIQUE: Multidetector CT imaging of the neck was performed using the standard protocol following the bolus administration of intravenous contrast. CONTRAST:  ISOVUE-370 IOPAMIDOL (ISOVUE-370) INJECTION 76% COMPARISON:  02/02/2019 MRV of the head. FINDINGS: Pharynx and larynx: Normal. No mass or swelling. Salivary glands: No inflammation, mass, or stone. Thyroid: Normal. Lymph nodes: None enlarged or abnormal density. Vascular: Negative. Patent left transverse, sigmoid, internal jugular venous system, small in caliber. Absent flow related signal and prior MRV of the head is likely due to relatively lower flow in the left system in comparison with the right. Limited intracranial: Negative. Visualized orbits: No orbital mass or inflammatory process. Edema within the right periorbital compartment. Mastoids and visualized paranasal sinuses: Clear. Skeleton: Mild spondylosis of the cervical spine. No high-grade bony spinal canal stenosis. Upper chest: Mild biapical pleuroparenchymal scarring. Small left pleural effusion. Other: None. IMPRESSION: 1. Right periorbital compartment  edema, possibly infectious or inflammatory. No orbital compartment mass or inflammatory process. No abscess. 2. Small left pleural effusion. 3. Mild spondylosis of the cervical spine. Electronically Signed   By: Mitzi Hansen M.D.   On: 02/05/2019 22:52   Vas [place] Lower Extremity Venous (dvt)  Result Date: 02/05/2019  Lower Venous Study Indications: D dimer.  Performing Technologist: Jeb Levering RDMS, RVT  Examination Guidelines: A complete evaluation includes B-mode imaging, spectral Doppler, color Doppler, and power Doppler as needed of all accessible portions of each vessel. Bilateral testing is considered an integral part of a complete examination. Limited examinations for reoccurring indications may be performed as noted.  Right Venous Findings: +---------+---------------+---------+-----------+----------+-------+          CompressibilityPhasicitySpontaneityPropertiesSummary +---------+---------------+---------+-----------+----------+-------+ CFV      Full           Yes      Yes                          +---------+---------------+---------+-----------+----------+-------+ SFJ      Full                                                 +---------+---------------+---------+-----------+----------+-------+ FV Prox  Full                                                 +---------+---------------+---------+-----------+----------+-------+ FV Mid   Full                                                 +---------+---------------+---------+-----------+----------+-------+ FV DistalFull                                                 +---------+---------------+---------+-----------+----------+-------+  PFV      Full                                                 +---------+---------------+---------+-----------+----------+-------+ POP      Full           Yes      Yes                          +---------+---------------+---------+-----------+----------+-------+  PTV      Full                                                 +---------+---------------+---------+-----------+----------+-------+ PERO     Full                                                 +---------+---------------+---------+-----------+----------+-------+  Left Venous Findings: +---------+---------------+---------+-----------+----------+-------+          CompressibilityPhasicitySpontaneityPropertiesSummary +---------+---------------+---------+-----------+----------+-------+ CFV      Full           Yes      Yes                          +---------+---------------+---------+-----------+----------+-------+ SFJ      Full                                                 +---------+---------------+---------+-----------+----------+-------+ FV Prox  Full                                                 +---------+---------------+---------+-----------+----------+-------+ FV Mid   Full                                                 +---------+---------------+---------+-----------+----------+-------+ FV DistalFull                                                 +---------+---------------+---------+-----------+----------+-------+ PFV      Full                                                 +---------+---------------+---------+-----------+----------+-------+ POP      Full           Yes      Yes                          +---------+---------------+---------+-----------+----------+-------+  PTV      Full                                                 +---------+---------------+---------+-----------+----------+-------+ PERO     Full                                                 +---------+---------------+---------+-----------+----------+-------+    Summary: Right: There is no evidence of deep vein thrombosis in the lower extremity. No cystic structure found in the popliteal fossa. Left: There is no evidence of deep vein thrombosis in the lower  extremity. No cystic structure found in the popliteal fossa.  *See table(s) above for measurements and observations. Electronically signed by Lemar Livings MD on 02/05/2019 at 5:57:33 PM.    Final    Ct Angio Abd/pel W/ And/or W/o  Result Date: 02/05/2019 CLINICAL DATA:  Recent EGD demonstrates ischemic changes of the stomach, esophagus and duodenum. EXAM: CTA ABDOMEN AND PELVIS WITHOUT AND WITH CONTRAST TECHNIQUE: Multidetector CT imaging of the abdomen and pelvis was performed using the standard protocol during bolus administration of intravenous contrast. Multiplanar reconstructed images and MIPs were obtained and reviewed to evaluate the vascular anatomy. CONTRAST:  ISOVUE-370 IOPAMIDOL (ISOVUE-370) INJECTION 76% COMPARISON:  CT abdomen 02/02/2018 FINDINGS: VASCULAR Aorta: Normal caliber aorta without aneurysm, dissection, vasculitis or significant stenosis. Celiac: Patent without evidence of aneurysm, dissection, vasculitis or significant stenosis. SMA: Patent without evidence of aneurysm, dissection, vasculitis or significant stenosis. Renals: Both renal arteries are patent without evidence of aneurysm, dissection, vasculitis, fibromuscular dysplasia or significant stenosis. IMA: Patent without evidence of aneurysm, dissection, vasculitis or significant stenosis. Inflow: Patent without evidence of aneurysm, dissection, vasculitis or significant stenosis. Proximal Outflow: Bilateral common femoral and visualized portions of the superficial and profunda femoral arteries are patent without evidence of aneurysm, dissection, vasculitis or significant stenosis. Veins: No obvious venous abnormality within the limitations of this arterial phase study. Review of the MIP images confirms the above findings. NON-VASCULAR Lower chest: Small left pleural effusion.  Bibasilar atelectasis. Hepatobiliary: No focal liver abnormality is seen. No gallstones, gallbladder wall thickening, or biliary dilatation. Pancreas:  Unremarkable. No pancreatic ductal dilatation or surrounding inflammatory changes. Spleen: Normal in size without focal abnormality. Adrenals/Urinary Tract: Adrenal glands are unremarkable. Bilateral moderate hydroureteronephrosis likely resulting from severe bladder distension. No urolithiasis. Overall abnormal left nephrogram with wedge-shaped areas of low density in the left upper pole of the kidney with small amount of perinephric fluid and hemorrhage. Multiple hypodense, fluid attenuating renal masses bilaterally most consistent with cysts. Stomach/Bowel: Stomach is within normal limits. No evidence of bowel wall thickening, distention, or inflammatory changes. No pneumatosis, pneumoperitoneum or portal venous gas. Lymphatic: Abdominal aortic atherosclerosis. Normal caliber abdominal aorta. No lymphadenopathy. Reproductive: Uterus and bilateral adnexa are unremarkable. Other: No abdominal wall hernia or abnormality. No abdominopelvic ascites. Musculoskeletal: No acute osseous abnormality. No aggressive osseous lesion. Degenerative disc disease with disc height loss L4-5 with bilateral facet arthropathy. IMPRESSION: VASCULAR 1. No arterial or venous thrombosis within the abdomen or pelvis. NON-VASCULAR 1. Overall abnormal left nephrogram with wedge-shaped areas of low density in the left upper pole of the kidney with small amount of perinephric fluid and hemorrhage. Differential considerations include pyelonephritis  from typical etiology or atypical etiologies such as TB versus infiltrating lesions such as lymphoma versus less likely renal infarct. This appearance can also be seen in the setting of trauma, but no apparent history of trauma is noted within the chart. 2. Bilateral moderate hydroureteronephrosis likely resulting from severe bladder distension or vesicoureteral reflux. 3. Small left pleural effusion.  Bibasilar atelectasis. Electronically Signed   By: Elige Ko   On: 02/05/2019 23:27     Medications: I have reviewed the patient's current medications.  Assessment: Severe esophagitis, gastric ulcer and duodenitis noted on EGD from 02/05/2019,biopsies not taken due to thrombocytopenia  Recent CT angiogram shows an unremarkable stomach, no evidence of bowel thickening, no pneumatosis of portal venous gas No evidence of ischemia, except possible renal infarct  Hemoglobin stable at 9.1/9.7 with improvement in platelet to 124,along with improving BUN, black liquid stool likely residual blood from all the ulcers and inflammation noted on EGD  CITROBACTER KOSERIBacteremia  Plan: Patient was to be started on full liquid diet with plans to advance to mechanical soft diet as tolerated Continue IV PPI Will add Carafate suspension 4 times a day Patient receiving Xylocaine Viscous mouth solution every 4 hours as needed Will discontinue MiraLAX Agree with supportive management   Kerin Salen 02/07/2019, 7:54 AM   Pager [phone] If no answer or after 5 PM call [phone]

## 2019-02-07 NOTE — Progress Notes (Signed)
PROGRESS NOTE    Sarah Fry  EPP:[id number] DOB: 03/19/1961 DOA: 02/02/2019 PCP: Merlene Laughter, MD  Outpatient Specialists:    Brief Narrative: Patient is a 58 year old female with past medical history significant for nephrolithiasis.  Patient was admitted with what appears to be generalized edema with associated tongue swollen after a week trip in [place].  Apparently, patient was on acetaminophen and NSAID prior to onset of symptoms.  On presentation, patient was in acute kidney injury.  Proteinuria was noted but nothing the nephrotic range.  Blood culture has grown Citrobacter organism.  Patient has had prior angioedema.  Patient tried EpiPen prior to presentation without any significant improvement.  Significant thrombocytopenia was noted on presentation.  Thrombocytopenia is resolving.  Acute kidney injury has resolved.  Edema is resolving.  Patient has also ulcer of the tongue that is painful.  CT scan revealed thickening of the esophagus, with EGD revealing multiple ulcers, initially thought to be ischemic.  Ischemic work-up was nonrevealing to date.  Patient has been transferred to the hospitalist team for further assessment and management.  02/07/2019: Patient seen.  Patient remains fairly stable.  Thrombocytopenia is resolving.  The platelet count is 124 today.  No fever or chills.  Hypokalemia persists.  We will continue to monitor and replete.  Will swab tongue ulcer for herpes PCR.  We will also check blood for CMV DNA PCR.  We will transfer patient out of the ICU.  Assessment & Plan:   Active Problems:   AKI (acute kidney injury) (HCC)   Abdominal pain, generalized   Abnormal CT scan, esophagus   SVC syndrome   Acute kidney injury with severe thrombocytopenia: Acute kidney injury has resolved. Thrombocytopenia is resolving. Exact etiology is unknown. Nephrology input is highly appreciated. Hematology input is appreciated. Patient has been worked up  extensively. 02/07/2019: Serum creatinine today is 0.56.  Bacteremia/sepsis secondary to Citrobacter: Continue current antibiotics. 02/07/2019: Complete course of antibiotics.  Generalized edema/atypical angioedema:  Etiology is unclear. Work-up is still in process.  Tongue ulcers: Dental/oral surgery is managing. We deferred to dental/oral surgery team. Discussed lidocaine. 02/07/2019: Swab tongue also for herpes PCR.  Also check blood for CMV DNA PCR.  Low threshold to biopsy tongue lesion if persistent.  Severely ulcerated also is involving the esophagus, pyloric channel in the duodenum: GI team is managing. Patient is still on Protonix.  Hypokalemia: Potassium today is 3. K-Dur 40 M EQ p.o. every 4 hours x2 doses. Need to monitor and replete.  Further management depend on hospital course.  DVT prophylaxis: SCD Code Status: Full Family Communication: Son Disposition Plan: Home eventually   Consultants:   GI  Nephrology  Oral surgery  Hematology oncology  Procedures:   EGD  Antimicrobials:   IV cefepime   Subjective: No new complaints Edema is resolving Tongue swelling has resolved.  Ulcer left lateral aspect of tongue process.  Objective: Vitals:   02/07/19 0600 02/07/19 0700 02/07/19 0730 02/07/19 0800  BP: 117/71 127/76  138/75  Pulse: 84 80  78  Resp: 16 18  20   Temp:   98.7 F (37.1 C)   TempSrc:   Oral   SpO2: 98% 98%  98%  Weight: 58.5 kg     Height:        Intake/Output Summary (Last 24 hours) at 02/07/2019 0949 Last data filed at 02/07/2019 0852 Gross per 24 hour  Intake 736.67 ml  Output 1800 ml  Net -1063.33 ml   Filed Weights   02/03/19  1235 02/06/19 0500 02/07/19 0600  Weight: 53 kg 54.1 kg 58.5 kg    Examination:  General exam: Appears calm and comfortable.  Tongue ulcers noted. Respiratory system: Clear to auscultation. Respiratory effort normal. Cardiovascular system: S1 & S2 heard, RRR. No JVD, murmurs, rubs, gallops or  clicks. No pedal edema. Gastrointestinal system: Abdomen is nondistended, soft and nontender. No organomegaly or masses felt. Normal bowel sounds heard. Central nervous system: Alert and oriented. No focal neurological deficits. Extremities: Symmetric 5 x 5 power.  Data Reviewed: I have personally reviewed following labs and imaging studies  CBC: Recent Labs  Lab 02/02/19 1204  02/03/19 1847 02/04/19 0501 02/05/19 0251 02/06/19 0650 02/07/19 0346  WBC 4.0   < > 3.5* 3.7* 7.2 7.7 10.5  NEUTROABS 3.0  --   --  2.9  --  6.6 9.0*  HGB 16.6*   < > 11.3* 11.4* 10.3* 9.1* 9.7*  HCT 48.7*   < > 31.7* 31.7* 30.5* 27.9* 29.6*  MCV 87.6   < > 83.2 84.3 87.6 90.0 88.4  PLT 64*   < > 49* 52* 44* 86* 124*   < > = values in this interval not displayed.   Basic Metabolic Panel: Recent Labs  Lab 02/03/19 0428 02/04/19 1024 02/05/19 0251 02/06/19 0650 02/07/19 0346  NA 131* 140 138 144 137  K 3.5 2.4* 3.3* 3.0* 3.0*  CL 97* 93* 99 108 103  CO2 20* 35* 29 23 23   GLUCOSE 129* 116* 100* 80 100*  BUN 43* 21* 17 20 12   CREATININE 1.40* 0.77 0.68 0.85 0.56  CALCIUM 7.3* 7.3* 7.5* 7.6* 7.4*  MG 2.1  --  2.1  --  1.7  PHOS 2.5  --  2.6  --  2.8   GFR: Estimated Creatinine Clearance: 69.8 mL/min (by C-G formula based on SCr of 0.56 mg/dL). Liver Function Tests: Recent Labs  Lab 02/02/19 1204 02/04/19 1024 02/05/19 0251 02/06/19 0650 02/07/19 0346  AST 48* 51* 55* 36  --   ALT 29 29 31 24   --   ALKPHOS 90 70 63 53  --   BILITOT 0.5 0.5 0.9 1.4*  --   PROT 6.9 4.7* 4.7* 4.8*  --   ALBUMIN 3.4* 1.9* 1.9* 1.9* 1.9*   No results for input(s): LIPASE, AMYLASE in the last 168 hours. No results for input(s): AMMONIA in the last 168 hours. Coagulation Profile: Recent Labs  Lab 02/02/19 1252  INR 1.12   Cardiac Enzymes: Recent Labs  Lab 02/02/19 1507 02/03/19 1517 02/03/19 2313 02/04/19 1024 02/06/19 0650  CKTOTAL 503*  --   --  261* 168  TROPONINI  --  <0.03 <0.03  --   --     BNP (last 3 results) No results for input(s): PROBNP in the last 8760 hours. HbA1C: No results for input(s): HGBA1C in the last 72 hours. CBG: Recent Labs  Lab 02/05/19 0738  GLUCAP 80   Lipid Profile: No results for input(s): CHOL, HDL, LDLCALC, TRIG, CHOLHDL, LDLDIRECT in the last 72 hours. Thyroid Function Tests: No results for input(s): TSH, T4TOTAL, FREET4, T3FREE, THYROIDAB in the last 72 hours. Anemia Panel: No results for input(s): VITAMINB12, FOLATE, FERRITIN, TIBC, IRON, RETICCTPCT in the last 72 hours. Urine analysis:    Component Value Date/Time   COLORURINE YELLOW 02/05/2019 1715   APPEARANCEUR CLEAR 02/05/2019 1715   LABSPEC 1.011 02/05/2019 1715   PHURINE 9.0 (H) 02/05/2019 1715   GLUCOSEU NEGATIVE 02/05/2019 1715   HGBUR LARGE (A) 02/05/2019 1715  BILIRUBINUR NEGATIVE 02/05/2019 1715   KETONESUR 20 (A) 02/05/2019 1715   PROTEINUR 30 (A) 02/05/2019 1715   NITRITE NEGATIVE 02/05/2019 1715   LEUKOCYTESUR LARGE (A) 02/05/2019 1715   Sepsis Labs: @LABRCNTIP (procalcitonin:4,lacticidven:4)  ) Recent Results (from the past 240 hour(s))  Culture, blood (Routine X 2) w Reflex to ID Panel     Status: None (Preliminary result)   Collection Time: 02/02/19  3:07 PM  Result Value Ref Range Status   Specimen Description   Final    BLOOD RIGHT ARM Performed at [facility], [address] [place], [place] [address]    Special Requests   Final    BOTTLES DRAWN AEROBIC ONLY Blood Culture results may not be optimal due to an inadequate volume of blood received in culture bottles Performed at [facility], [address] [place], [place] [address]    Culture  Setup Time   Final    GRAM NEGATIVE RODS AEROBIC BOTTLE ONLY Organism ID to follow CRITICAL RESULT CALLED TO, READ BACK BY AND VERIFIED WITH: Eduardo Osier [facility] [id number] MLM Performed at [facility], [address], [place], [place] [address]    Culture GRAM  NEGATIVE RODS  Final   Report Status PENDING  Incomplete  Culture, blood (Routine X 2) w Reflex to ID Panel     Status: None (Preliminary result)   Collection Time: 02/02/19  3:07 PM  Result Value Ref Range Status   Specimen Description   Final    BLOOD RIGHT ARM Performed at [facility], [address], [place], [place] [address]    Special Requests   Final    BOTTLES DRAWN AEROBIC ONLY Blood Culture results may not be optimal due to an inadequate volume of blood received in culture bottles Performed at [facility], [address], [place], [place] [address]    Culture   Final    NO GROWTH 4 DAYS Performed at [facility], [address] [place], [place] [address]    Report Status PENDING  Incomplete  Blood Culture ID Panel (Reflexed)     Status: Abnormal   Collection Time: 02/02/19  3:07 PM  Result Value Ref Range Status   Enterococcus species NOT DETECTED NOT DETECTED Final   Listeria monocytogenes NOT DETECTED NOT DETECTED Final   Staphylococcus species NOT DETECTED NOT DETECTED Final   Staphylococcus aureus (BCID) NOT DETECTED NOT DETECTED Final   Streptococcus species NOT DETECTED NOT DETECTED Final   Streptococcus agalactiae NOT DETECTED NOT DETECTED Final   Streptococcus pneumoniae NOT DETECTED NOT DETECTED Final   Streptococcus pyogenes NOT DETECTED NOT DETECTED Final   Acinetobacter baumannii NOT DETECTED NOT DETECTED Final   Enterobacteriaceae species DETECTED (A) NOT DETECTED Final    Comment: Enterobacteriaceae represent a large family of gram negative bacteria, not a single organism. Refer to culture for further identification. CRITICAL RESULT CALLED TO, READ BACK BY AND VERIFIED WITH: PHARMD M PHAM [phone] MLM    Enterobacter cloacae complex NOT DETECTED NOT DETECTED Final   Escherichia coli NOT DETECTED NOT DETECTED Final   Klebsiella oxytoca NOT DETECTED NOT DETECTED Final   Klebsiella pneumoniae NOT DETECTED  NOT DETECTED Final   Proteus species NOT DETECTED NOT DETECTED Final   Serratia marcescens NOT DETECTED NOT DETECTED Final   Carbapenem resistance NOT DETECTED NOT DETECTED Final   Haemophilus influenzae NOT DETECTED NOT DETECTED Final   Neisseria meningitidis NOT DETECTED NOT DETECTED Final  Pseudomonas aeruginosa NOT DETECTED NOT DETECTED Final   Candida albicans NOT DETECTED NOT DETECTED Final   Candida glabrata NOT DETECTED NOT DETECTED Final   Candida krusei NOT DETECTED NOT DETECTED Final   Candida parapsilosis NOT DETECTED NOT DETECTED Final   Candida tropicalis NOT DETECTED NOT DETECTED Final    Comment: Performed at [facility], [address] [place], [place] [address]  MRSA PCR Screening     Status: None   Collection Time: 02/02/19  8:30 PM  Result Value Ref Range Status   MRSA by PCR NEGATIVE NEGATIVE Final    Comment:        The GeneXpert MRSA Assay (FDA approved for NASAL specimens only), is one component of a comprehensive MRSA colonization surveillance program. It is not intended to diagnose MRSA infection nor to guide or monitor treatment for MRSA infections. Performed at [facility], [address] [place], [place] [address]   Culture, blood (Routine X 2) w Reflex to ID Panel     Status: Abnormal   Collection Time: 02/03/19  4:12 PM  Result Value Ref Range Status   Specimen Description BLOOD LEFT FOOT  Final   Special Requests   Final    BOTTLES DRAWN AEROBIC AND ANAEROBIC Blood Culture adequate volume   Culture  Setup Time   Final    GRAM NEGATIVE RODS ANAEROBIC BOTTLE ONLY CRITICAL RESULT CALLED TO, READ BACK BY AND VERIFIED WITH: K [facility]END [facility] 2207 02/04/19 A BROWNING Performed at [facility], [address] [place], [place] [address]    Culture CITROBACTER KOSERI (A)  Final   Report Status 02/06/2019 FINAL  Final   Organism ID, Bacteria CITROBACTER KOSERI  Final      Susceptibility   Citrobacter koseri - MIC*    CEFAZOLIN <=4  SENSITIVE Sensitive     CEFEPIME <=1 SENSITIVE Sensitive     CEFTAZIDIME <=1 SENSITIVE Sensitive     CEFTRIAXONE <=1 SENSITIVE Sensitive     CIPROFLOXACIN <=0.25 SENSITIVE Sensitive     GENTAMICIN <=1 SENSITIVE Sensitive     IMIPENEM <=0.25 SENSITIVE Sensitive     TRIMETH/SULFA <=20 SENSITIVE Sensitive     PIP/TAZO <=4 SENSITIVE Sensitive     * CITROBACTER KOSERI  Blood Culture ID Panel (Reflexed)     Status: Abnormal   Collection Time: 02/03/19  4:12 PM  Result Value Ref Range Status   Enterococcus species NOT DETECTED NOT DETECTED Final   Listeria monocytogenes NOT DETECTED NOT DETECTED Final   Staphylococcus species NOT DETECTED NOT DETECTED Final   Staphylococcus aureus (BCID) NOT DETECTED NOT DETECTED Final   Streptococcus species NOT DETECTED NOT DETECTED Final   Streptococcus agalactiae NOT DETECTED NOT DETECTED Final   Streptococcus pneumoniae NOT DETECTED NOT DETECTED Final   Streptococcus pyogenes NOT DETECTED NOT DETECTED Final   Acinetobacter baumannii NOT DETECTED NOT DETECTED Final   Enterobacteriaceae species DETECTED (A) NOT DETECTED Final    Comment: Enterobacteriaceae represent a large family of gram negative bacteria, not a single organism. Refer to culture for further identification. CRITICAL RESULT CALLED TO, READ BACK BY AND VERIFIED WITH: K AMEND PHARMD 2207 02/04/19 A BROWNING    Enterobacter cloacae complex NOT DETECTED NOT DETECTED Final   Escherichia coli NOT DETECTED NOT DETECTED Final   Klebsiella oxytoca NOT DETECTED NOT DETECTED Final   Klebsiella pneumoniae NOT DETECTED NOT DETECTED Final   Proteus species NOT DETECTED NOT DETECTED Final   Serratia marcescens NOT DETECTED NOT DETECTED Final  Carbapenem resistance NOT DETECTED NOT DETECTED Final   Haemophilus influenzae NOT DETECTED NOT DETECTED Final   Neisseria meningitidis NOT DETECTED NOT DETECTED Final   Pseudomonas aeruginosa NOT DETECTED NOT DETECTED Final   Candida albicans NOT DETECTED NOT  DETECTED Final   Candida glabrata NOT DETECTED NOT DETECTED Final   Candida krusei NOT DETECTED NOT DETECTED Final   Candida parapsilosis NOT DETECTED NOT DETECTED Final   Candida tropicalis NOT DETECTED NOT DETECTED Final    Comment: Performed at [facility], [address] [place], [place] [address]  Culture, blood (Routine X 2) w Reflex to ID Panel     Status: None (Preliminary result)   Collection Time: 02/03/19  4:24 PM  Result Value Ref Range Status   Specimen Description BLOOD LEFT ANKLE  Final   Special Requests   Final    BOTTLES DRAWN AEROBIC AND ANAEROBIC Blood Culture results may not be optimal due to an excessive volume of blood received in culture bottles   Culture  Setup Time   Final    AEROBIC BOTTLE ONLY GRAM NEGATIVE RODS CRITICAL VALUE NOTED.  VALUE IS CONSISTENT WITH PREVIOUSLY REPORTED AND CALLED VALUE. Performed at [facility], [address] [place], [place] [address]    Culture GRAM NEGATIVE RODS  Final   Report Status PENDING  Incomplete  Gastrointestinal Panel by PCR , Stool     Status: None   Collection Time: 02/04/19  5:10 PM  Result Value Ref Range Status   Campylobacter species NOT DETECTED NOT DETECTED Final   Plesimonas shigelloides NOT DETECTED NOT DETECTED Final   Salmonella species NOT DETECTED NOT DETECTED Final   Yersinia enterocolitica NOT DETECTED NOT DETECTED Final   Vibrio species NOT DETECTED NOT DETECTED Final   Vibrio cholerae NOT DETECTED NOT DETECTED Final   Enteroaggregative E coli (EAEC) NOT DETECTED NOT DETECTED Final   Enteropathogenic E coli (EPEC) NOT DETECTED NOT DETECTED Final   Enterotoxigenic E coli (ETEC) NOT DETECTED NOT DETECTED Final   Shiga like toxin producing E coli (STEC) NOT DETECTED NOT DETECTED Final   Shigella/Enteroinvasive E coli (EIEC) NOT DETECTED NOT DETECTED Final   Cryptosporidium NOT DETECTED NOT DETECTED Final   Cyclospora cayetanensis NOT DETECTED NOT DETECTED Final   Entamoeba histolytica  NOT DETECTED NOT DETECTED Final   Giardia lamblia NOT DETECTED NOT DETECTED Final   Adenovirus F40/41 NOT DETECTED NOT DETECTED Final   Astrovirus NOT DETECTED NOT DETECTED Final   Norovirus GI/GII NOT DETECTED NOT DETECTED Final   Rotavirus A NOT DETECTED NOT DETECTED Final   Sapovirus (I, II, IV, and V) NOT DETECTED NOT DETECTED Final    Comment: Performed at [facility], [address] [place], [place] [address]  Expectorated sputum assessment w rflx to resp cult     Status: None   Collection Time: 02/05/19  5:10 PM  Result Value Ref Range Status   Specimen Description EXPECTORATED SPUTUM  Final   Special Requests NONE  Final   Sputum evaluation   Final    THIS SPECIMEN IS ACCEPTABLE FOR SPUTUM CULTURE Performed at [facility], [address] [place], [place] [address]    Report Status 02/05/2019 FINAL  Final  Culture, respiratory     Status: None (Preliminary result)   Collection Time: 02/05/19  5:10 PM  Result Value Ref Range Status   Specimen Description EXPECTORATED SPUTUM  Final   Special Requests NONE Reflexed from [id number]  Final   Gram  Stain   Final    ABUNDANT WBC PRESENT,BOTH PMN AND MONONUCLEAR ABUNDANT GRAM POSITIVE COCCI ABUNDANT GRAM NEGATIVE RODS Performed at [facility], [address] [place], [place] [address]    Culture PENDING  Incomplete   Report Status PENDING  Incomplete  Urine Culture     Status: Abnormal   Collection Time: 02/06/19  6:30 AM  Result Value Ref Range Status   Specimen Description URINE, RANDOM  Final   Special Requests   Final    NONE Performed at [facility], [address] [place], [place] [address]    Culture <10,000 COLONIES/mL INSIGNIFICANT GROWTH (A)  Final   Report Status 02/07/2019 FINAL  Final         Radiology Studies: Ct Soft Tissue Neck W Contrast  Result Date: 02/05/2019 CLINICAL DATA:  58 y/o  F; right eye swelling. EXAM: CT NECK WITH CONTRAST TECHNIQUE: Multidetector CT imaging  of the neck was performed using the standard protocol following the bolus administration of intravenous contrast. CONTRAST:  100mL ISOVUE-370 IOPAMIDOL (ISOVUE-370) INJECTION 76% COMPARISON:  02/02/2019 MRV of the head. FINDINGS: Pharynx and larynx: Normal. No mass or swelling. Salivary glands: No inflammation, mass, or stone. Thyroid: Normal. Lymph nodes: None enlarged or abnormal density. Vascular: Negative. Patent left transverse, sigmoid, internal jugular venous system, small in caliber. Absent flow related signal and prior MRV of the head is likely due to relatively lower flow in the left system in comparison with the right. Limited intracranial: Negative. Visualized orbits: No orbital mass or inflammatory process. Edema within the right periorbital compartment. Mastoids and visualized paranasal sinuses: Clear. Skeleton: Mild spondylosis of the cervical spine. No high-grade bony spinal canal stenosis. Upper chest: Mild biapical pleuroparenchymal scarring. Small left pleural effusion. Other: None. IMPRESSION: 1. Right periorbital compartment edema, possibly infectious or inflammatory. No orbital compartment mass or inflammatory process. No abscess. 2. Small left pleural effusion. 3. Mild spondylosis of the cervical spine. Electronically Signed   By: Mitzi HansenLance  Furusawa-Stratton M.D.   On: 02/05/2019 22:52   Vas [place] Lower Extremity Venous (dvt)  Result Date: 02/05/2019  Lower Venous Study Indications: D dimer.  Performing Technologist: Jeb LeveringJill Parker RDMS, RVT  Examination Guidelines: A complete evaluation includes B-mode imaging, spectral Doppler, color Doppler, and power Doppler as needed of all accessible portions of each vessel. Bilateral testing is considered an integral part of a complete examination. Limited examinations for reoccurring indications may be performed as noted.  Right Venous Findings: +---------+---------------+---------+-----------+----------+-------+           CompressibilityPhasicitySpontaneityPropertiesSummary +---------+---------------+---------+-----------+----------+-------+ CFV      Full           Yes      Yes                          +---------+---------------+---------+-----------+----------+-------+ SFJ      Full                                                 +---------+---------------+---------+-----------+----------+-------+ FV Prox  Full                                                 +---------+---------------+---------+-----------+----------+-------+ FV Mid  Full                                                 +---------+---------------+---------+-----------+----------+-------+ FV DistalFull                                                 +---------+---------------+---------+-----------+----------+-------+ PFV      Full                                                 +---------+---------------+---------+-----------+----------+-------+ POP      Full           Yes      Yes                          +---------+---------------+---------+-----------+----------+-------+ PTV      Full                                                 +---------+---------------+---------+-----------+----------+-------+ PERO     Full                                                 +---------+---------------+---------+-----------+----------+-------+  Left Venous Findings: +---------+---------------+---------+-----------+----------+-------+          CompressibilityPhasicitySpontaneityPropertiesSummary +---------+---------------+---------+-----------+----------+-------+ CFV      Full           Yes      Yes                          +---------+---------------+---------+-----------+----------+-------+ SFJ      Full                                                 +---------+---------------+---------+-----------+----------+-------+ FV Prox  Full                                                  +---------+---------------+---------+-----------+----------+-------+ FV Mid   Full                                                 +---------+---------------+---------+-----------+----------+-------+ FV DistalFull                                                 +---------+---------------+---------+-----------+----------+-------+ PFV  Full                                                 +---------+---------------+---------+-----------+----------+-------+ POP      Full           Yes      Yes                          +---------+---------------+---------+-----------+----------+-------+ PTV      Full                                                 +---------+---------------+---------+-----------+----------+-------+ PERO     Full                                                 +---------+---------------+---------+-----------+----------+-------+    Summary: Right: There is no evidence of deep vein thrombosis in the lower extremity. No cystic structure found in the popliteal fossa. Left: There is no evidence of deep vein thrombosis in the lower extremity. No cystic structure found in the popliteal fossa.  *See table(s) above for measurements and observations. Electronically signed by Lemar Livings MD on 02/05/2019 at 5:57:33 PM.    Final    Ct Angio Abd/pel W/ And/or W/o  Result Date: 02/05/2019 CLINICAL DATA:  Recent EGD demonstrates ischemic changes of the stomach, esophagus and duodenum. EXAM: CTA ABDOMEN AND PELVIS WITHOUT AND WITH CONTRAST TECHNIQUE: Multidetector CT imaging of the abdomen and pelvis was performed using the standard protocol during bolus administration of intravenous contrast. Multiplanar reconstructed images and MIPs were obtained and reviewed to evaluate the vascular anatomy. CONTRAST:  ISOVUE-370 IOPAMIDOL (ISOVUE-370) INJECTION 76% COMPARISON:  CT abdomen 02/02/2018 FINDINGS: VASCULAR Aorta: Normal caliber aorta without aneurysm, dissection,  vasculitis or significant stenosis. Celiac: Patent without evidence of aneurysm, dissection, vasculitis or significant stenosis. SMA: Patent without evidence of aneurysm, dissection, vasculitis or significant stenosis. Renals: Both renal arteries are patent without evidence of aneurysm, dissection, vasculitis, fibromuscular dysplasia or significant stenosis. IMA: Patent without evidence of aneurysm, dissection, vasculitis or significant stenosis. Inflow: Patent without evidence of aneurysm, dissection, vasculitis or significant stenosis. Proximal Outflow: Bilateral common femoral and visualized portions of the superficial and profunda femoral arteries are patent without evidence of aneurysm, dissection, vasculitis or significant stenosis. Veins: No obvious venous abnormality within the limitations of this arterial phase study. Review of the MIP images confirms the above findings. NON-VASCULAR Lower chest: Small left pleural effusion.  Bibasilar atelectasis. Hepatobiliary: No focal liver abnormality is seen. No gallstones, gallbladder wall thickening, or biliary dilatation. Pancreas: Unremarkable. No pancreatic ductal dilatation or surrounding inflammatory changes. Spleen: Normal in size without focal abnormality. Adrenals/Urinary Tract: Adrenal glands are unremarkable. Bilateral moderate hydroureteronephrosis likely resulting from severe bladder distension. No urolithiasis. Overall abnormal left nephrogram with wedge-shaped areas of low density in the left upper pole of the kidney with small amount of perinephric fluid and hemorrhage. Multiple hypodense, fluid attenuating renal masses bilaterally most consistent with cysts. Stomach/Bowel: Stomach is within normal limits. No evidence of bowel wall thickening, distention, or inflammatory changes. No  pneumatosis, pneumoperitoneum or portal venous gas. Lymphatic: Abdominal aortic atherosclerosis. Normal caliber abdominal aorta. No lymphadenopathy. Reproductive: Uterus  and bilateral adnexa are unremarkable. Other: No abdominal wall hernia or abnormality. No abdominopelvic ascites. Musculoskeletal: No acute osseous abnormality. No aggressive osseous lesion. Degenerative disc disease with disc height loss L4-5 with bilateral facet arthropathy. IMPRESSION: VASCULAR 1. No arterial or venous thrombosis within the abdomen or pelvis. NON-VASCULAR 1. Overall abnormal left nephrogram with wedge-shaped areas of low density in the left upper pole of the kidney with small amount of perinephric fluid and hemorrhage. Differential considerations include pyelonephritis from typical etiology or atypical etiologies such as TB versus infiltrating lesions such as lymphoma versus less likely renal infarct. This appearance can also be seen in the setting of trauma, but no apparent history of trauma is noted within the chart. 2. Bilateral moderate hydroureteronephrosis likely resulting from severe bladder distension or vesicoureteral reflux. 3. Small left pleural effusion.  Bibasilar atelectasis. Electronically Signed   By: Elige Ko   On: 02/05/2019 23:27        Scheduled Meds: . sodium chloride   Intravenous Once  . chlorhexidine  15 mL Mouth Rinse BID  . mouth rinse  15 mL Mouth Rinse q12n4p  . nicotine  14 mg Transdermal Daily  . [START ON 02/09/2019] pantoprazole  40 mg Intravenous Q12H  . potassium chloride  40 mEq Oral BID  . potassium chloride  40 mEq Oral Q4H  . sucralfate  1 g Oral TID WC & HS   Continuous Infusions: . sodium chloride 250 mL (02/07/19 0600)  . ceFEPime (MAXIPIME) IV 2 g (02/07/19 0600)  . magnesium sulfate 1 - 4 g bolus IVPB    . pantoprozole (PROTONIX) infusion 8 mg/hr (02/07/19 0600)     LOS: 5 days    Time spent: 75 Minutes    Berton Mount, MD  [facility] Pager #: [phone] 7PM-7AM contact night coverage as above

## 2019-02-07 NOTE — Progress Notes (Signed)
Transferred from 49M via wheelchair. Alert,oriented x4

## 2019-02-08 DIAGNOSIS — T783XXD Angioneurotic edema, subsequent encounter: Secondary | ICD-10-CM

## 2019-02-08 DIAGNOSIS — R933 Abnormal findings on diagnostic imaging of other parts of digestive tract: Secondary | ICD-10-CM

## 2019-02-08 DIAGNOSIS — R1084 Generalized abdominal pain: Secondary | ICD-10-CM

## 2019-02-08 DIAGNOSIS — R809 Proteinuria, unspecified: Secondary | ICD-10-CM

## 2019-02-08 DIAGNOSIS — K259 Gastric ulcer, unspecified as acute or chronic, without hemorrhage or perforation: Secondary | ICD-10-CM

## 2019-02-08 LAB — CULTURE, RESPIRATORY W GRAM STAIN: Culture: NORMAL

## 2019-02-08 LAB — CBC WITH DIFFERENTIAL/PLATELET
Abs Immature Granulocytes: 0.11 10*3/uL — ABNORMAL HIGH (ref 0.00–0.07)
Basophils Absolute: 0 10*3/uL (ref 0.0–0.1)
Basophils Relative: 0 %
Eosinophils Absolute: 0.1 10*3/uL (ref 0.0–0.5)
Eosinophils Relative: 1 %
HCT: 29.7 % — ABNORMAL LOW (ref 36.0–46.0)
Hemoglobin: 9.8 g/dL — ABNORMAL LOW (ref 12.0–15.0)
Immature Granulocytes: 1 %
Lymphocytes Relative: 8 %
Lymphs Abs: 1.1 10*3/uL (ref 0.7–4.0)
MCH: 29.5 pg (ref 26.0–34.0)
MCHC: 33 g/dL (ref 30.0–36.0)
MCV: 89.5 fL (ref 80.0–100.0)
Monocytes Absolute: 0.5 10*3/uL (ref 0.1–1.0)
Monocytes Relative: 4 %
Neutro Abs: 10.9 10*3/uL — ABNORMAL HIGH (ref 1.7–7.7)
Neutrophils Relative %: 86 %
Platelets: 159 10*3/uL (ref 150–400)
RBC: 3.32 MIL/uL — ABNORMAL LOW (ref 3.87–5.11)
RDW: 14.2 % (ref 11.5–15.5)
WBC: 12.8 10*3/uL — ABNORMAL HIGH (ref 4.0–10.5)
nRBC: 0 % (ref 0.0–0.2)

## 2019-02-08 LAB — CULTURE, BLOOD (ROUTINE X 2)

## 2019-02-08 LAB — RENAL FUNCTION PANEL
Albumin: 2.1 g/dL — ABNORMAL LOW (ref 3.5–5.0)
Anion gap: 13 (ref 5–15)
BUN: 8 mg/dL (ref 6–20)
CO2: 21 mmol/L — ABNORMAL LOW (ref 22–32)
Calcium: 7.8 mg/dL — ABNORMAL LOW (ref 8.9–10.3)
Chloride: 105 mmol/L (ref 98–111)
Creatinine, Ser: 0.49 mg/dL (ref 0.44–1.00)
GFR calc Af Amer: >60 mL/min (ref >60)
GFR calc non Af Amer: >60 mL/min (ref >60)
Glucose, Bld: 87 mg/dL (ref 70–99)
Phosphorus: 1.6 mg/dL — ABNORMAL LOW (ref 2.5–4.6)
Potassium: 3.7 mmol/L (ref 3.5–5.1)
Sodium: 139 mmol/L (ref 135–145)

## 2019-02-08 LAB — MAGNESIUM: Magnesium: 1.5 mg/dL — ABNORMAL LOW (ref 1.7–2.4)

## 2019-02-08 MED ORDER — LIDOCAINE VISCOUS HCL 2 % MT SOLN: 15.0000 mL | OROMUCOSAL | 0 refills | Status: DC | PRN

## 2019-02-08 MED ORDER — OXYCODONE-ACETAMINOPHEN 7.5-325 MG PO TABS: 1.0000 | ORAL_TABLET | ORAL | 0 refills | Status: DC | PRN

## 2019-02-08 MED ORDER — PANTOPRAZOLE SODIUM 40 MG PO TBEC: 40.0000 mg | DELAYED_RELEASE_TABLET | Freq: Two times a day (BID) | ORAL | 1 refills | Status: DC

## 2019-02-08 MED ORDER — SUCRALFATE 1 GM/10ML PO SUSP: 1.0000 g | Freq: Three times a day (TID) | ORAL | 0 refills | Status: DC

## 2019-02-08 MED ORDER — NICOTINE 14 MG/24HR TD PT24: 14.0000 mg | MEDICATED_PATCH | Freq: Every day | TRANSDERMAL | 0 refills | Status: DC

## 2019-02-08 NOTE — Plan of Care (Signed)
  Problem: Clinical Measurements: Goal: Respiratory complications will improve Outcome: Progressing   Problem: Activity: Goal: Risk for activity intolerance will decrease Outcome: Progressing   Problem: Nutrition: Goal: Adequate nutrition will be maintained Outcome: Progressing   

## 2019-02-08 NOTE — Progress Notes (Signed)
Subjective: The patient was seen and examined at bedside. Tolerating full liquids and requesting the diet to be advanced. Complains of intermittent back and mid abdominal pain. Has had 3 bowel movements yesterday, denies black stools or bloody bowel movement.  Objective: Vital signs in last 24 hours: Temp:  [98.4 F (36.9 C)-99.1 F (37.3 C)] 99.1 F (37.3 C) (02/09 0547) Pulse Rate:  [76-92] 88 (02/09 0547) Resp:  [16-19] 16 (02/08 2219) BP: (102-147)/(68-76) 147/76 (02/09 0547) SpO2:  [93 %-97 %] 97 % (02/09 0547) Weight change:  Last BM Date: 02/08/19  FU:[id number] comfortable GENERAL:mild pallor, no icterus ABDOMEN:soft, nontender, nondistended, normoactive bowel sounds EXTREMITIES:no deformity, no edema  Lab Results: Results for orders placed or performed during the hospital encounter of 02/02/19 (from the past 48 hour(s))  Renal function panel     Status: Abnormal   Collection Time: 02/07/19  3:46 AM  Result Value Ref Range   Sodium 137 135 - 145 mmol/L    Comment: DELTA CHECK NOTED   Potassium 3.0 (L) 3.5 - 5.1 mmol/L   Chloride 103 98 - 111 mmol/L   CO2 23 22 - 32 mmol/L   Glucose, Bld 100 (H) 70 - 99 mg/dL   BUN 12 6 - 20 mg/dL   Creatinine, Ser 3.22 0.44 - 1.00 mg/dL   Calcium 7.4 (L) 8.9 - 10.3 mg/dL   Phosphorus 2.8 2.5 - 4.6 mg/dL   Albumin 1.9 (L) 3.5 - 5.0 g/dL   GFR calc non Af Amer >60 >60 mL/min   GFR calc Af Amer >60 >60 mL/min   Anion gap 11 5 - 15    Comment: Performed at [facility], [address], [place], [place] [address]  CBC with Differential/Platelet     Status: Abnormal   Collection Time: 02/07/19  3:46 AM  Result Value Ref Range   WBC 10.5 4.0 - 10.5 K/uL   RBC 3.35 (L) 3.87 - 5.11 MIL/uL   Hemoglobin 9.7 (L) 12.0 - 15.0 g/dL   HCT 70.6 (L) 23.7 - 62.8 %   MCV 88.4 80.0 - 100.0 fL   MCH 29.0 26.0 - 34.0 pg   MCHC 32.8 30.0 - 36.0 g/dL   RDW 31.5 17.6 - 16.0 %   Platelets 124 (L) 150 - 400 K/uL    Comment: REPEATED TO  VERIFY Immature Platelet Fraction may be clinically indicated, consider ordering this additional test [id number]    nRBC 0.0 0.0 - 0.2 %   Neutrophils Relative % 85 %   Neutro Abs 9.0 (H) 1.7 - 7.7 K/uL   Lymphocytes Relative 8 %   Lymphs Abs 0.8 0.7 - 4.0 K/uL   Monocytes Relative 5 %   Monocytes Absolute 0.5 0.1 - 1.0 K/uL   Eosinophils Relative 1 %   Eosinophils Absolute 0.1 0.0 - 0.5 K/uL   Basophils Relative 0 %   Basophils Absolute 0.0 0.0 - 0.1 K/uL   Immature Granulocytes 1 %   Abs Immature Granulocytes 0.07 0.00 - 0.07 K/uL    Comment: Performed at [facility], [address] [place], [place] [address]  Magnesium     Status: None   Collection Time: 02/07/19  3:46 AM  Result Value Ref Range   Magnesium 1.7 1.7 - 2.4 mg/dL    Comment: Performed at [facility], [address] [place], [place] [address]  Renal function panel     Status: Abnormal   Collection Time: 02/08/19  4:43 AM  Result Value  Ref Range   Sodium 139 135 - 145 mmol/L   Potassium 3.7 3.5 - 5.1 mmol/L    Comment: DELTA CHECK NOTED   Chloride 105 98 - 111 mmol/L   CO2 21 (L) 22 - 32 mmol/L   Glucose, Bld 87 70 - 99 mg/dL   BUN 8 6 - 20 mg/dL   Creatinine, Ser 1.610.49 0.44 - 1.00 mg/dL   Calcium 7.8 (L) 8.9 - 10.3 mg/dL   Phosphorus 1.6 (L) 2.5 - 4.6 mg/dL   Albumin 2.1 (L) 3.5 - 5.0 g/dL   GFR calc non Af Amer >60 >60 mL/min   GFR calc Af Amer >60 >60 mL/min   Anion gap 13 5 - 15    Comment: Performed at [facility], [address] [place], [place] [address]  CBC with Differential/Platelet     Status: Abnormal   Collection Time: 02/08/19  4:43 AM  Result Value Ref Range   WBC 12.8 (H) 4.0 - 10.5 K/uL   RBC 3.32 (L) 3.87 - 5.11 MIL/uL   Hemoglobin 9.8 (L) 12.0 - 15.0 g/dL   HCT 54.029.7 (L) 98.136.0 - 19.146.0 %   MCV 89.5 80.0 - 100.0 fL   MCH 29.5 26.0 - 34.0 pg   MCHC 33.0 30.0 - 36.0 g/dL   RDW 47.814.2 29.511.5 - 62.115.5 %   Platelets 159 150 - 400 K/uL   nRBC 0.0 0.0 - 0.2 %   Neutrophils  Relative % 86 %   Neutro Abs 10.9 (H) 1.7 - 7.7 K/uL   Lymphocytes Relative 8 %   Lymphs Abs 1.1 0.7 - 4.0 K/uL   Monocytes Relative 4 %   Monocytes Absolute 0.5 0.1 - 1.0 K/uL   Eosinophils Relative 1 %   Eosinophils Absolute 0.1 0.0 - 0.5 K/uL   Basophils Relative 0 %   Basophils Absolute 0.0 0.0 - 0.1 K/uL   Immature Granulocytes 1 %   Abs Immature Granulocytes 0.11 (H) 0.00 - 0.07 K/uL    Comment: Performed at [facility], [address] [place], [place] [address]  Magnesium     Status: Abnormal   Collection Time: 02/08/19  4:43 AM  Result Value Ref Range   Magnesium 1.5 (L) 1.7 - 2.4 mg/dL    Comment: Performed at [facility], [address] [place], [place] [address]    Studies/Results: No results found.  Medications: I have reviewed the patient's current medications.  Assessment: Severe esophagitis, gastric ulcer and duodenitis Ulcer noted in tongue CITROBACTER KOSERI Bacteremia   Plan: Advance diet to mechanical soft Patient to continue Protonix drip for today and start PPI twice a day tomorrow onwards, recommended to continue PPI twice a day at least for 6 weeks. Continue sucralfate suspension, recommend to continue sucralfate twice a day on discharge for at least 2 weeks.  GI will sign off, please recall as needed.   Kerin Salenrya Shannel Zahm 02/08/2019, 8:18 AM   Pager [phone] If no answer or after 5 PM call [phone]

## 2019-02-08 NOTE — Discharge Summary (Signed)
Physician Discharge Summary  Patient ID: TONICIA RAMMER MRN: [id number] DOB/AGE: 06-Apr-1961 58 y.o.  Admit date: 02/02/2019 Discharge date: 02/08/2019  Admission Diagnoses:  Discharge Diagnoses:  Active Problems:   AKI (acute kidney injury) (HCC)   Abdominal pain, generalized   Abnormal CT scan, esophagus   SVC syndrome   Discharged Condition: stable  Hospital Course:  Patient is a 58 year old female, with past medical history significant for nephrolithiasis.  Patient presented with generalized edema, with associated swollen tongue after a week trip to [place].  Apparently, patient was on acetaminophen and NSAID following tooth problem prior to onset of symptoms.  On presentation to the hospital, patient was in acute kidney injury, and was noted to have sub-nephrotic range proteinuria.  Blood culture grew Citrobacter Koseri.  Urine culture grew gram-negative rods, but less than 10,000 CFU per mL (no further identification and sensitivity was carried out).  Patient has had angioedema in the past that may have been related to Flexeril.  Prior to presentation to the hospital, patient tried EpiPen without any significant improvement.  Other significant finding on presentation was thrombocytopenia.  Patient was initially admitted to the ICU team.  Patient was managed for Citrobacter UTI/sepsis.  There were concerns for possible TTP versus HUS, but work-up was not supportive.  DIC was also ruled out.  Hematology directed care.  Nephrology team was consulted for acute kidney injury, in the setting of thrombocytopenia and sub-nephrotic range proteinuria.  Acute kidney injury has resolved.  Patient will need to follow-up with nephrology team, hematology and GI team on discharge.  Citrobacter sepsis was treated with IV cefepime.  Antibiotics was eventually transitioned to Rocephin just prior to discharge.  GI team was consulted due to CT scan finding of thickening of the esophagus.  Patient underwent EGD  that revealed severely ulcerated esophagus, pyloric channel, duodenal bulb and second portion of the duodenum said to be concerning for an ischemic process.  CT Angio of the abdomen and pelvis was recommended, as well as vascular surgery consult.  CT Angie of the abdomen and pelvis revealed no arterial or venous thrombosis within the abdomen or pelvis.  Nonvascular component of the CT abdomen and pelvis can revealed abnormal left nephrogram with wedge-shaped areas of nodular density in the left upper pole of the kidney with small amount of perinephric fluid and hemorrhage, bilateral moderate hydroureteronephrosis likely resulting from severe bladder distention of vesicoureteral reflux and small left pleural effusion with bibasilar atelectasis.  Patient was started on Protonix drip.  Patient will be discharged on oral Protonix.  Due to low platelet level, biopsy of the ulcers were not done.  Patient developed tongue ulcers during the hospital stay, and was managed with viscous lidocaine.  Oral surgery team was consulted for the tongue ulcer, and they directed care.  Tongue also has improved significantly.  Patient was noted to be hypokalemic during the hospital stay.  Potassium was monitored and repleted during the hospital stay.  Hypokalemia has resolved.  Patient will need to follow-up with the GI team on discharge.  Patient's abdominal pain has resolved.  Acute kidney has resolved.  Thrombocytopenia has resolved.  Edema has resolved.  Patient will be discharged back on oral antibiotics.  Acute kidney injury with severe thrombocytopenia: Acute kidney injury has resolved. Thrombocytopenia has resolved.   Exact etiology is uncertain, and most likely multifactorial.   Continue to monitor renal function on discharge.  UTI/bacteremia/sepsis secondary to Citrobacter: Blood culture grew Citrobacter Koseri Urine culture grew gram-negative rods, less  than 10,000 CFU per milliliter. Patient was treated with IV  cefepime during the hospital stay, and transition to Rocephin. Patient be discharged on oral antibiotics.  Generalized edema/atypical angioedema:  Etiology is unclear.  Tongue ulcers: Dental/oral surgery directed care.   Patient was managed supportively with viscous lidocaine.   Tongue also has improved significantly.  Severely ulcer involving the esophagus, pyloric channel in the duodenum: Patient was managed with IV Protonix. Patient will be discharged back on oral Protonix.   EGD was done during the hospital stay.  Biopsy was not done due to low platelets.    Hypokalemia: Monitored and repleted during the hospital stay. Continue to monitor on discharge.  Consults: GI, nephrology, vascular surgery and hematology/oncology  Significant Diagnostic Studies:    Discharge Exam: Blood pressure (!) 147/76, pulse 88, temperature 99.1 F (37.3 C), temperature source Oral, resp. rate 16, height 5\' 5"  (1.651 m), weight 58.5 kg, SpO2 97 %.  Disposition: Discharge disposition: 01-Home or Self Care   Discharge Instructions    Diet - low sodium heart healthy   Complete by:  As directed    Increase activity slowly   Complete by:  As directed      Allergies as of 02/08/2019      Reactions   Flexeril [cyclobenzaprine] Anaphylaxis      Medication List    STOP taking these medications   acetaminophen 325 MG tablet Commonly known as:  TYLENOL   ibuprofen 200 MG tablet Commonly known as:  ADVIL,MOTRIN     TAKE these medications   lidocaine 2 % solution Commonly known as:  XYLOCAINE Use as directed 15 mLs in the mouth or throat every 4 (four) hours as needed for up to 7 days for mouth pain.   nicotine 14 mg/24hr patch Commonly known as:  NICODERM CQ - dosed in mg/24 hours Place 1 patch (14 mg total) onto the skin daily. Start taking on:  February 09, 2019   pantoprazole 40 MG tablet Commonly known as:  PROTONIX Take 1 tablet (40 mg total) by mouth 2 (two) times daily.    sucralfate 1 GM/10ML suspension Commonly known as:  CARAFATE Take 10 mLs (1 g total) by mouth 4 (four) times daily -  with meals and at bedtime.        SignedBarnetta Chapel 02/08/2019, 3:04 PM

## 2019-02-08 NOTE — Discharge Planning (Signed)
Patient discharged home in stable condition. Verbalizes understanding of all discharge instructions, including home medications and follow up appointments. 

## 2019-02-08 NOTE — Progress Notes (Signed)
OT Cancellation Note  Patient Details Name: Sarah Fry MRN: [id number] DOB: [birth year]   Cancelled Treatment:    Reason Eval/Treat Not Completed: OT screened, no needs identified, will sign off.  Pt reports she has been doing ADLs mod I and has been ambulating independently.  No OT needs identified at this time.  Jeani Hawking, OTR/L Acute Rehabilitation Services Pager [phone] Office [phone]   Jeani Hawking M 02/08/2019, 11:34 AM

## 2019-02-09 LAB — PATHOLOGIST SMEAR REVIEW

## 2019-02-10 LAB — CMV DNA, QUANTITATIVE, PCR
CMV DNA Quant: NEGATIVE IU/mL
Log10 CMV Qn DNA Pl: UNDETERMINED log10 IU/mL

## 2019-02-10 LAB — HSV DNA BY PCR (REFERENCE LAB)
HSV 1 DNA: NEGATIVE
HSV 2 DNA: NEGATIVE

## 2019-02-11 ENCOUNTER — Ambulatory Visit (INDEPENDENT_AMBULATORY_CARE_PROVIDER_SITE_OTHER): Payer: 59

## 2019-02-11 ENCOUNTER — Encounter: Payer: Self-pay | Admitting: Family Medicine

## 2019-02-11 ENCOUNTER — Telehealth: Payer: Self-pay | Admitting: Oncology

## 2019-02-11 ENCOUNTER — Ambulatory Visit (INDEPENDENT_AMBULATORY_CARE_PROVIDER_SITE_OTHER): Payer: 59 | Admitting: Family Medicine

## 2019-02-11 VITALS — BP 110/62 | HR 97 | Temp 98.6°F | Ht 65.0 in | Wt 120.8 lb

## 2019-02-11 DIAGNOSIS — R945 Abnormal results of liver function studies: Secondary | ICD-10-CM

## 2019-02-11 DIAGNOSIS — Z87442 Personal history of urinary calculi: Secondary | ICD-10-CM

## 2019-02-11 DIAGNOSIS — Z8619 Personal history of other infectious and parasitic diseases: Secondary | ICD-10-CM

## 2019-02-11 DIAGNOSIS — R911 Solitary pulmonary nodule: Secondary | ICD-10-CM

## 2019-02-11 DIAGNOSIS — Q6102 Congenital multiple renal cysts: Secondary | ICD-10-CM

## 2019-02-11 DIAGNOSIS — Z9189 Other specified personal risk factors, not elsewhere classified: Secondary | ICD-10-CM | POA: Diagnosis not present

## 2019-02-11 DIAGNOSIS — M51369 Other intervertebral disc degeneration, lumbar region without mention of lumbar back pain or lower extremity pain: Secondary | ICD-10-CM

## 2019-02-11 DIAGNOSIS — E876 Hypokalemia: Secondary | ICD-10-CM

## 2019-02-11 DIAGNOSIS — D649 Anemia, unspecified: Secondary | ICD-10-CM

## 2019-02-11 DIAGNOSIS — K2210 Ulcer of esophagus without bleeding: Secondary | ICD-10-CM

## 2019-02-11 DIAGNOSIS — M5136 Other intervertebral disc degeneration, lumbar region: Secondary | ICD-10-CM

## 2019-02-11 DIAGNOSIS — Z09 Encounter for follow-up examination after completed treatment for conditions other than malignant neoplasm: Secondary | ICD-10-CM

## 2019-02-11 DIAGNOSIS — K140 Glossitis: Secondary | ICD-10-CM

## 2019-02-11 DIAGNOSIS — Z87892 Personal history of anaphylaxis: Secondary | ICD-10-CM

## 2019-02-11 DIAGNOSIS — J9811 Atelectasis: Secondary | ICD-10-CM | POA: Diagnosis not present

## 2019-02-11 DIAGNOSIS — J90 Pleural effusion, not elsewhere classified: Secondary | ICD-10-CM | POA: Diagnosis not present

## 2019-02-11 DIAGNOSIS — R7989 Other specified abnormal findings of blood chemistry: Secondary | ICD-10-CM

## 2019-02-11 DIAGNOSIS — R05 Cough: Secondary | ICD-10-CM

## 2019-02-11 DIAGNOSIS — Z87891 Personal history of nicotine dependence: Secondary | ICD-10-CM

## 2019-02-11 DIAGNOSIS — K449 Diaphragmatic hernia without obstruction or gangrene: Secondary | ICD-10-CM

## 2019-02-11 DIAGNOSIS — N1330 Unspecified hydronephrosis: Secondary | ICD-10-CM

## 2019-02-11 DIAGNOSIS — Z1159 Encounter for screening for other viral diseases: Secondary | ICD-10-CM

## 2019-02-11 DIAGNOSIS — R059 Cough, unspecified: Secondary | ICD-10-CM

## 2019-02-11 DIAGNOSIS — N200 Calculus of kidney: Secondary | ICD-10-CM

## 2019-02-11 DIAGNOSIS — I359 Nonrheumatic aortic valve disorder, unspecified: Secondary | ICD-10-CM

## 2019-02-11 DIAGNOSIS — I700 Atherosclerosis of aorta: Secondary | ICD-10-CM

## 2019-02-11 MED ORDER — TRIAMCINOLONE ACETONIDE 0.1 % MT PSTE: 1.0000 "application " | PASTE | Freq: Two times a day (BID) | OROMUCOSAL | 12 refills | Status: DC

## 2019-02-11 NOTE — Telephone Encounter (Signed)
Scheduled appt per 2/11 sch message - pt is aware of appt date and time   

## 2019-02-11 NOTE — Progress Notes (Signed)
Sarah Fry is a 58 y.o. female is here to [facility].   Patient Care Team: Helane Rima, DO as PCP - General (Family Medicine) Magrinat, Valentino Hue, MD as Consulting Physician (Oncology) Bufford Buttner, MD as Consulting Physician (Nephrology) Kerin Salen, MD as Consulting Physician (Gastroenterology)   History of Present Illness:   HPI: New patient. Very pleasant woman that presents with one of her twin sons. Recent hospitalization, with ICU stay x 6 days. Long time smoker with history of kidney stones and recent vacation to [place], presented to [facility] with angioedema/general edema (see pictures in H&P), AKI with sub-nephrotic range proteinuria, thrombocytopenia, and abdominal pain. Found to be critically ill with multiple systems failure, transferred to ICU at [facility].   Unfortunately,  I am not able to see any specialist notes in Epic, only her admit and discharge summaries.  She was seen by Nephrology, Hematology, Vascular, GI, and Pulmonology. She only has follow up with Hematology as of today. I have updated the chart, including problem list and history to the best of my ability.   Today, she states that she feels improved, but still has left upper back pain, constant, worse with inspiration, and now with a more productive cough. Taking all medications prescribed at DC. Hydrating. Sons are staying with her in shifts.   See Assessment and Plan section for Problem Based Charting of issues discussed today.   Health Maintenance Due  Topic Date Due  . TETANUS/TDAP  10/10/1980  . PAP SMEAR-Modifier  10/10/1982  . MAMMOGRAM  10/11/2011  . COLONOSCOPY  10/11/2011  . INFLUENZA VACCINE  07/31/2018   Depression screen PHQ 2/9 02/02/2019  Decreased Interest 0  Down, Depressed, Hopeless 0  PHQ - 2 Score 0    PMHx, SurgHx, SocialHx, Medications, and Allergies were reviewed in the Visit Navigator and updated as appropriate.   Past Medical History:  Diagnosis Date  . Depression   .  Genital warts   . Heart murmur   . Kidney stones      Past Surgical History:  Procedure Laterality Date  . CESAREAN SECTION  1989  . ESOPHAGOGASTRODUODENOSCOPY (EGD) WITH PROPOFOL N/A 02/05/2019   Procedure: ESOPHAGOGASTRODUODENOSCOPY (EGD) WITH PROPOFOL;  Surgeon: Charlott Rakes, MD;  Location: [facility] ENDOSCOPY;  Service: Endoscopy;  Laterality: N/A;    History reviewed. No pertinent family history.  Social History   Tobacco Use  . Smoking status: Former Smoker    Types: Cigarettes    Last attempt to quit: 02/02/2019    Years since quitting: 0.0  . Smokeless tobacco: Never Used  Substance Use Topics  . Alcohol use: No  . Drug use: No    Current Medications and Allergies   .  docusate sodium (COLACE) 50 MG capsule, Take 50 mg by mouth 2 (two) times daily., Disp: , Rfl:  .  nicotine (NICODERM CQ - DOSED IN MG/24 HOURS) 14 mg/24hr patch, Place 1 patch (14 mg total) onto the skin daily., Disp: 28 patch, Rfl: 0 .  oxyCODONE-acetaminophen (PERCOCET) 7.5-325 MG tablet, Take 1 tablet by mouth every 4 (four) hours as needed for moderate pain., Disp: 30 tablet, Rfl: 0 .  pantoprazole (PROTONIX) 40 MG tablet, Take 1 tablet (40 mg total) by mouth 2 (two) times daily., Disp: 30 tablet, Rfl: 1 .  sucralfate (CARAFATE) 1 GM/10ML suspension, Take 10 mLs (1 g total) by mouth 4 (four) times daily -  with meals and at bedtime., Disp: 420 mL, Rfl: 0   Allergies  Allergen Reactions  .  Flexeril [Cyclobenzaprine] Anaphylaxis  . Hydrocodone Anaphylaxis   Review of Systems   Pertinent items are noted in the HPI. Otherwise, a complete ROS is negative.  Vitals   Vitals:   02/11/19 1543  BP: 110/62  Pulse: 97  Temp: 98.6 F (37 C)  TempSrc: Oral  SpO2: 94%  Weight: 120 lb 12.8 oz (54.8 kg)  Height: 5\' 5"  (1.651 m)     Body mass index is 20.1 kg/m.  Physical Exam   Physical Exam Vitals signs and nursing note reviewed.  Constitutional:      General: She is not in acute distress.     Appearance: Normal appearance.  HENT:     Head: Normocephalic and atraumatic.     Right Ear: Tympanic membrane, ear canal and external ear normal.     Left Ear: Tympanic membrane, ear canal and external ear normal.     Nose: Nose normal.     Mouth/Throat:     Mouth: Mucous membranes are moist.      Comments: Tongue ulceration. Others have healed.  Eyes:     Pupils: Pupils are equal, round, and reactive to light.  Neck:     Musculoskeletal: Normal range of motion and neck supple.  Cardiovascular:     Rate and Rhythm: Normal rate and regular rhythm.     Heart sounds: Normal heart sounds.  Pulmonary:     Effort: Pulmonary effort is normal. No respiratory distress.     Breath sounds: Examination of the left-middle field reveals decreased breath sounds. Examination of the left-lower field reveals decreased breath sounds. Decreased breath sounds present. No wheezing, rhonchi or rales.  Abdominal:     Palpations: Abdomen is soft.  Musculoskeletal:     Right lower leg: No edema.     Left lower leg: No edema.  Skin:    General: Skin is warm.     Capillary Refill: Capillary refill takes less than 2 seconds.  Neurological:     General: No focal deficit present.     Mental Status: She is alert.  Psychiatric:        Mood and Affect: Mood normal.        Behavior: Behavior normal.        Thought Content: Thought content normal.      Results for orders placed or performed in visit on 02/11/19  CBC with Differential/Platelet  Result Value Ref Range   WBC 9.5 4.0 - 10.5 K/uL   RBC 3.15 (L) 3.87 - 5.11 Mil/uL   Hemoglobin 9.6 (L) 12.0 - 15.0 g/dL   HCT 16.1 (L) 09.6 - 04.5 %   MCV 88.3 78.0 - 100.0 fl   MCHC 34.4 30.0 - 36.0 g/dL   RDW 40.9 81.1 - 91.4 %   Platelets 378.0 150.0 - 400.0 K/uL   Neutrophils Relative % 81.0 (H) 43.0 - 77.0 %   Lymphocytes Relative 12.1 12.0 - 46.0 %   Monocytes Relative 5.5 3.0 - 12.0 %   Eosinophils Relative 0.6 0.0 - 5.0 %   Basophils Relative 0.8  0.0 - 3.0 %   Neutro Abs 7.7 1.4 - 7.7 K/uL   Lymphs Abs 1.2 0.7 - 4.0 K/uL   Monocytes Absolute 0.5 0.1 - 1.0 K/uL   Eosinophils Absolute 0.1 0.0 - 0.7 K/uL   Basophils Absolute 0.1 0.0 - 0.1 K/uL  Comprehensive metabolic panel  Result Value Ref Range   Sodium 140 135 - 145 mEq/L   Potassium 3.1 (L) 3.5 - 5.1 mEq/L  Chloride 101 96 - 112 mEq/L   CO2 26 19 - 32 mEq/L   Glucose, Bld 88 70 - 99 mg/dL   BUN 8 6 - 23 mg/dL   Creatinine, Ser 4.09 (L) 0.40 - 1.20 mg/dL   Total Bilirubin 0.7 0.2 - 1.2 mg/dL   Alkaline Phosphatase 103 39 - 117 U/L   AST 60 (H) 0 - 37 U/L   ALT 45 (H) 0 - 35 U/L   Total Protein 6.0 6.0 - 8.3 g/dL   Albumin 3.1 (L) 3.5 - 5.2 g/dL   Calcium 8.0 (L) 8.4 - 10.5 mg/dL   GFR 811.91 >47.82 mL/min  Hepatitis C antibody  Result Value Ref Range   Hepatitis C Ab NON-REACTIVE NON-REACTI   SIGNAL TO CUT-OFF 0.01 <1.00   Ct Abdomen Pelvis Wo Contrast Result Date: 02/02/2019 IMPRESSION: 1. Diffuse wall thickening involving the esophageal ampulla or a small hiatal hernia with a maximum thickness of 9 mm. This could be due to inflammation or a mass. Correlation with upper endoscopy is recommended. 2. 4 mm subpleural right lower lobe nodule. No follow-up needed if patient is low-risk. Non-contrast chest CT can be considered in 12 months if patient is high-risk. This recommendation follows the consensus statement: Guidelines for Management of Incidental Pulmonary Nodules Detected on CT Images: From the Fleischner Society 2017; Radiology 2017; 284:228-243. 3. Tiny, nonobstructing upper pole left renal calculus. Aortic Atherosclerosis (ICD10-I70.0).   Dg Chest 2 View Result Date: 02/12/2019 IMPRESSION: 1. Increase in left lung base opacity, consistent with a combination of an increase in pleural fluid and atelectasis. Consider pneumonia in the proper clinical setting. 2. Minimal right lung base atelectasis and pleural fluid. Lungs otherwise clear.  Ct Soft Tissue Neck W  Contrast Result Date: 02/05/2019 IMPRESSION: 1. Right periorbital compartment edema, possibly infectious or inflammatory. No orbital compartment mass or inflammatory process. No abscess. 2. Small left pleural effusion. 3. Mild spondylosis of the cervical spine. Electronically Signed   By: Mitzi Hansen M.D.   On: 02/05/2019 22:52   Ct Chest Wo Contrast Result Date: 02/02/2019 IMPRESSION: 1. Diffuse wall thickening involving the esophageal ampulla or a small hiatal hernia with a maximum thickness of 9 mm. This could be due to inflammation or a mass. Correlation with upper endoscopy is recommended. 2. 4 mm subpleural right lower lobe nodule. No follow-up needed if patient is low-risk. Non-contrast chest CT can be considered in 12 months if patient is high-risk. This recommendation follows the consensus statement: Guidelines for Management of Incidental Pulmonary Nodules Detected on CT Images: From the Fleischner Society 2017; Radiology 2017; 284:228-243. 3. Tiny, nonobstructing upper pole left renal calculus. Aortic Atherosclerosis (ICD10-I70.0).   Mr Mrv Head Wo Cm Result Date: 02/02/2019 IMPRESSION: No flow related signal in the left transverse and sigmoid dural venous sinuses. Large caliber right-sided transverse drainage system. Findings likely likely represent variant anatomy. Thrombosis is considered unlikely.  Vas [place] Lower Extremity Venous Result Date: 02/05/2019 Summary: Right: There is no evidence of deep vein thrombosis in the lower extremity. No cystic structure found in the popliteal fossa. Left: There is no evidence of deep vein thrombosis in the lower extremity. No cystic structure found in the popliteal fossa.   Vas [place] Upper Extremity Venous Duplex Result Date: 02/03/2019 Summary: No evidence of deep vein or superficial vein thrombosis involving the right and left upper extremities.    Ct Angio Abd/pel W/ And/or W/o Result Date: 02/05/2019 IMPRESSION: VASCULAR 1. No arterial or  venous thrombosis within the abdomen  or pelvis. NON-VASCULAR 1. Overall abnormal left nephrogram with wedge-shaped areas of low density in the left upper pole of the kidney with small amount of perinephric fluid and hemorrhage. Differential considerations include pyelonephritis from typical etiology or atypical etiologies such as TB versus infiltrating lesions such as lymphoma versus less likely renal infarct. This appearance can also be seen in the setting of trauma, but no apparent history of trauma is noted within the chart. 2. Bilateral moderate hydroureteronephrosis likely resulting from severe bladder distension or vesicoureteral reflux. 3. Small left pleural effusion.  Bibasilar atelectasis. Electronically Signed   By: Elige Ko   On: 02/05/2019 23:27   Assessment and Plan   Elevated LFTs Lab Results  Component Value Date   ALT 45 (H) 02/11/2019   AST 60 (H) 02/11/2019   ALKPHOS 103 02/11/2019   BILITOT 0.7 02/11/2019   CT ABDOMEN PELVIS FINDINGS 2/20: Hepatobiliary: Small area of focal fat deposition adjacent to the falciform ligament in the medial segment of the left lobe of the liver. Mildly dilated gallbladder. No gallbladder wall thickening or pericholecystic fluid and no visible gallstones. Pancreas: Unremarkable. No pancreatic ductal dilatation or surrounding inflammatory changes.  Will continue to monitor until trends to normal.    Anemia CBC Latest Ref Rng & Units 02/11/2019 02/08/2019 02/07/2019  WBC 4.0 - 10.5 K/uL 9.5 12.8(H) 10.5  Hemoglobin 12.0 - 15.0 g/dL 1.6(X) 0.9(U) 0.4(V)  Hematocrit 36.0 - 46.0 % 27.8(L) 29.7(L) 29.6(L)  Platelets 150.0 - 400.0 K/uL 378.0 159 124(L)   No results found for: IRON, TIBC, FERRITIN  No results found for: VITAMINB12  Hypokalemia Basic Metabolic Panel: Recent Labs  Lab 02/07/19 0346 02/08/19 0443 02/11/19 1700  NA 137 139 140  K 3.0* 3.7 3.1*  CL 103 105 101  CO2 23 21* 26  GLUCOSE 100* 87 88  BUN 12 8 8   CREATININE 0.56  0.49 0.38*  CALCIUM 7.4* 7.8* 8.0*  PHOS 2.8 1.6*  --    Replaced in the hospital. Low again. Will call in potassium and ask her to take magnesium as well.   History of sepsis, 2/20 Blood culture grew Citrobacter Koseri. Urine culture grew gram-negative rods, less than 10,000 CFU per milliliter. Patient was treated with IV cefepime during the hospital stay, and transition to Rocephin. Patient instructions were to be discharged on oral antibiotics, but she was not.   Acute kidney injury with severe thrombocytopenia: Exact etiology is uncertain, and most likely multifactorial.  Resolved prior to discharge.   Lab Results  Component Value Date   CREATININE 0.38 (L) 02/11/2019   CREATININE 0.49 02/08/2019   CREATININE 0.56 02/07/2019    Ulcerative esophagitis, non-bleeding gastric ulcers, duodenal ulcers on EGD 02/05/19 EGD 02/05/19. - LA Grade D esophagitis. Congested, erythematous, inflamed and ulcerated mucosa in the prepyloric region of the stomach and pylorus. Non-bleeding gastric ulcers with no stigmata of bleeding. Mucosal changes in the duodenum.Diffuse ulcers in examined duodenum; pyloric channel and esophagus concerning for an ischemic process and will need further evaluation of that with a CT angiogram. Patient was managed with IV Protonix and discharged back on oral Protonix. Biopsy was not done due to low platelets.     Tongue ulceration Dental/oral surgery directed care. Patient was managed supportively with viscous lidocaine. Tongue has improved significantly. Still with large ulcer on left side of tongue that looks clean. Painful, but patient dislikes the Lidocaine rinse. Will provide triamcinolone in orabase.   History of anaphylaxis, unclear cause Etiology is unclear. History of  anaphylaxis, possibly to Flexeril or Hydrocodone. Will make sure to ask Allergist to weight in for the future. Will refill EpiPen.   Smoker within last 12 months, quit during hospitalization, 2/20,  using nicotine patches No tobacco use since hospitalization and highly motivated to continue cessation. Continue nicotine patches.   Aortic valve calcification, ECHO 2/20 ECHO IMPRESSIONS   1. The left ventricle has hyperdynamic systolic function of >65%. The cavity size is normal. There is no increased left ventricular wall thickness. Echo evidence of impaired relaxation diastolic filling patterns. Normal left ventricular filling pressures.  2. No evidence of left ventricular regional wall motion abnormalities.  3. There is a false tendon in the mid LV cavity of no clinical significance.  4. The right ventricle is normal in size. There is no increase in right ventricular wall thickness. There is normal systolic function.  5. The aortic valve is tricuspid in structure. There is mild thickening and moderate calcification of the aortic valve.  6. The mitral valve is normal in structure.  Hydronephrosis, bilateral CT 02/05/19 Adrenals/Urinary Tract: Adrenal glands are unremarkable. Bilateral moderate hydroureteronephrosis likely resulting from severe bladder distension. No urolithiasis. Overall abnormal left nephrogram with wedge-shaped areas of low density in the left upper pole of the kidney with small amount of perinephric fluid and hemorrhage. Multiple hypodense, fluid attenuating renal masses bilaterally most consistent with cysts.   Will make sure that she has follow up with [facility].  Incidental lung nodule, 4 mm subpleural RLL found on CT 2/20 Incidental finding. Long-time smoker. Will likely get yearly LDCT.   Pleural effusion With new symptoms today, obtained CXR. Pleural effusion on left. Recent hospitalization, so concern for developing HAP. Will ask Pulmonology to evaluate her to see if thoracentesis recommended. Will treat with Levaquin until she is seen. Note: Low dose given since she will see Pulmonolgy quickly. Red flags reviewed. Discussed using incentive spirometer.    Orders Placed This Encounter  Procedures  . DG Chest 2 View  . CBC with Differential/Platelet  . Comprehensive metabolic panel  . Hepatitis C antibody  . Ambulatory referral to Pulmonology  . Ambulatory referral to Nephrology  . Ambulatory referral to Gastroenterology   Meds ordered this encounter  Medications  . triamcinolone (KENALOG) 0.1 % paste    Sig: Use as directed 1 application in the mouth or throat 2 (two) times daily.    Dispense:  5 g    Refill:  12  . levofloxacin (LEVAQUIN) 250 MG tablet    Sig: Take 1 tablet (250 mg total) by mouth daily for 5 days.    Dispense:  5 tablet    Refill:  0  . potassium chloride (K-DUR) 10 MEQ tablet    Sig: Take 1 tablet (10 mEq total) by mouth 3 (three) times daily.    Dispense:  20 tablet    Refill:  0   . Reviewed expectations re: course of current medical issues. . Discussed self-management of symptoms. . Outlined signs and symptoms indicating need for more acute intervention. . Patient verbalized understanding and all questions were answered. Marland Kitchen. Health Maintenance issues including appropriate healthy diet, exercise, and smoking avoidance were discussed with patient. . See orders for this visit as documented in the electronic medical record. . Patient received an After Visit Summary.  Helane RimaErica Pardeep Pautz, DO [place], [place] 02/13/2019  Records requested if needed. Time spent with the patient: 60 minutes, of which >50% was spent in obtaining information about her symptoms, reviewing her previous  labs, evaluations, and treatments, counseling her about her condition (please see the discussed topics above), and developing a plan to further investigate it; she had a number of questions which I addressed.

## 2019-02-12 ENCOUNTER — Encounter: Payer: Self-pay | Admitting: Family Medicine

## 2019-02-12 DIAGNOSIS — J90 Pleural effusion, not elsewhere classified: Secondary | ICD-10-CM

## 2019-02-12 DIAGNOSIS — D649 Anemia, unspecified: Secondary | ICD-10-CM | POA: Insufficient documentation

## 2019-02-12 DIAGNOSIS — N1330 Unspecified hydronephrosis: Secondary | ICD-10-CM

## 2019-02-12 DIAGNOSIS — R945 Abnormal results of liver function studies: Secondary | ICD-10-CM | POA: Insufficient documentation

## 2019-02-12 DIAGNOSIS — Z87892 Personal history of anaphylaxis: Secondary | ICD-10-CM | POA: Insufficient documentation

## 2019-02-12 DIAGNOSIS — N200 Calculus of kidney: Secondary | ICD-10-CM

## 2019-02-12 DIAGNOSIS — K449 Diaphragmatic hernia without obstruction or gangrene: Secondary | ICD-10-CM | POA: Insufficient documentation

## 2019-02-12 DIAGNOSIS — M5136 Other intervertebral disc degeneration, lumbar region: Secondary | ICD-10-CM | POA: Insufficient documentation

## 2019-02-12 DIAGNOSIS — I359 Nonrheumatic aortic valve disorder, unspecified: Secondary | ICD-10-CM | POA: Insufficient documentation

## 2019-02-12 DIAGNOSIS — R911 Solitary pulmonary nodule: Secondary | ICD-10-CM | POA: Insufficient documentation

## 2019-02-12 DIAGNOSIS — Z87891 Personal history of nicotine dependence: Secondary | ICD-10-CM | POA: Insufficient documentation

## 2019-02-12 DIAGNOSIS — Q6102 Congenital multiple renal cysts: Secondary | ICD-10-CM | POA: Insufficient documentation

## 2019-02-12 DIAGNOSIS — R7989 Other specified abnormal findings of blood chemistry: Secondary | ICD-10-CM

## 2019-02-12 DIAGNOSIS — Z87442 Personal history of urinary calculi: Secondary | ICD-10-CM | POA: Insufficient documentation

## 2019-02-12 DIAGNOSIS — K2210 Ulcer of esophagus without bleeding: Secondary | ICD-10-CM | POA: Insufficient documentation

## 2019-02-12 DIAGNOSIS — I700 Atherosclerosis of aorta: Secondary | ICD-10-CM | POA: Insufficient documentation

## 2019-02-12 HISTORY — DX: Diaphragmatic hernia without obstruction or gangrene: K44.9

## 2019-02-12 HISTORY — DX: Personal history of anaphylaxis: Z87.892

## 2019-02-12 HISTORY — DX: Abnormal results of liver function studies: R94.5

## 2019-02-12 HISTORY — DX: Anemia, unspecified: D64.9

## 2019-02-12 HISTORY — DX: Unspecified hydronephrosis: N13.30

## 2019-02-12 HISTORY — DX: Pleural effusion, not elsewhere classified: J90

## 2019-02-12 HISTORY — DX: Ulcer of esophagus without bleeding: K22.10

## 2019-02-12 HISTORY — DX: Other specified abnormal findings of blood chemistry: R79.89

## 2019-02-12 HISTORY — DX: Calculus of kidney: N20.0

## 2019-02-12 LAB — CBC WITH DIFFERENTIAL/PLATELET
Basophils Absolute: 0.1 10*3/uL (ref 0.0–0.1)
Basophils Relative: 0.8 % (ref 0.0–3.0)
Eosinophils Absolute: 0.1 10*3/uL (ref 0.0–0.7)
Eosinophils Relative: 0.6 % (ref 0.0–5.0)
HCT: 27.8 % — ABNORMAL LOW (ref 36.0–46.0)
Hemoglobin: 9.6 g/dL — ABNORMAL LOW (ref 12.0–15.0)
Lymphocytes Relative: 12.1 % (ref 12.0–46.0)
Lymphs Abs: 1.2 10*3/uL (ref 0.7–4.0)
MCHC: 34.4 g/dL (ref 30.0–36.0)
MCV: 88.3 fl (ref 78.0–100.0)
Monocytes Absolute: 0.5 10*3/uL (ref 0.1–1.0)
Monocytes Relative: 5.5 % (ref 3.0–12.0)
Neutro Abs: 7.7 10*3/uL (ref 1.4–7.7)
Neutrophils Relative %: 81 % — ABNORMAL HIGH (ref 43.0–77.0)
Platelets: 378 10*3/uL (ref 150.0–400.0)
RBC: 3.15 Mil/uL — ABNORMAL LOW (ref 3.87–5.11)
RDW: 14.2 % (ref 11.5–15.5)
WBC: 9.5 10*3/uL (ref 4.0–10.5)

## 2019-02-12 LAB — COMPREHENSIVE METABOLIC PANEL
ALT: 45 U/L — ABNORMAL HIGH (ref 0–35)
AST: 60 U/L — ABNORMAL HIGH (ref 0–37)
Albumin: 3.1 g/dL — ABNORMAL LOW (ref 3.5–5.2)
Alkaline Phosphatase: 103 U/L (ref 39–117)
BUN: 8 mg/dL (ref 6–23)
CO2: 26 mEq/L (ref 19–32)
Calcium: 8 mg/dL — ABNORMAL LOW (ref 8.4–10.5)
Chloride: 101 mEq/L (ref 96–112)
Creatinine, Ser: 0.38 mg/dL — ABNORMAL LOW (ref 0.40–1.20)
GFR: 174.34 mL/min (ref >60.00)
Glucose, Bld: 88 mg/dL (ref 70–99)
Potassium: 3.1 mEq/L — ABNORMAL LOW (ref 3.5–5.1)
Sodium: 140 mEq/L (ref 135–145)
Total Bilirubin: 0.7 mg/dL (ref 0.2–1.2)
Total Protein: 6 g/dL (ref 6.0–8.3)

## 2019-02-12 LAB — HEPATITIS C ANTIBODY
Hepatitis C Ab: NONREACTIVE
SIGNAL TO CUT-OFF: 0.01 (ref <1.00)

## 2019-02-12 MED ORDER — LEVOFLOXACIN 250 MG PO TABS: 250.0000 mg | ORAL_TABLET | Freq: Every day | ORAL | 0 refills | Status: AC

## 2019-02-12 MED ORDER — POTASSIUM CHLORIDE ER 10 MEQ PO TBCR: 10.0000 meq | EXTENDED_RELEASE_TABLET | Freq: Three times a day (TID) | ORAL | 0 refills | Status: DC

## 2019-02-12 NOTE — Assessment & Plan Note (Addendum)
CBC Latest Ref Rng & Units 02/11/2019 02/08/2019 02/07/2019  WBC 4.0 - 10.5 K/uL 9.5 12.8(H) 10.5  Hemoglobin 12.0 - 15.0 g/dL 6.6(Y) 4.0(H) 4.7(Q)  Hematocrit 36.0 - 46.0 % 27.8(L) 29.7(L) 29.6(L)  Platelets 150.0 - 400.0 K/uL 378.0 159 124(L)   No results found for: IRON, TIBC, FERRITIN  No results found for: [id number]

## 2019-02-12 NOTE — Assessment & Plan Note (Addendum)
Lab Results  Component Value Date   ALT 45 (H) 02/11/2019   AST 60 (H) 02/11/2019   ALKPHOS 103 02/11/2019   BILITOT 0.7 02/11/2019   CT ABDOMEN PELVIS FINDINGS 2/20: Hepatobiliary: Small area of focal fat deposition adjacent to the falciform ligament in the medial segment of the left lobe of the liver. Mildly dilated gallbladder. No gallbladder wall thickening or pericholecystic fluid and no visible gallstones. Pancreas: Unremarkable. No pancreatic ductal dilatation or surrounding inflammatory changes.  Will continue to monitor until trends to normal.

## 2019-02-13 ENCOUNTER — Encounter: Payer: Self-pay | Admitting: Pulmonary Disease

## 2019-02-13 ENCOUNTER — Ambulatory Visit (INDEPENDENT_AMBULATORY_CARE_PROVIDER_SITE_OTHER): Payer: 59 | Admitting: Pulmonary Disease

## 2019-02-13 VITALS — BP 96/60 | HR 89 | Ht 65.0 in | Wt 117.8 lb

## 2019-02-13 DIAGNOSIS — Z8619 Personal history of other infectious and parasitic diseases: Secondary | ICD-10-CM | POA: Insufficient documentation

## 2019-02-13 DIAGNOSIS — J181 Lobar pneumonia, unspecified organism: Secondary | ICD-10-CM

## 2019-02-13 DIAGNOSIS — E876 Hypokalemia: Secondary | ICD-10-CM | POA: Insufficient documentation

## 2019-02-13 DIAGNOSIS — K140 Glossitis: Secondary | ICD-10-CM | POA: Insufficient documentation

## 2019-02-13 NOTE — Patient Instructions (Addendum)
I am glad you are doing well after your recent hospitalization Chest x-ray does show a left lung opacity with some fluid suggestive of pneumonia Continue the Levaquin as ordered by Dr. Earlene Plater I will refer you to IR for evaluation of ultrasound-guided thoracentesis Once this is done we will get a CT chest to make sure there is no underlying lung abnormality  Follow-up in 2 to 4 weeks.

## 2019-02-13 NOTE — Assessment & Plan Note (Signed)
With new symptoms today, obtained CXR. Pleural effusion on left. Recent hospitalization, so concern for developing HAP. Will ask Pulmonology to evaluate her to see if thoracentesis recommended. Will treat with Levaquin until she is seen. Note: Low dose given since she will see Pulmonolgy quickly. Red flags reviewed. Discussed using incentive spirometer.

## 2019-02-13 NOTE — Assessment & Plan Note (Signed)
No tobacco use since hospitalization and highly motivated to continue cessation. Continue nicotine patches.

## 2019-02-13 NOTE — Assessment & Plan Note (Signed)
Incidental finding. Long-time smoker. Will likely get yearly LDCT.

## 2019-02-13 NOTE — Assessment & Plan Note (Signed)
Etiology is unclear. History of anaphylaxis, possibly to Flexeril or Hydrocodone. Will make sure to ask Allergist to weight in for the future. Will refill EpiPen.

## 2019-02-13 NOTE — Assessment & Plan Note (Signed)
Basic Metabolic Panel: Recent Labs  Lab 02/07/19 0346 02/08/19 0443 02/11/19 1700  NA 137 139 140  K 3.0* 3.7 3.1*  CL 103 105 101  CO2 23 21* 26  GLUCOSE 100* 87 88  BUN 12 8 8   CREATININE 0.56 0.49 0.38*  CALCIUM 7.4* 7.8* 8.0*  PHOS 2.8 1.6*  --    Replaced in the hospital. Low again. Will call in potassium and ask her to take magnesium as well.

## 2019-02-13 NOTE — Assessment & Plan Note (Addendum)
Blood culture grew Citrobacter Koseri. Urine culture grew gram-negative rods, less than 10,000 CFU per milliliter. Patient was treated with IV cefepime during the hospital stay, and transition to Rocephin. Patient instructions were to be discharged on oral antibiotics, but she was not.   Acute kidney injury with severe thrombocytopenia: Exact etiology is uncertain, and most likely multifactorial.  Resolved prior to discharge.   Lab Results  Component Value Date   CREATININE 0.38 (L) 02/11/2019   CREATININE 0.49 02/08/2019   CREATININE 0.56 02/07/2019

## 2019-02-13 NOTE — Assessment & Plan Note (Signed)
Dental/oral surgery directed care. Patient was managed supportively with viscous lidocaine. Tongue has improved significantly. Still with large ulcer on left side of tongue that looks clean. Painful, but patient dislikes the Lidocaine rinse. Will provide triamcinolone in orabase.

## 2019-02-13 NOTE — Progress Notes (Signed)
Sarah Fry    [id number]    02-23-1961  Primary Care Physician:Wallace, Alcario DroughtErica, DO  Referring Physician: Helane RimaWallace, Erica, DO [address] [place], [place] [address]  Chief complaint:  Consult for left pleural effusion.   HPI: 58 year old smoker with past medical history of nephrolithiasis  Admitted to [facility] from 2/3-2/9 with generalized edema, swollen tongue after recent [place] with [place].  Noted to have sepsis with acute kidney injury with proteinuria, low platelets of unclear etiology.  Initial concern was for thrombotic microangiopathy such as TTP/HUS however she did not have any evidence of hemolysis with no schistocytes, normal haptoglobin.  There is no evidence of DIC.  She eventually grew out Citrobacter koseri in her blood which is thought to be from urinary source with a positive UA.  Noted to have prior urine cultures with the same organism.  Urine cultures on this admission were negative however they were drawn after several days of antibiotic therapy.  Also noted to have esophageal thickening on CT scan.  She underwent EGD which showed diffuse ulceration from the esophagus down to the duodenum, possible ischemic changes however a CTA did not show any arterial or venous vascular obstruction.  Supportive care recommended by GI.  She was also seen by oral surgery for tongue ulcerations and viscous lidocaine recommended.  Other consults on this admission included nephrology, hematology.  She has followed up with her primary care with a repeat x-ray showing increasing opacities at the left base with possible effusion.  Of note lung imaging during the hospitalization showed bibasal atelectasis with small pleural effusions.  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  Quit last month Travel history: Several trips to [place] over the  past year Relevant family history: No significant family history of lung disease.  Outpatient Encounter Medications as of 02/13/2019  Medication Sig  . levofloxacin (LEVAQUIN) 250 MG tablet Take 1 tablet (250 mg total) by mouth daily for 5 days.  . nicotine (NICODERM CQ - DOSED IN MG/24 HOURS) 14 mg/24hr patch Place 1 patch (14 mg total) onto the skin daily.  Marland Kitchen. oxyCODONE-acetaminophen (PERCOCET) 7.5-325 MG tablet Take 1 tablet by mouth every 4 (four) hours as needed for moderate pain.  . pantoprazole (PROTONIX) 40 MG tablet Take 1 tablet (40 mg total) by mouth 2 (two) times daily.  . potassium chloride (K-DUR) 10 MEQ tablet Take 1 tablet (10 mEq total) by mouth 3 (three) times daily.  . sucralfate (CARAFATE) 1 GM/10ML suspension Take 10 mLs (1 g total) by mouth 4 (four) times daily -  with meals and at bedtime.  . docusate sodium (COLACE) 50 MG capsule Take 50 mg by mouth 2 (two) times daily.  Marland Kitchen. triamcinolone (KENALOG) 0.1 % paste Use as directed 1 application in the mouth or throat 2 (two) times daily. (Patient not taking: Reported on 02/13/2019)   No facility-administered encounter medications on file as of 02/13/2019.     Allergies as of 02/13/2019 - Review Complete 02/13/2019  Allergen Reaction Noted  . Flexeril [cyclobenzaprine] Anaphylaxis 02/02/2019  . Hydrocodone Anaphylaxis 02/11/2019    Past Medical History:  Diagnosis Date  . Depression   . Genital warts   . Heart murmur   . Kidney stones   . Sepsis (HCC) 2020    Past Surgical History:  Procedure Laterality Date  . CESAREAN SECTION  1989  .  ESOPHAGOGASTRODUODENOSCOPY (EGD) WITH PROPOFOL N/A 02/05/2019   Procedure: ESOPHAGOGASTRODUODENOSCOPY (EGD) WITH PROPOFOL;  Surgeon: Charlott Rakes, MD;  Location: [facility] ENDOSCOPY;  Service: Endoscopy;  Laterality: N/A;    Family History  Problem Relation Age of Onset  . Alzheimer's disease Mother   . Liver disease Father     Social History   Socioeconomic History  . Marital  status: Divorced    Spouse name: Not on file  . Number of children: 2  . Years of education: Not on file  . Highest education level: Not on file  Occupational History    Employer: [employer]  Social Needs  . Financial resource strain: Not on file  . Food insecurity:    Worry: Not on file    Inability: Not on file  . Transportation needs:    Medical: Not on file    Non-medical: Not on file  Tobacco Use  . Smoking status: Former Smoker    Packs/day: 1.00    Years: 40.00    Pack years: 40.00    Types: Cigarettes    Last attempt to quit: 02/02/2019    Years since quitting: 0.0  . Smokeless tobacco: Never Used  Substance and Sexual Activity  . Alcohol use: No  . Drug use: No  . Sexual activity: Not on file  Lifestyle  . Physical activity:    Days per week: Not on file    Minutes per session: Not on file  . Stress: Not on file  Relationships  . Social connections:    Talks on phone: Not on file    Gets together: Not on file    Attends religious service: Not on file    Active member of club or organization: Not on file    Attends meetings of clubs or organizations: Not on file    Relationship status: Not on file  . Intimate partner violence:    Fear of current or ex partner: Not on file    Emotionally abused: Not on file    Physically abused: Not on file    Forced sexual activity: Not on file  Other Topics Concern  . Not on file  Social History Narrative   Twin sons that live nearby and are very supportive. Ex-husband lives in [place]. Good relationship.       Tobacco user (began at age 37, smokes 1 pack/day).  Additionally she also uses occasional marijuana.  No IV drug abuse or narcotics. STOPPED SMOKING 2/20 DURING HOSPITALIZATION.     Review of systems: Review of Systems  Constitutional: Negative for fever and chills.  HENT: Negative.   Eyes: Negative for blurred vision.  Respiratory: as per HPI  Cardiovascular: Negative for chest pain and  palpitations.  Gastrointestinal: Negative for vomiting, diarrhea, blood per rectum. Genitourinary: Negative for dysuria, urgency, frequency and hematuria.  Musculoskeletal: Negative for myalgias, back pain and joint pain.  Skin: Negative for itching and rash.  Neurological: Negative for dizziness, tremors, focal weakness, seizures and loss of consciousness.  Endo/Heme/Allergies: Negative for environmental allergies.  Psychiatric/Behavioral: Negative for depression, suicidal ideas and hallucinations.  All other systems reviewed and are negative.  Physical Exam: Blood pressure 96/60, pulse 89, height 5\' 5"  (1.651 m), weight 117 lb 12.8 oz (53.4 kg), SpO2 98 %. Gen:      No acute distress HEENT:  EOMI, sclera anicteric Neck:     No masses; no thyromegaly Lungs:    Clear to auscultation bilaterally; normal respiratory effort CV:  Regular rate and rhythm; no murmurs Abd:      + bowel sounds; soft, non-tender; no palpable masses, no distension Ext:    No edema; adequate peripheral perfusion Skin:      Warm and dry; no rash Neuro: alert and oriented x 3 Psych: normal mood and affect  Data Reviewed: Imaging: CT Chest / ABD / Pelvis 2/3 >> no cardiomegaly, mild aortic calcifications, small 45mm lung nodule RLL, C  T and L spine degenerative changes, no gallstones, mild gallbladder dilation, small L renal calculus, no hydronephrosis, wall thickening of esophageal ampulla   UE Venous Duplex upper and lower 2/3, 2/6 >> negative   MRV non-con 2/3>> no flow related signal in L transverse and sigmoid venous sinuses. Read as likely anatomical variant   CT soft tissue neck 2/6>> right periorbital edema, small left pleural effusion.  CT abdpelvis with contrast 2/6 >> no arterial or venous thrombosis within abdomen or pelvis.  Wedge-shaped area of low density in the left upper kidney with small amount of perinephric fluid and hemorrhage.  Bilateral moderate hydro-ureteronephrosis. Bi basal  atelectasis with small left effusion.  Labs: CBC Recent Labs    02/11/19 1700  WBC 9.5  HGB 9.6*  HCT 27.8*  PLT 378.0    Coag's No results for input(s): APTT, INR in the last 72 hours.  BMET Recent Labs    02/11/19 1700  NA 140  K 3.1*  CL 101  CO2 26  BUN 8  CREATININE 0.38*  GLUCOSE 88   Liver Enzymes Recent Labs    02/11/19 1700  AST 60*  ALT 45*  ALKPHOS 103  BILITOT 0.7  ALBUMIN 3.1*   Assessment:  Evaluation for pleural effusion Patient had a recent hospitalization with Citrobacter septicemia associated with acute kidney injury, thrombocytopenia.  Now with increasing left lower lung opacity with possible effusion.  She may have developed hospital-acquired pneumonia. She is currently on levofloxacin per her primary care We will refer to IR for an ultrasound-guided thoracentesis. After the fluid has been drained she will need a CT scan of the chest to make sure there is no underlying lung abnormality.  Plan/Recommendations: - Ultrasound-guided thoracentesis - Follow-up in 2 weeks  Chilton Greathouse MD [facility] 02/13/2019, 10:47 AM  CC: Helane Rima, DO

## 2019-02-13 NOTE — Assessment & Plan Note (Signed)
CT 02/05/19 Adrenals/Urinary Tract: Adrenal glands are unremarkable. Bilateral moderate hydroureteronephrosis likely resulting from severe bladder distension. No urolithiasis. Overall abnormal left nephrogram with wedge-shaped areas of low density in the left upper pole of the kidney with small amount of perinephric fluid and hemorrhage. Multiple hypodense, fluid attenuating renal masses bilaterally most consistent with cysts.   Will make sure that she has follow up with [facility].

## 2019-02-13 NOTE — Assessment & Plan Note (Addendum)
EGD 02/05/19. - LA Grade D esophagitis. Congested, erythematous, inflamed and ulcerated mucosa in the prepyloric region of the stomach and pylorus. Non-bleeding gastric ulcers with no stigmata of bleeding. Mucosal changes in the duodenum.Diffuse ulcers in examined duodenum; pyloric channel and esophagus concerning for an ischemic process and will need further evaluation of that with a CT angiogram. Patient was managed with IV Protonix and discharged back on oral Protonix. Biopsy was not done due to low platelets.

## 2019-02-13 NOTE — Assessment & Plan Note (Signed)
ECHO IMPRESSIONS   1. The left ventricle has hyperdynamic systolic function of >65%. The cavity size is normal. There is no increased left ventricular wall thickness. Echo evidence of impaired relaxation diastolic filling patterns. Normal left ventricular filling pressures.  2. No evidence of left ventricular regional wall motion abnormalities.  3. There is a false tendon in the mid LV cavity of no clinical significance.  4. The right ventricle is normal in size. There is no increase in right ventricular wall thickness. There is normal systolic function.  5. The aortic valve is tricuspid in structure. There is mild thickening and moderate calcification of the aortic valve.  6. The mitral valve is normal in structure.

## 2019-02-16 ENCOUNTER — Telehealth: Payer: Self-pay | Admitting: Family Medicine

## 2019-02-16 ENCOUNTER — Other Ambulatory Visit: Payer: Self-pay | Admitting: Family Medicine

## 2019-02-16 DIAGNOSIS — E876 Hypokalemia: Secondary | ICD-10-CM

## 2019-02-16 MED ORDER — ALPRAZOLAM 0.5 MG PO TABS: 0.5000 mg | ORAL_TABLET | Freq: Every evening | ORAL | 0 refills | Status: DC | PRN

## 2019-02-16 NOTE — Telephone Encounter (Signed)
I'm okay with Xanax and will call in. However, I worry about NOT getting the thoracentesis. Call Pulmonology to get okay OR come in for repeat xray today and will get repeat CT in a few weeks.

## 2019-02-16 NOTE — Telephone Encounter (Signed)
Called patient she will keep app as recommended. She would like to have medications called in. She will let us know if any other questions. Let her know that you are in with patient to call pharmacy before she goes by to pick up.

## 2019-02-16 NOTE — Telephone Encounter (Signed)
See note  Copied from CRM [id number]. Topic: General - Other >> Feb 16, 2019  9:38 AM Percival Spanish wrote:  Pt ask if Joellen would call her concerning a procedure she has scheduled tomorrow

## 2019-02-16 NOTE — Telephone Encounter (Signed)
She states she is feeling great and she is afraid that it may be more of a risk for her to have it. Wants to know if she can C/a the thoracentesis. She also wants to see about stopping the pain meds and going on something like xanax to help with sleep.

## 2019-02-16 NOTE — Telephone Encounter (Signed)
Pt would like joellen to call her back concerning cancelling the thoracentesis tomorrow

## 2019-02-17 ENCOUNTER — Ambulatory Visit (HOSPITAL_COMMUNITY)
Admission: RE | Admit: 2019-02-17 | Discharge: 2019-02-17 | Disposition: A | Discharge status: Home / Self Care | Payer: 59 | Source: Ambulatory Visit | Attending: Pulmonary Disease | Admitting: Pulmonary Disease

## 2019-02-17 ENCOUNTER — Other Ambulatory Visit: Payer: Self-pay | Admitting: Pulmonary Disease

## 2019-02-17 DIAGNOSIS — J181 Lobar pneumonia, unspecified organism: Secondary | ICD-10-CM | POA: Diagnosis not present

## 2019-02-17 MED ORDER — LIDOCAINE HCL (PF) 1 % IJ SOLN
INTRAMUSCULAR | Status: DC | PRN
  Administered 2019-02-17: 5 mL

## 2019-02-17 MED ORDER — LIDOCAINE HCL 1 % IJ SOLN
INTRAMUSCULAR | Status: AC
  Filled 2019-02-17: qty 20

## 2019-02-17 NOTE — Progress Notes (Signed)
  I was present during limited US of the left chest.  There is a small effusion present. There is no significant pocket of fluid to allow for safe thoracentesis.  Risks and benefits of thoracentesis were discussed with the patient including, but not limited to bleeding, infection, pneumothorax, and that fact that all the fluid may not be removed during today's procedure.  The patient agrees that the risks outweigh the benefit at this time.  Pihu Basil S Roben Tatsch PA-C 02/17/2019 10:32 AM

## 2019-02-18 ENCOUNTER — Ambulatory Visit: Payer: 59 | Admitting: Gastroenterology

## 2019-02-18 ENCOUNTER — Encounter: Payer: Self-pay | Admitting: Gastroenterology

## 2019-02-18 VITALS — BP 100/60 | HR 68 | Temp 98.2°F | Ht 65.0 in | Wt 111.0 lb

## 2019-02-18 DIAGNOSIS — K209 Esophagitis, unspecified without bleeding: Secondary | ICD-10-CM

## 2019-02-18 DIAGNOSIS — D649 Anemia, unspecified: Secondary | ICD-10-CM | POA: Diagnosis not present

## 2019-02-18 DIAGNOSIS — R945 Abnormal results of liver function studies: Secondary | ICD-10-CM | POA: Diagnosis not present

## 2019-02-18 DIAGNOSIS — R7989 Other specified abnormal findings of blood chemistry: Secondary | ICD-10-CM

## 2019-02-18 DIAGNOSIS — K279 Peptic ulcer, site unspecified, unspecified as acute or chronic, without hemorrhage or perforation: Secondary | ICD-10-CM | POA: Diagnosis not present

## 2019-02-18 DIAGNOSIS — Z791 Long term (current) use of non-steroidal anti-inflammatories (NSAID): Secondary | ICD-10-CM

## 2019-02-18 MED ORDER — PANTOPRAZOLE SODIUM 40 MG PO TBEC: 40.0000 mg | DELAYED_RELEASE_TABLET | Freq: Two times a day (BID) | ORAL | 2 refills | Status: DC

## 2019-02-18 NOTE — Patient Instructions (Addendum)
Start Colace 100mg  twice daily.   Your provider has requested that you go to the basement level for lab work in 2 weeks . Press "B" on the elevator. The lab is located at the first door on the left as you exit the elevator.   We have sent the following medications to your pharmacy for you to pick up at your convenience:  Protonix   If you are age 58 or younger, your body mass index should be between 19-25. Your Body mass index is 18.47 kg/m. If this is out of the aformentioned range listed, please consider follow up with your Primary Care Provider.   We have placed a recall for 2 months for Endo/ [facility] . We will contact you at that time to schedule.    Thank you for choosing me and [facility].  Dr. Meridee Score

## 2019-02-18 NOTE — Progress Notes (Signed)
GASTROENTEROLOGY OUTPATIENT CLINIC VISIT   Primary Care Provider Helane Rima, DO [address] [place] [address] [phone]  Referring Provider Helane Rima, DO [address] [place], [place] [address] [phone]  Patient Profile: Sarah Fry is a 58 y.o. female with a pmh significant for depression, nephrolithiasis, esophagitis, gastritis, PUD, gastric ulcers, recent urosepsis.  The patient presents to the [facility] for an evaluation and management of problem(s) noted below:  Problem List 1. Acute esophagitis   2. PUD (peptic ulcer disease)   3. Anemia, unspecified type   4. Abnormal LFTs   5. History NSAID acute use     History of Present Illness: This is the patient's first visit to the outpatient [facility].  The patient is referred for further work-up and follow-up of her recent hospitalization earlier this month.  The patient presented to the hospital in the setting of significant pain, acute renal insufficiency, septic physiology.  The patient had been taking nonsteroidals for toothache prior to her admission.  She was found to have bacteremia consistent with Citrobacter and this was also found on a previous urinary tract culture from 2018.  She presented with septic physiology in the setting of thrombocytopenia and was managed for Citrobacter urosepsis.  There was concern for TTP versus HUS but the work-up during the hospital was unremarkable.  The patient was evaluated by the [facility] gastroenterology team because CT imaging had suggested thickening in the esophagus.  An upper endoscopy was performed with results as below but showed significant esophagitis as well as gastric ulceration and duodenitis.  There was concern for possible underlying ischemia however a CT angios of the abdomen pelvis was unremarkable as was a vascular surgery consult.  There was concern for hydroureteronephrosis as well.  Patient was initiated on PPIs  and then eventually was discharged.  She followed up with her primary care provider who was a new patient for her on 12 February.  She was subsequently referred to [facility].  On today's visit the patient is accompanied by her son.  She states that she is perfectly well and no longer having any issues.  She denies any heartburn or abdominal pain.  She has no nausea or vomiting.  She is taking her medication twice daily.  She do not have any dark or tarry stools.  She had a previous colonoscopy she believes at the age of 65 and thinks it was done by [facility] we do not have access to that record.  The patient labs are consistent with anemia.  She has not had any further work-up for her anemia and her blood counts have not been rechecked in 2 weeks.  Patient is not taking any nonsteroidals or BC/Goody powders currently.  She is now currently off all opioids as well and not taking any pain medications.  The patient has been taking MiraLAX to make sure her bowels are moving but now that she is off opioid therapy she is wanting to transition to stool softeners.  She just bought a stool softener that also has a laxative with it.  GI Review of Systems Positive as above Negative for dysphagia, odynophagia, coffee-ground emesis, hematemesis, change in bowel habits (did have issues of diarrhea during her hospitalization which were evaluated with stool cultures), melena, hematochezia  Review of Systems General: Denies fevers/chills/current weight loss HEENT: Denies oral lesions Cardiovascular: Denies chest pain Pulmonary: Denies shortness of breath/nocturnal cough Gastroenterological: See HPI Genitourinary: Denies darkened urine or hematuria Hematological:  Denies easy bruising/bleeding Endocrine: Denies temperature intolerance Dermatological: Denies jaundice Psychological: Mood is stable   Medications Current Outpatient Medications  Medication Sig Dispense Refill  . ALPRAZolam (XANAX) 0.5 MG tablet Take 1  tablet (0.5 mg total) by mouth at bedtime as needed for anxiety. 30 tablet 0  . docusate sodium (COLACE) 50 MG capsule Take 50 mg by mouth 2 (two) times daily.    . nicotine (NICODERM CQ - DOSED IN MG/24 HOURS) 14 mg/24hr patch Place 1 patch (14 mg total) onto the skin daily. 28 patch 0  . sucralfate (CARAFATE) 1 GM/10ML suspension Take 10 mLs (1 g total) by mouth 4 (four) times daily -  with meals and at bedtime. 420 mL 0  . pantoprazole (PROTONIX) 40 MG tablet Take 1 tablet (40 mg total) by mouth 2 (two) times daily. 60 tablet 2  . potassium chloride (K-DUR) 10 MEQ tablet Take 1 tablet (10 mEq total) by mouth 3 (three) times daily. 20 tablet 0   No current facility-administered medications for this visit.     Allergies Allergies  Allergen Reactions  . Flexeril [Cyclobenzaprine] Anaphylaxis  . Hydrocodone Anaphylaxis    Histories Past Medical History:  Diagnosis Date  . Depression   . Genital warts   . Heart murmur   . Kidney stones   . Sepsis (HCC) 2020   Past Surgical History:  Procedure Laterality Date  . CESAREAN SECTION  1989   Twins- boys  . ESOPHAGOGASTRODUODENOSCOPY (EGD) WITH PROPOFOL N/A 02/05/2019   Procedure: ESOPHAGOGASTRODUODENOSCOPY (EGD) WITH PROPOFOL;  Surgeon: Charlott RakesSchooler, Vincent, MD;  Location[facility];  Service: Endoscopy;  Laterality: N/A;   Social History   Socioeconomic History  . Marital status: Divorced    Spouse name: Not on file  . Number of children: 2  . Years of education: Not on file  . Highest education level: Not on file  Occupational History  . 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 Financial resource strain: Not on file  . Food insecurity:    Worry: Not on file    Inability: Not on file  . Transportation needs:    Medical: Not on file    Non-medical: Not on file  Tobacco Use  . Smoking status: Former Smoker    Packs/day: 1.00    Years: 40.00     Pack years: 40.00    Types: Cigarettes    Last attempt to quit: 02/02/2019    Years since quitting: 0.0  . Smokeless tobacco: Never Used  Substance and Sexual Activity  . Alcohol use: No  . Drug use: No  . Sexual activity: Not on file  Lifestyle  . Physical activity:    Days per week: Not on file    Minutes per session: Not on file  . Stress: Not on file  Relationships  . Social connections:    Talks on phone: Not on file    Gets together: Not on file    Attends religious service: Not on file    Active member of club or organization: Not on file    Attends meetings of clubs or organizations: Not on file    Relationship status: Not on file  . Intimate partner violence:    Fear of current or ex partner: Not on file    Emotionally abused: Not on file    Physically abused: Not on file    Forced sexual activity:  Not on file  Other Topics Concern  . Not on file  Social History Narrative   Twin sons that live nearby and are very supportive. Ex-husband lives in [place]. Good relationship.       Tobacco user (began at age 68, smokes 1 pack/day).  Additionally she also uses occasional marijuana.  No IV drug abuse or narcotics. STOPPED SMOKING 2/20 DURING HOSPITALIZATION.    Family History  Problem Relation Age of Onset  . Alzheimer's disease Mother   . Liver disease Father   . Colon cancer Neg Hx   . Esophageal cancer Neg Hx   . Inflammatory bowel disease Neg Hx   . Pancreatic cancer Neg Hx   . Rectal cancer Neg Hx   . Stomach cancer Neg Hx    I have reviewed her medical, social, and family history in detail and updated the electronic medical record as necessary.    PHYSICAL EXAMINATION  BP 100/60   Pulse 68   Temp 98.2 F (36.8 C)   Ht 5\' 5"  (1.651 m)   Wt 111 lb (50.3 kg)   BMI 18.47 kg/m  Wt Readings from Last 3 Encounters:  02/18/19 111 lb (50.3 kg)  02/13/19 117 lb 12.8 oz (53.4 kg)  02/11/19 120 lb 12.8 oz (54.8 kg)  GEN: NAD, appears stated age, doesn't  appear chronically ill, accompanied by son, and she is wearing a mask to protect herself against any infections PSYCH: Cooperative, without pressured speech EYE: Conjunctivae pink, sclerae anicteric ENT: MMM, without oral ulcers, no erythema or exudates noted NECK: Supple CV: RR without R/Gs  RESP: CTAB posteriorly, without wheezing GI: NABS, soft, NT/ND, without rebound or guarding, no HSM appreciated GU: DRE shows MSK/EXT: No lower extremity edema SKIN: No jaundice, no spider angiomata NEURO:  Alert & Oriented x 3, no focal deficits, no evidence of asterixis   REVIEW OF DATA  I reviewed the following data at the time of this encounter:  GI Procedures and Studies  02/05/2019 EGD - LA Grade D esophagitis. - Z-line, 42 cm from the incisors. - Congested, erythematous, inflamed and ulcerated mucosa in the prepyloric region of the stomach and pylorus. - Non-bleeding gastric ulcers with no stigmata of bleeding. - Mucosal changes in the duodenum. - No specimens collected. - Diffuse ulcers in examined duodenum; pyloric channel and esophagus concerning for an ischemic process and will need further evaluation of that with a CT angiogram. Will discuss with critical care team.  Laboratory Studies  Reviewed in epic  Imaging Studies  CT abdomen pelvis angiography IMPRESSION: VASCULAR 1. No arterial or venous thrombosis within the abdomen or pelvis.  NON-VASCULAR 1. Overall abnormal left nephrogram with wedge-shaped areas of low density in the left upper pole of the kidney with small amount of perinephric fluid and hemorrhage. Differential considerations include pyelonephritis from typical etiology or atypical etiologies such as TB versus infiltrating lesions such as lymphoma versus less likely renal infarct. This appearance can also be seen in the setting of trauma, but no apparent history of trauma is noted within the chart. 2. Bilateral moderate hydroureteronephrosis likely  resulting from severe bladder distension or vesicoureteral reflux. 3. Small left pleural effusion.  Bibasilar atelectasis.   ASSESSMENT  Ms. Szczygiel is a 58 y.o. female with a pmh significant for depression, nephrolithiasis, esophagitis, gastritis, PUD, gastric ulcers, recent urosepsis.  The patient is seen today for evaluation and management of:  1. Acute esophagitis   2. PUD (peptic ulcer disease)   3. Anemia, unspecified type  4. Abnormal LFTs   5. History NSAID acute use    The patient is hemodynamically and clinically stable at this point in time.  Her initial presentation is interesting in the setting of such significant esophagitis/gastritis/gastric ulcer/duodenitis that agreed that working up ischemia or an underlying insult seems the most reasonable.  Thankfully, she did not have any evidence of that on cross-sectional imaging.  The patient is doing very well currently.  I am concerned however, that she is at very high risk of having significant stricturing disease if she misses any of her acid reducing medications.  She is not taking any nonsteroidals and has already been told by her primary care provider to try and minimize the use of that as well.  Interesting to have GI use and duodenitis and significant esophagitis that one would query the possibility of Zollinger-Ellison however we cannot test her for that currently because she is already on PPI therapy.  She also was never tested for evaluation of H. pylori and thus although the sensitivity is lower for patient when we do a blood antibody evaluation I think it is worthwhile that if it does come back positive that we should go ahead and treat that rather than waiting another month to month and a half before we relook and take biopsies.  She is positive on her H. pylori antibody and then we should move forward with quadruple therapy for her.  She will maintain twice daily PPI therapy for now.  She has no dysphagia or odynophagia and thus  I think it is reasonable to wait for a total of 2 to 2-1/2 months before repeat endoscopy unless she has any symptoms redevelop.  Her liver test abnormalities are unclear and also only seen in the setting of her significant urosepsis.  I like to see how things go and likely repeat those in the future set of laboratories that can be done next month.  She is anemic and although not microcytic I think it is worthwhile for [place] to do an anemia panel and we will send off a blood count as well as B12/folate/iron indices to further evaluate her anemia as she describes not having any overt issues of GI blood loss during this whole period in time.  The patient states that she had a colonoscopy within the last 10 years, likely at [facility] 7 years ago when she had turned 50 and we will work to try and get those records.  The risks and benefits of endoscopic evaluation were discussed with the patient; these include but are not limited to the risk of perforation, infection, bleeding, missed lesions, lack of diagnosis, severe illness requiring hospitalization, as well as anesthesia and sedation related illnesses.  The patient is agreeable to proceed.  All patient questions were answered, to the best of my ability, and the patient agrees to the aforementioned plan of action with follow-up as indicated.   PLAN  Laboratories as outlined below to be done in approximately 2 to 3 weeks (will be future orders) If H. pylori antigen is positive then we will treat with quadruple therapy due to high concern for possibility of H. pylori infection which was not evaluated via biopsy due to thrombocytopenia at time of endoscopy Follow-up iron studies and anemia panel and if deficient will need supplementation Would repeat endoscopy in 2 months time to allow healing of esophagitis and gastric ulcers and duodenal inflammation and ensure that there is no mass or lesion Hold on ZE syndrome testing as  gastrin likely would be elevated as a  result of PPI therapy Minimize any nonsteroidal use moving forward Protonix 40 twice daily to maintain until repeat endoscopy performed Try to get colonoscopy report and ensure that there is no need for repeat colonoscopy Okay for patient to stop using MiraLAX now that opioids are done and start using Colace 100 mg twice daily (do not use Colace with laxative unless she is not having bowel movement and I would rather her be back on MiraLAX)   Orders Placed This Encounter  Procedures  . CBC  . IBC panel  . Ferritin  . Reticulocytes  . B12  . Folate  . H. pylori antibody, IgG    New Prescriptions   PANTOPRAZOLE (PROTONIX) 40 MG TABLET    Take 1 tablet (40 mg total) by mouth 2 (two) times daily.   Modified Medications   Modified Medication Previous Medication   POTASSIUM CHLORIDE (K-DUR) 10 MEQ TABLET potassium chloride (K-DUR) 10 MEQ tablet      Take 1 tablet (10 mEq total) by mouth 3 (three) times daily.    Take 1 tablet (10 mEq total) by mouth 3 (three) times daily.    Planned Follow Up: No follow-ups on file.   Corliss ParishGabriel Mansouraty, MD [facility] Office # [phone]

## 2019-02-19 ENCOUNTER — Other Ambulatory Visit: Payer: Self-pay

## 2019-02-19 MED ORDER — POTASSIUM CHLORIDE ER 10 MEQ PO TBCR: 10.0000 meq | EXTENDED_RELEASE_TABLET | Freq: Three times a day (TID) | ORAL | 0 refills | Status: DC

## 2019-02-19 NOTE — Telephone Encounter (Signed)
Pt would like a call from [facility] regarding some of below items. Pt also states she is unsure if she should keep her CT appt for tomorrow but would like to see if Dr. Earlene Plater is in agreeance. She needs to as soon as possible so that she may cancel the appt today. Please advise.

## 2019-02-19 NOTE — Telephone Encounter (Signed)
See note

## 2019-02-19 NOTE — Telephone Encounter (Signed)
Called patient reviewed information lab app made and refill called in she is to take until her app for labs on Monday

## 2019-02-19 NOTE — Telephone Encounter (Signed)
Needs recheck CMP and magnesium to decide on potassium. Okay new referral to GI. See if she wants [facility], [facility], etc. I can actually order the CT chest, no problem.

## 2019-02-19 NOTE — Addendum Note (Signed)
Addended by: Donnamarie Poag on: 02/19/2019 03:25 PM   Modules accepted: Orders

## 2019-02-19 NOTE — Telephone Encounter (Signed)
Called went over several things.  1. She wanted to know if her CT had been approved. Let her know we did not schedule she needs to call the office that ordered to see about that.  2. When she went she did not need to have the thoracentesis fluid was low.  3. She wanted to let you know she still has cough it is better but feels like it is moving lower. Reviewed red words with her she is not having andy fever, chills, nausea or vomiting. Cough is productive and clear. If any changes she will call  4. She was not comfortable with GI doc that she had yesterday. She does want to stay in [place] advised to call office and see about changing to another provider. If she has any problems she will call us back  5. She wants to know if you want her to stay on the potassium? She will need refill soon if so.

## 2019-02-20 ENCOUNTER — Encounter: Payer: Self-pay | Admitting: Physician Assistant

## 2019-02-20 ENCOUNTER — Telehealth: Payer: Self-pay | Admitting: Gastroenterology

## 2019-02-20 ENCOUNTER — Inpatient Hospital Stay: Admission: RE | Admit: 2019-02-20 | Payer: 59 | Source: Ambulatory Visit

## 2019-02-20 DIAGNOSIS — Q210 Ventricular septal defect: Secondary | ICD-10-CM | POA: Insufficient documentation

## 2019-02-20 NOTE — Telephone Encounter (Signed)
Patient says it's nothing personal and is not complaining at all but would rather see a different provider here in the practice.  Do you approve Dr Meridee Score?

## 2019-02-22 DIAGNOSIS — K209 Esophagitis, unspecified without bleeding: Secondary | ICD-10-CM | POA: Insufficient documentation

## 2019-02-22 DIAGNOSIS — Z791 Long term (current) use of non-steroidal anti-inflammatories (NSAID): Secondary | ICD-10-CM | POA: Insufficient documentation

## 2019-02-22 DIAGNOSIS — K279 Peptic ulcer, site unspecified, unspecified as acute or chronic, without hemorrhage or perforation: Secondary | ICD-10-CM | POA: Insufficient documentation

## 2019-02-22 HISTORY — DX: Esophagitis, unspecified without bleeding: K20.90

## 2019-02-22 NOTE — Telephone Encounter (Signed)
Sarah Fry, I have no qualms with a patient being able to be seen by a provider that she feels comfortable with.  I give permission for her to be seen by whomever she elects if that other provider agrees as well. Thank you. GM

## 2019-02-23 ENCOUNTER — Other Ambulatory Visit (INDEPENDENT_AMBULATORY_CARE_PROVIDER_SITE_OTHER): Payer: 59

## 2019-02-23 ENCOUNTER — Encounter: Payer: Self-pay | Admitting: Family Medicine

## 2019-02-23 DIAGNOSIS — E876 Hypokalemia: Secondary | ICD-10-CM

## 2019-02-23 LAB — MAGNESIUM: Magnesium: 1.8 mg/dL (ref 1.5–2.5)

## 2019-02-23 LAB — COMPREHENSIVE METABOLIC PANEL
ALT: 9 U/L (ref 0–35)
AST: 15 U/L (ref 0–37)
Albumin: 3.7 g/dL (ref 3.5–5.2)
Alkaline Phosphatase: 94 U/L (ref 39–117)
BUN: 11 mg/dL (ref 6–23)
CO2: 28 mEq/L (ref 19–32)
Calcium: 8.8 mg/dL (ref 8.4–10.5)
Chloride: 104 mEq/L (ref 96–112)
Creatinine, Ser: 0.68 mg/dL (ref 0.40–1.20)
GFR: 89.07 mL/min (ref >60.00)
Glucose, Bld: 84 mg/dL (ref 70–99)
Potassium: 4.2 mEq/L (ref 3.5–5.1)
Sodium: 140 mEq/L (ref 135–145)
Total Bilirubin: 0.3 mg/dL (ref 0.2–1.2)
Total Protein: 6.7 g/dL (ref 6.0–8.3)

## 2019-02-23 NOTE — Telephone Encounter (Signed)
Dr. Meridee Score' work-up, just a few days ago, was incredibly thorough.  I recommend that she continue care under his direction.  Thanks

## 2019-02-23 NOTE — Telephone Encounter (Signed)
Dr. Marina Goodell,   Patients wants to transfer care from Dr. Meridee Score because she has researched the practice and would like to see a physician that has been with the practice longer. Do you accept?

## 2019-02-24 ENCOUNTER — Other Ambulatory Visit: Payer: Self-pay

## 2019-02-24 MED ORDER — NICOTINE 7 MG/24HR TD PT24: 7.0000 mg | MEDICATED_PATCH | Freq: Every day | TRANSDERMAL | 0 refills | Status: DC

## 2019-02-24 NOTE — Telephone Encounter (Signed)
Noted. I am happy to continue to be her provider. However, she has ultimate decision, and if she elects to want to seek care with another provider and that other provider agrees to transfer of care then I am OK with this as well. Thanks. GM

## 2019-02-24 NOTE — Progress Notes (Signed)
nic

## 2019-03-02 ENCOUNTER — Ambulatory Visit: Payer: BLUE CROSS/BLUE SHIELD | Admitting: Family Medicine

## 2019-03-03 NOTE — Telephone Encounter (Signed)
Dr. Meridee Score performed a very thoughtful evaluation. I agree with his recommendations for labs. I would not wait to have them performed. Thanks.

## 2019-03-03 NOTE — Telephone Encounter (Signed)
Thank you Dr. Orvan Falconer, she is scheduled on 3/31 with you. There are some blood work ordered for her, I told her to hold on until she sees you. Thank you.

## 2019-03-03 NOTE — Telephone Encounter (Signed)
I called her Dr. Orvan Falconer, she will have labs done before your ov with you. Thank you.

## 2019-03-03 NOTE — Telephone Encounter (Signed)
Hi Dr. Orvan Falconer, this pt would like to switch her care over to you. Please see the messages below. Pt stated that initially she was looking for a provider that had been with the practice for long time. However, she looked you up online and changed her mind, she would like to be your patient. Is it ok to schedule an appt with you? Thank you.

## 2019-03-03 NOTE — Telephone Encounter (Signed)
That is fine. Please schedule a 30 minute appointment with me. Thanks.

## 2019-03-04 ENCOUNTER — Other Ambulatory Visit (INDEPENDENT_AMBULATORY_CARE_PROVIDER_SITE_OTHER): Payer: 59

## 2019-03-04 DIAGNOSIS — D649 Anemia, unspecified: Secondary | ICD-10-CM | POA: Diagnosis not present

## 2019-03-04 DIAGNOSIS — K209 Esophagitis, unspecified without bleeding: Secondary | ICD-10-CM

## 2019-03-04 DIAGNOSIS — K279 Peptic ulcer, site unspecified, unspecified as acute or chronic, without hemorrhage or perforation: Secondary | ICD-10-CM | POA: Diagnosis not present

## 2019-03-04 LAB — H. PYLORI ANTIBODY, IGG: H Pylori IgG: NEGATIVE

## 2019-03-04 LAB — CBC
HEMATOCRIT: 33.1 % — AB (ref 36.0–46.0)
Hemoglobin: 11.3 g/dL — ABNORMAL LOW (ref 12.0–15.0)
MCHC: 34.2 g/dL (ref 30.0–36.0)
MCV: 88.7 fl (ref 78.0–100.0)
Platelets: 284 10*3/uL (ref 150.0–400.0)
RBC: 3.73 Mil/uL — ABNORMAL LOW (ref 3.87–5.11)
RDW: 15.2 % (ref 11.5–15.5)
WBC: 7.5 10*3/uL (ref 4.0–10.5)

## 2019-03-04 LAB — FOLATE

## 2019-03-04 LAB — RETICULOCYTES
ABS Retic: 91000 cells/uL — ABNORMAL HIGH (ref 20000–8000)
Retic Ct Pct: 2.5 %

## 2019-03-04 LAB — FERRITIN: Ferritin: 188.8 ng/mL (ref 10.0–291.0)

## 2019-03-04 LAB — IBC PANEL
Iron: 79 ug/dL (ref 42–145)
Saturation Ratios: 24.4 % (ref 20.0–50.0)
Transferrin: 231 mg/dL (ref 212.0–360.0)

## 2019-03-04 LAB — VITAMIN B12: VITAMIN B 12: 484 pg/mL (ref 211–911)

## 2019-03-10 ENCOUNTER — Encounter: Payer: Self-pay | Admitting: Pulmonary Disease

## 2019-03-10 ENCOUNTER — Ambulatory Visit: Payer: 59 | Admitting: Pulmonary Disease

## 2019-03-10 ENCOUNTER — Ambulatory Visit (INDEPENDENT_AMBULATORY_CARE_PROVIDER_SITE_OTHER)
Admission: RE | Admit: 2019-03-10 | Discharge: 2019-03-10 | Disposition: A | Discharge status: Home / Self Care | Payer: 59 | Source: Ambulatory Visit | Attending: Pulmonary Disease | Admitting: Pulmonary Disease

## 2019-03-10 VITALS — BP 118/64 | HR 70 | Ht 65.0 in | Wt 115.8 lb

## 2019-03-10 DIAGNOSIS — R0600 Dyspnea, unspecified: Secondary | ICD-10-CM | POA: Diagnosis not present

## 2019-03-10 NOTE — Patient Instructions (Signed)
Glad you are doing well.   We will get a chest x-ray today  Follow-up in 6 months.

## 2019-03-10 NOTE — Progress Notes (Signed)
Sarah Fry    [id number]    09/29/1961  Primary Care Physician:Wallace, Alcario Drought, DO  Referring Physician: Helane Rima, DO [address] [place], [place] [address]  Chief complaint:  Follow up for left pleural effusion.   HPI: 58 year old smoker with past medical history of nephrolithiasis  Admitted to [facility] from 2/3-2/9 with generalized edema, swollen tongue after recent trip to [place] with her sons.  Noted to have sepsis with acute kidney injury with proteinuria, low platelets of unclear etiology.  Initial concern was for thrombotic microangiopathy such as TTP/HUS however she did not have any evidence of hemolysis with no schistocytes, normal haptoglobin.  There is no evidence of DIC.  She eventually grew out Citrobacter koseri in her blood which is thought to be from urinary source with a positive UA.  Noted to have prior urine cultures with the same organism.  Urine cultures on this admission were negative however they were drawn after several days of antibiotic therapy.  Also noted to have esophageal thickening on CT scan.  She underwent EGD which showed diffuse ulceration from the esophagus down to the duodenum, possible ischemic changes however a CTA did not show any arterial or venous vascular obstruction.  Supportive care recommended by GI.  She was also seen by oral surgery for tongue ulcerations and viscous lidocaine recommended.  Other consults on this admission included nephrology, hematology.  She has followed up with her primary care with a repeat x-ray showing increasing opacities at the left base with possible effusion.  Of note lung imaging during the hospitalization showed bibasal atelectasis with small pleural effusions.  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  Quit last month Travel history: Several trips to [place] over  the past year Relevant family history: No significant family history of lung disease.  Interim history: She was sent for an ultrasound guided thoracentesis of left effusion based on chest x-ray which showed worsening left base opacity.  There is no pocket to drain  States that she is doing very well.  No cough, of sputum production, fevers, chills. She is back to doing her normal activities of daily living without issue.  Outpatient Encounter Medications as of 03/10/2019  Medication Sig  . ALPRAZolam (XANAX) 0.5 MG tablet Take 1 tablet (0.5 mg total) by mouth at bedtime as needed for anxiety.  . docusate sodium (COLACE) 100 MG capsule Take 100 mg by mouth 2 (two) times daily.  . Multiple Vitamins-Minerals (CENTRUM PO) Take 1 tablet by mouth daily.  . pantoprazole (PROTONIX) 40 MG tablet Take 1 tablet (40 mg total) by mouth 2 (two) times daily.  . [DISCONTINUED] docusate sodium (COLACE) 50 MG capsule Take 50 mg by mouth 2 (two) times daily.  . [DISCONTINUED] nicotine (NICODERM CQ) 7 mg/24hr patch Place 1 patch (7 mg total) onto the skin daily.  . [DISCONTINUED] potassium chloride (K-DUR) 10 MEQ tablet Take 1 tablet (10 mEq total) by mouth 3 (three) times daily.  . [DISCONTINUED] sucralfate (CARAFATE) 1 GM/10ML suspension Take 10 mLs (1 g total) by mouth 4 (four) times daily -  with meals and at bedtime.   No facility-administered encounter medications on file as of 03/10/2019.    Physical Exam: Blood pressure 118/64, pulse 70, height 5\' 5"  (1.651 m), weight 115 lb 12.8 oz (52.5 kg), SpO2 98 %. Gen:      No acute  distress HEENT:  EOMI, sclera anicteric Neck:     No masses; no thyromegaly Lungs:    Clear to auscultation bilaterally; normal respiratory effort CV:         Regular rate and rhythm; no murmurs Abd:      + bowel sounds; soft, non-tender; no palpable masses, no distension Ext:    No edema; adequate peripheral perfusion Skin:      Warm and dry; no rash Neuro: alert and oriented x  3 Psych: normal mood and affect  Data Reviewed: Imaging: CT Chest / ABD / Pelvis 2/3 >> no cardiomegaly, mild aortic calcifications, small 64mm lung nodule RLL, C  T and L spine degenerative changes, no gallstones, mild gallbladder dilation, small L renal calculus, no hydronephrosis, wall thickening of esophageal ampulla   UE Venous Duplex upper and lower 2/3, 2/6 >> negative   MRV non-con 2/3>> no flow related signal in L transverse and sigmoid venous sinuses. Read as likely anatomical variant   CT soft tissue neck 2/6>> right periorbital edema, small left pleural effusion.  CT abdpelvis with contrast 2/6 >> no arterial or venous thrombosis within abdomen or pelvis.  Wedge-shaped area of low density in the left upper kidney with small amount of perinephric fluid and hemorrhage.  Bilateral moderate hydro-ureteronephrosis. Bi basal atelectasis with small left effusion.  Assessment:  Evaluation for pleural effusion Patient had a recent hospitalization with Citrobacter septicemia associated with acute kidney injury, thrombocytopenia.    Eval in clinic for left pleural opacity with possible effusion.  On ultrasound evaluation there was minimal fluid to drain. I do not feel any intervention is needed at this point.  We will get an x-ray to make sure the opacity is improving.  Plan/Recommendations: - CXR  Chilton Greathouse MD [facility] 03/10/2019, 2:19 PM  CC: Helane Rima, DO

## 2019-03-13 ENCOUNTER — Encounter: Payer: Self-pay | Admitting: *Deleted

## 2019-03-16 ENCOUNTER — Encounter: Payer: Self-pay | Admitting: Family Medicine

## 2019-03-17 ENCOUNTER — Telehealth: Payer: Self-pay | Admitting: *Deleted

## 2019-03-17 NOTE — Telephone Encounter (Signed)
Per pre-screening review of chart for visit tomorrow- MD reviewed labs - no need for follow up visit at this time.  This RN called and informed pt of above as well as clarifying with her reason for visit was to follow up on low platelet count - not a cancer diagnosis.

## 2019-03-17 NOTE — Progress Notes (Deleted)
I saw this patient in the hospital for evaluation of thrombocytopenia.  This has completely resolved.  She is canceling the appointment.  We are not rescheduling but will be glad to see her at any point in the future of course on an as-needed basis.

## 2019-03-18 ENCOUNTER — Other Ambulatory Visit: Payer: 59

## 2019-03-18 ENCOUNTER — Ambulatory Visit: Payer: 59 | Admitting: Oncology

## 2019-03-31 ENCOUNTER — Ambulatory Visit (INDEPENDENT_AMBULATORY_CARE_PROVIDER_SITE_OTHER): Payer: 59 | Admitting: Gastroenterology

## 2019-03-31 ENCOUNTER — Other Ambulatory Visit: Payer: Self-pay

## 2019-03-31 DIAGNOSIS — K209 Esophagitis, unspecified without bleeding: Secondary | ICD-10-CM

## 2019-03-31 DIAGNOSIS — K279 Peptic ulcer, site unspecified, unspecified as acute or chronic, without hemorrhage or perforation: Secondary | ICD-10-CM | POA: Diagnosis not present

## 2019-03-31 DIAGNOSIS — Z86010 Personal history of colonic polyps: Secondary | ICD-10-CM | POA: Diagnosis not present

## 2019-03-31 NOTE — Progress Notes (Addendum)
TELEHEALTH VISIT  Referring Provider: Helane Rima, DO Primary Care Physician:  Helane Rima, DO   Tele-visit due to COVID-19 pandemic Patient requested visit virtually, consented to the virtual encounter via WebEx Contact made at: 13:45 03/31/19 Patient verified by name and date of birth Location of patient: Home Location provider: My medical office Names of persons participating: Me, patient Time spent on telehealth visit: 30 minutes  Chief complaint: Ulcers   IMPRESSION:  LA class D esophagitis identified by Dr. Bosie Clos 02/05/19 during a recent hospitalization Peptic ulcer disease with concerns for possible underlying ischemia    -Gastric ulcers and diffuse duodenal ulcers concerning for possible ischemia    -CT angiogram of the abdomen and pelvis was unremarkable    -Vascular surgery consult was obtained    - H. Pylori IgG antibody -03/04/2019 Normocytic anemia    -Hemoglobin nadir of 9.6 on 02/11/2019 while hospitalized with acute symptoms    -Hemoglobin 11.3 03/04/2019 Abnormal liver enzymes while hospitalized in February, normal on repeat testing 02/23/2019 History of acute NSAID use History of colon polyps    -Colonoscopy with Dr. Danise Edge 02/08/2012: 2 tubular adenomas, one greater than 10 mm, surveillance recommended in 2016 No known family history of colon cancer or polyps  PLAN: Surveillance colonoscopy is overdue, will schedule colonoscopy when COVID-19 restrictions are lifted I recommend a repeat upper endoscopy at that time to reassess the duodenal ulcers Continue to avoid all NSAIDs Consider screening for Zollinger-Ellison Ellison syndrome if peptic ulcer disease persists  I consented the patient discussing the risks, benefits, and alternatives to endoscopic evaluation. In particular, we discussed the risks that include, but are not limited to, reaction to medication, cardiopulmonary compromise, bleeding requiring blood transfusion, aspiration resulting in  pneumonia, perforation requiring surgery, lack of diagnosis, severe illness requiring hospitalization, and even death. We reviewed the risk of missed lesion including polyps or even cancer. The patient acknowledges these risks and asks that we proceed.  HPI: Sarah Fry is a 58 y.o. female checking in after her recent upper endoscopy.  She is recently been seen by Dr. Meridee Score.  I reviewed her electronic record to create the problem list above including labs, imaging, and procedure notes. .  The interval history is obtained through the patient.    She is overall feeling better.  Her appetite has returned.  She is eating normally.  No heartburn, nausea, vomiting, dysphagia, odynophagia, or abdominal pain.  She quit smoking.  She is not using any NSAIDs.  She reports no ongoing GI problems at this time.  Reviewed records from Dr. Danise Edge showing 2 tubular adenomas removed on a colonoscopy 02/08/2012.  One polyp was larger than 10 mm.  He recommended surveillance colonoscopy in 3 years.  No known family history of colon cancer or polyps. No family history of uterine/endometrial cancer, pancreatic cancer or gastric/stomach cancer.  Past Medical History:  Diagnosis Date  . Depression   . Genital warts   . Heart murmur   . Kidney stones   . Sepsis (HCC) 2020    Past Surgical History:  Procedure Laterality Date  . CESAREAN SECTION  1989   Twins- boys  . ESOPHAGOGASTRODUODENOSCOPY (EGD) WITH PROPOFOL N/A 02/05/2019   Procedure: ESOPHAGOGASTRODUODENOSCOPY (EGD) WITH PROPOFOL;  Surgeon: Charlott Rakes, MD;  Location: [facility] ENDOSCOPY;  Service: Endoscopy;  Laterality: N/A;    Current Outpatient Medications  Medication Sig Dispense Refill  . ALPRAZolam (XANAX) 0.5 MG tablet Take 1 tablet (0.5 mg total) by mouth at bedtime as  needed for anxiety. 30 tablet 0  . docusate sodium (COLACE) 100 MG capsule Take 100 mg by mouth 2 (two) times daily.    . Multiple Vitamins-Minerals (CENTRUM PO)  Take 1 tablet by mouth daily.    . pantoprazole (PROTONIX) 40 MG tablet Take 1 tablet (40 mg total) by mouth 2 (two) times daily. 60 tablet 2   No current facility-administered medications for this visit.     Allergies as of 03/31/2019 - Review Complete 03/10/2019  Allergen Reaction Noted  . Flexeril [cyclobenzaprine] Anaphylaxis 02/02/2019  . Hydrocodone Anaphylaxis 02/11/2019    Family History  Problem Relation Age of Onset  . Alzheimer's disease Mother   . Liver disease Father   . Colon cancer Neg Hx   . Esophageal cancer Neg Hx   . Inflammatory bowel disease Neg Hx   . Pancreatic cancer Neg Hx   . Rectal cancer Neg Hx   . Stomach cancer Neg Hx     Social History   Socioeconomic History  . Marital status: Divorced    Spouse name: Not on file  . Number of children: 2  . Years of education: Not on file  . Highest education level: Not on file  Occupational History  . 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 Financial resource strain: Not on file  . Food insecurity:    Worry: Not on file    Inability: Not on file  . Transportation needs:    Medical: Not on file    Non-medical: Not on file  Tobacco Use  . Smoking status: Former Smoker    Packs/day: 1.00    Years: 40.00    Pack years: 40.00    Types: Cigarettes    Last attempt to quit: 02/02/2019    Years since quitting: 0.1  . Smokeless tobacco: Never Used  Substance and Sexual Activity  . Alcohol use: No  . Drug use: No  . Sexual activity: Not on file  Lifestyle  . Physical activity:    Days per week: Not on file    Minutes per session: Not on file  . Stress: Not on file  Relationships  . Social connections:    Talks on phone: Not on file    Gets together: Not on file    Attends religious service: Not on file    Active member of club or organization: Not on file    Attends meetings of clubs or organizations: Not on file     Relationship status: Not on file  . Intimate partner violence:    Fear of current or ex partner: Not on file    Emotionally abused: Not on file    Physically abused: Not on file    Forced sexual activity: Not on file  Other Topics Concern  . Not on file  Social History Narrative   Twin sons that live nearby and are very supportive. Ex-husband lives in [place]. Good relationship.       Tobacco user (began at age 67, smokes 1 pack/day).  Additionally she also uses occasional marijuana.  No IV drug abuse or narcotics. STOPPED SMOKING 2/20 DURING HOSPITALIZATION.     Review of Systems: ALL ROS discussed and all others negative except listed in HPI.  Physical Exam: General: in no acute distress Neuro: Alert and appropriate Psych: Normal affect and normal insight   Clarrisa Kaylor L. Orvan Falconer, MD, MPH [facility] 04/02/2019, 2:00  PM

## 2019-03-31 NOTE — Patient Instructions (Signed)
I am recommending an EGD and a colonoscopy when the Covid19 restrictions are lifted. We will contact you at that time to schedule them.  Thank you for your patience with me and our technology today! Please stay home, safe, and healthy. I look forward to meeting you in person in the future.

## 2019-04-03 ENCOUNTER — Encounter: Payer: Self-pay | Admitting: Gastroenterology

## 2019-04-18 IMAGING — CT CT NECK W/ CM
3 of 4 series · 13 of 33 positions shown, 16 images · IV contrast (APPLIED)
Comparison: 02/02/2019 MRV of the head.

CLINICAL DATA: 57 y/o  F; right eye swelling.

EXAM:
CT NECK WITH CONTRAST
TECHNIQUE: Multidetector CT imaging of the neck was performed using the
standard protocol following the bolus administration of intravenous
contrast.
CONTRAST:  100mL VBHG85-15W IOPAMIDOL (VBHG85-15W) INJECTION 76%

[Series 7: coronal st · coronal · 0.34mm/px · 3 of 113 slices shown]
[im 23/113  bone]
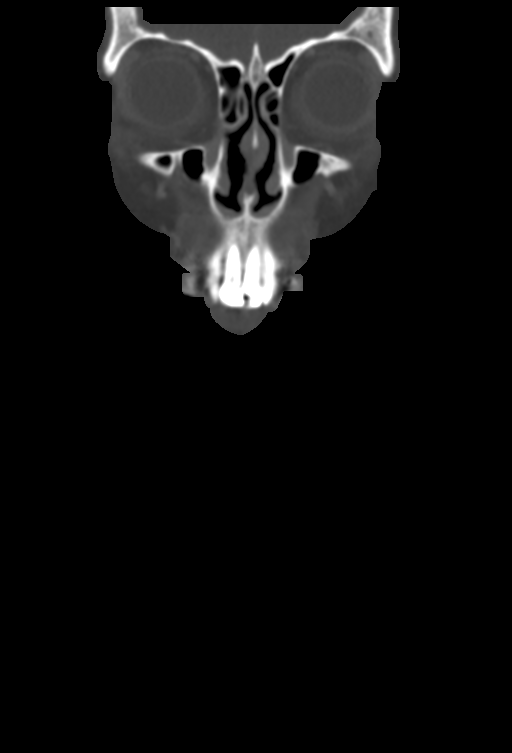
[im 45/113  bone]
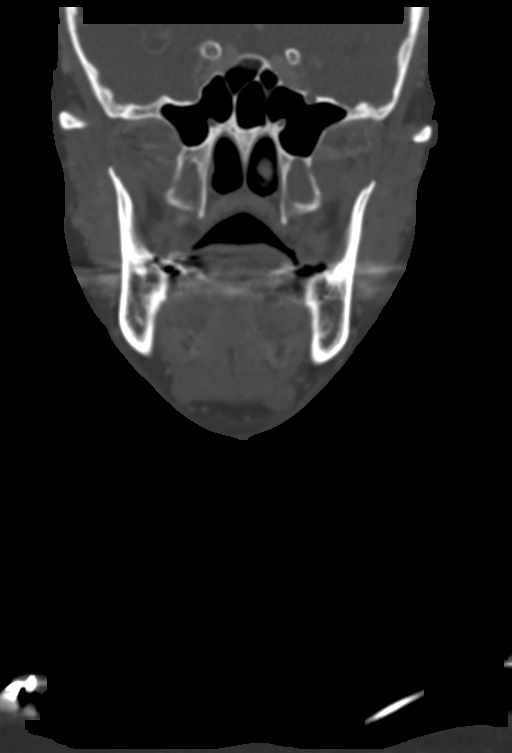
[im 68/113  bone]
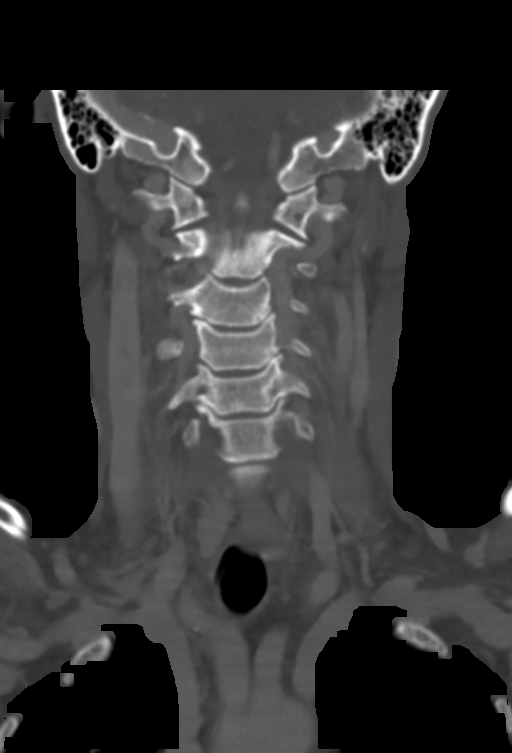

[Series 8: sagittal st · sagittal · 0.49mm/px · 5 of 87 slices shown, 6 images]
[im 29/87  bone]
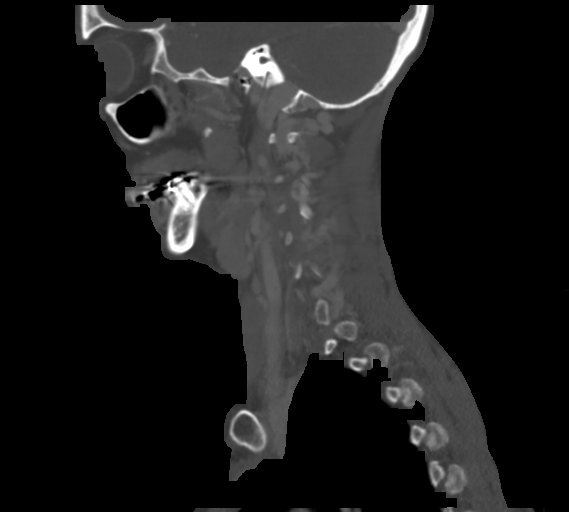
[im 36/87  bone]
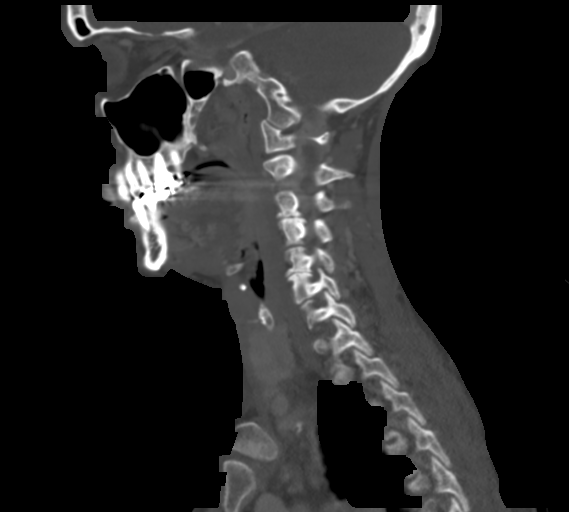
[im 44/87  soft-tissue]
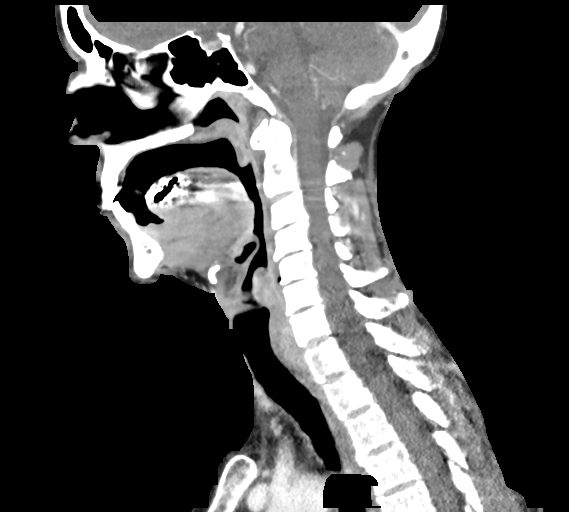
[im 44/87  bone]
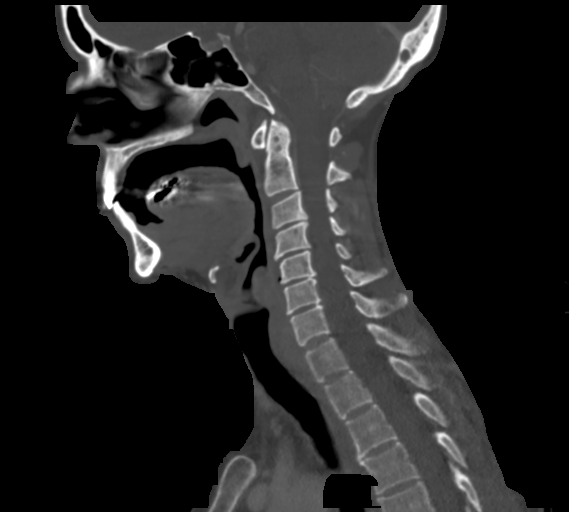
[im 51/87  bone]
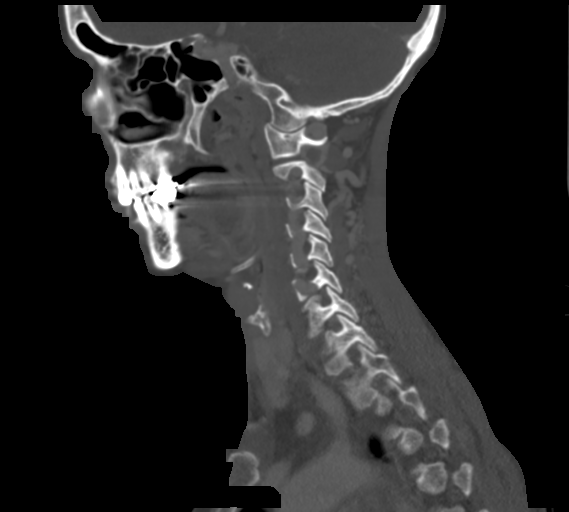
[im 58/87  bone]
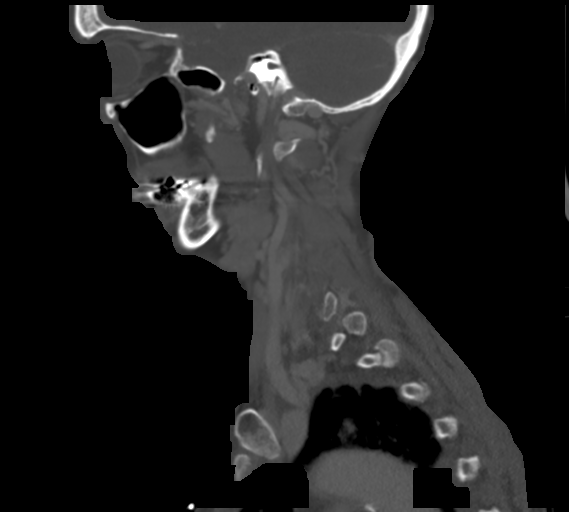

[Series 9: orthogonal st · axial · 0.39mm/px · z∈[-341,-153]mm · 5 of 143 slices shown, 7 images]
[im 24/143  soft-tissue]
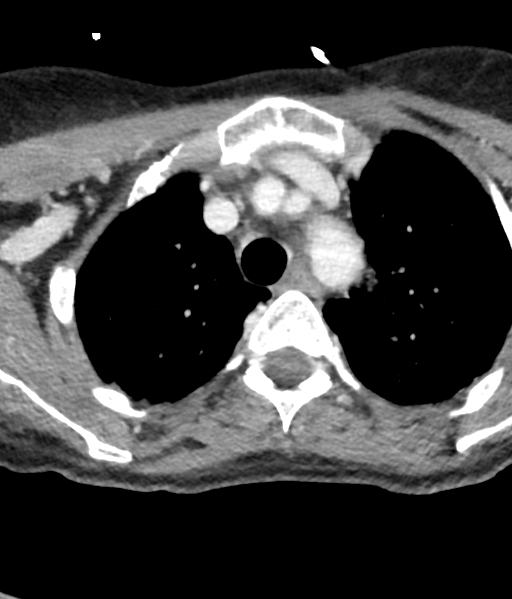
[im 24/143  bone]
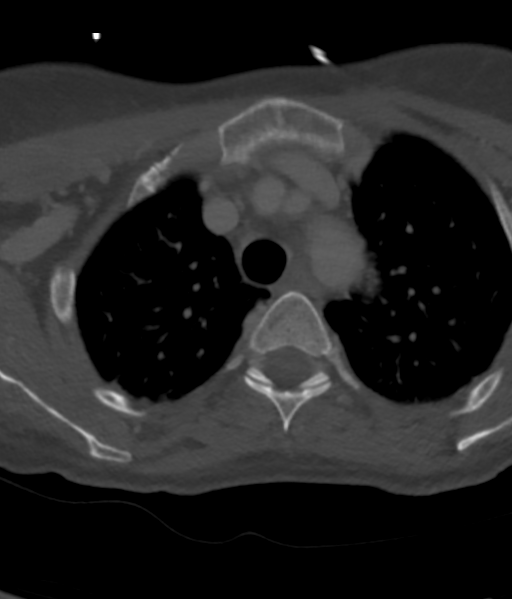
[im 48/143  bone]
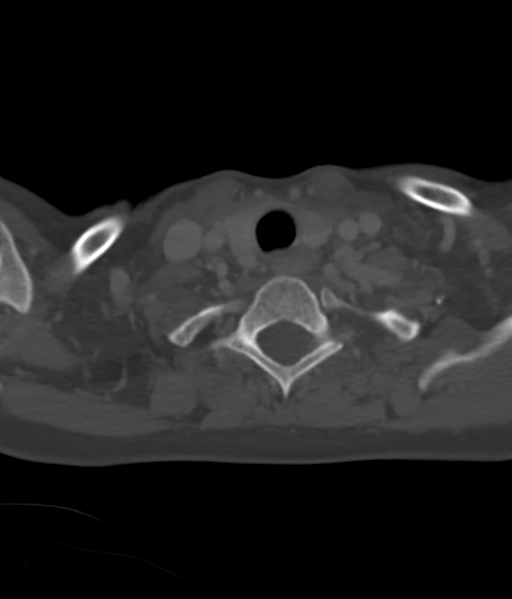
[im 72/143  bone]
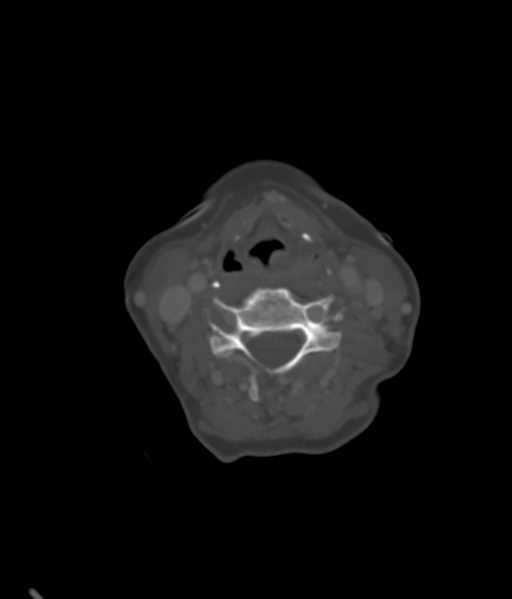
[im 95/143  bone]
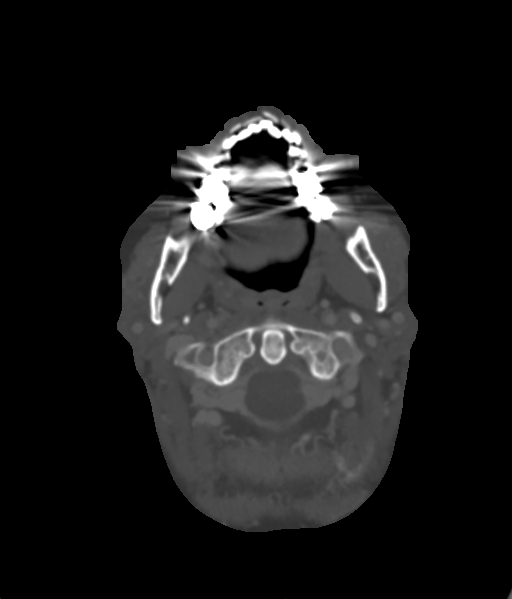
[im 119/143  soft-tissue]
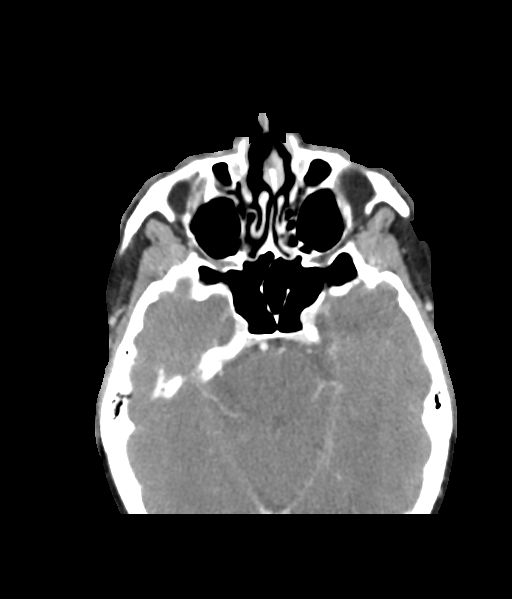
[im 119/143  bone]
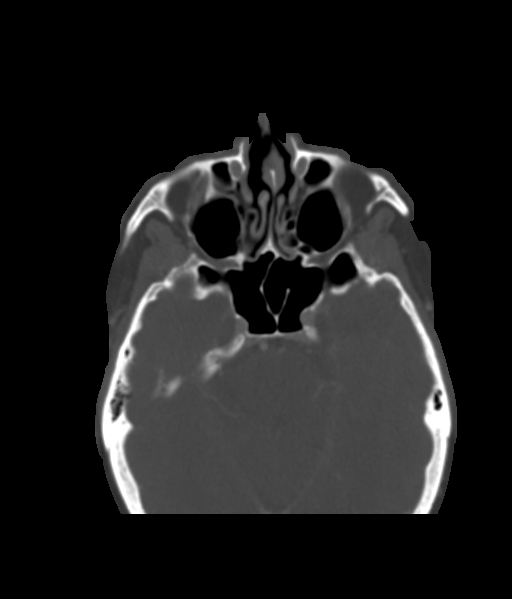

[13 of 33 positions shown; findings below may reference images not displayed]

FINDINGS: Pharynx and larynx: Normal. No mass or swelling.

Salivary glands: No inflammation, mass, or stone.

Thyroid: Normal.

Lymph nodes: None enlarged or abnormal density.

Vascular: Negative. Patent left transverse, sigmoid, internal
jugular venous system, small in caliber. Absent flow related signal
and prior MRV of the head is likely due to relatively lower flow in
the left system in comparison with the right.

Limited intracranial: Negative.

Visualized orbits: No orbital mass or inflammatory process. Edema
within the right periorbital compartment.

Mastoids and visualized paranasal sinuses: Clear.

Skeleton: Mild spondylosis of the cervical spine. No high-grade bony
spinal canal stenosis.

Upper chest: Mild biapical pleuroparenchymal scarring. Small left
pleural effusion.

Other: None.
IMPRESSION: 1. Right periorbital compartment edema, possibly infectious or
inflammatory. No orbital compartment mass or inflammatory process.
No abscess.
2. Small left pleural effusion.
3. Mild spondylosis of the cervical spine.

## 2019-04-24 IMAGING — DX DG CHEST 2V
2 series · 2 of 2 positions shown · non-contrast
Comparison: 02/05/2019 and older exams.

CLINICAL DATA: Back pain for 2 weeks.  Follow-up exam.

EXAM:
CHEST - 2 VIEW

[chest pa]
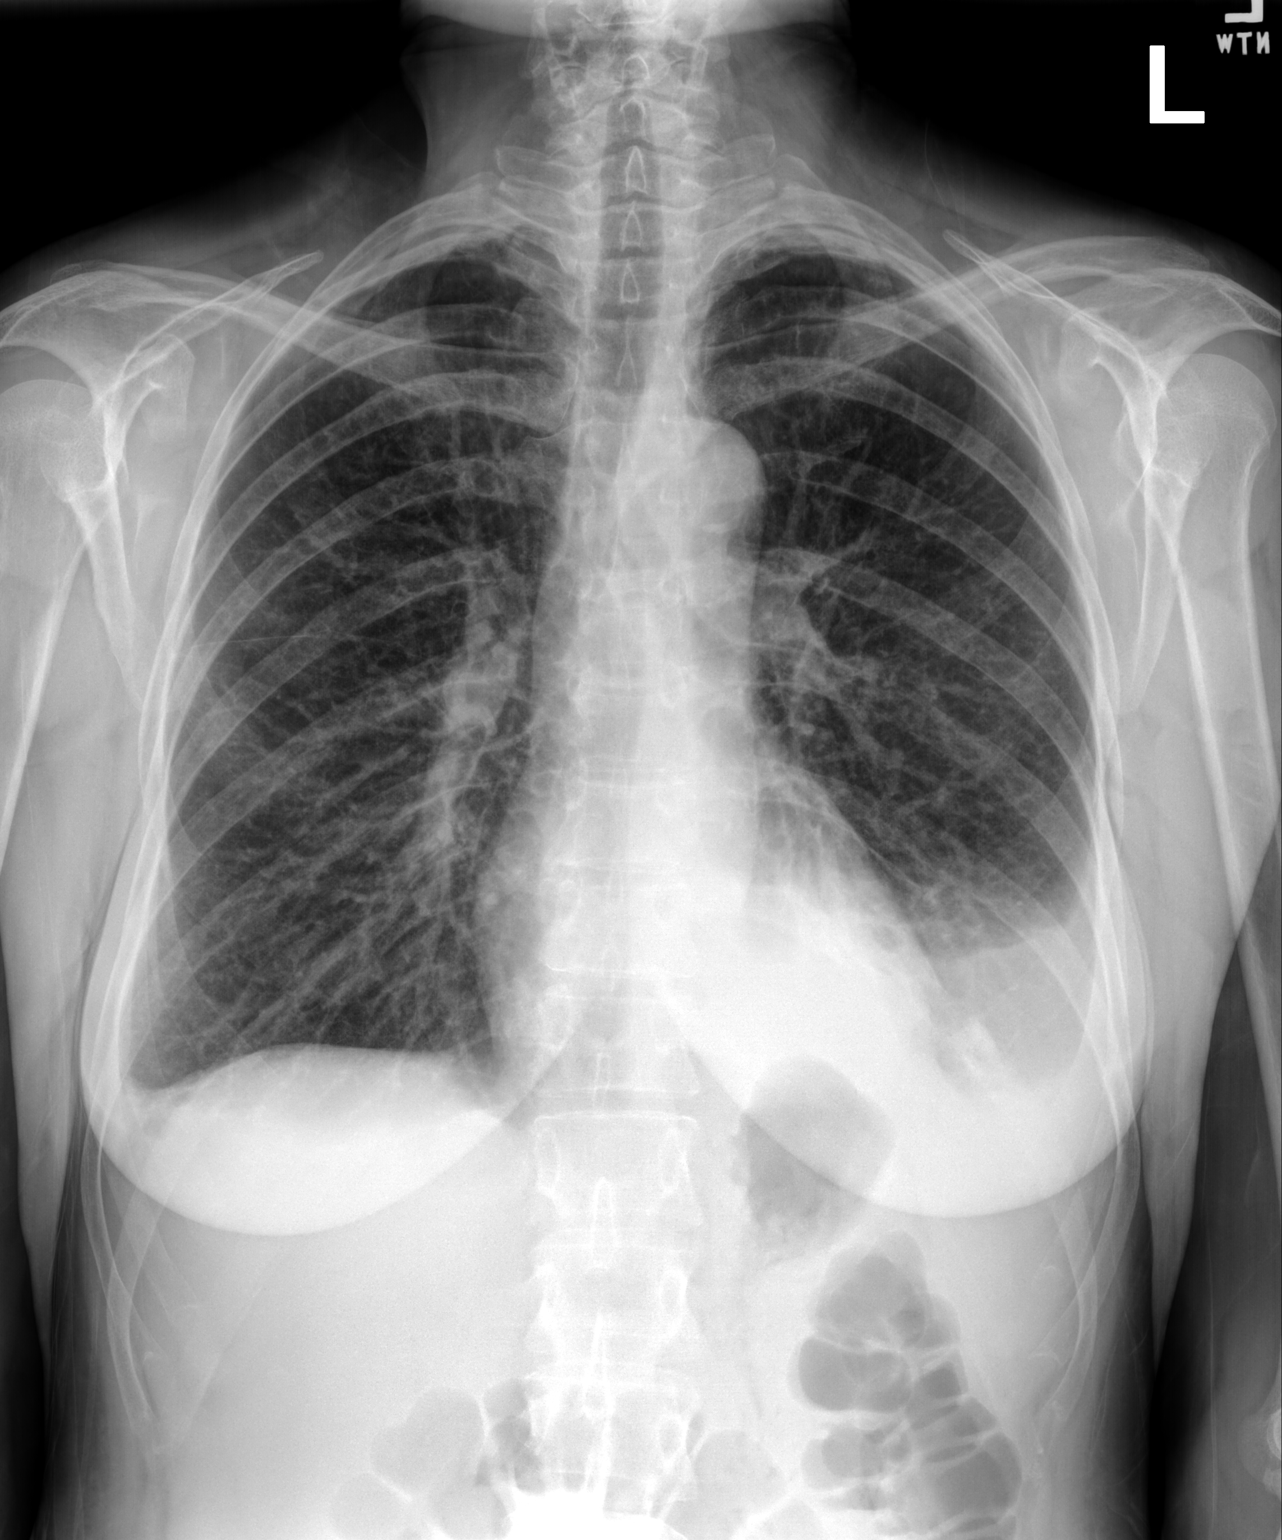

[chest lat]
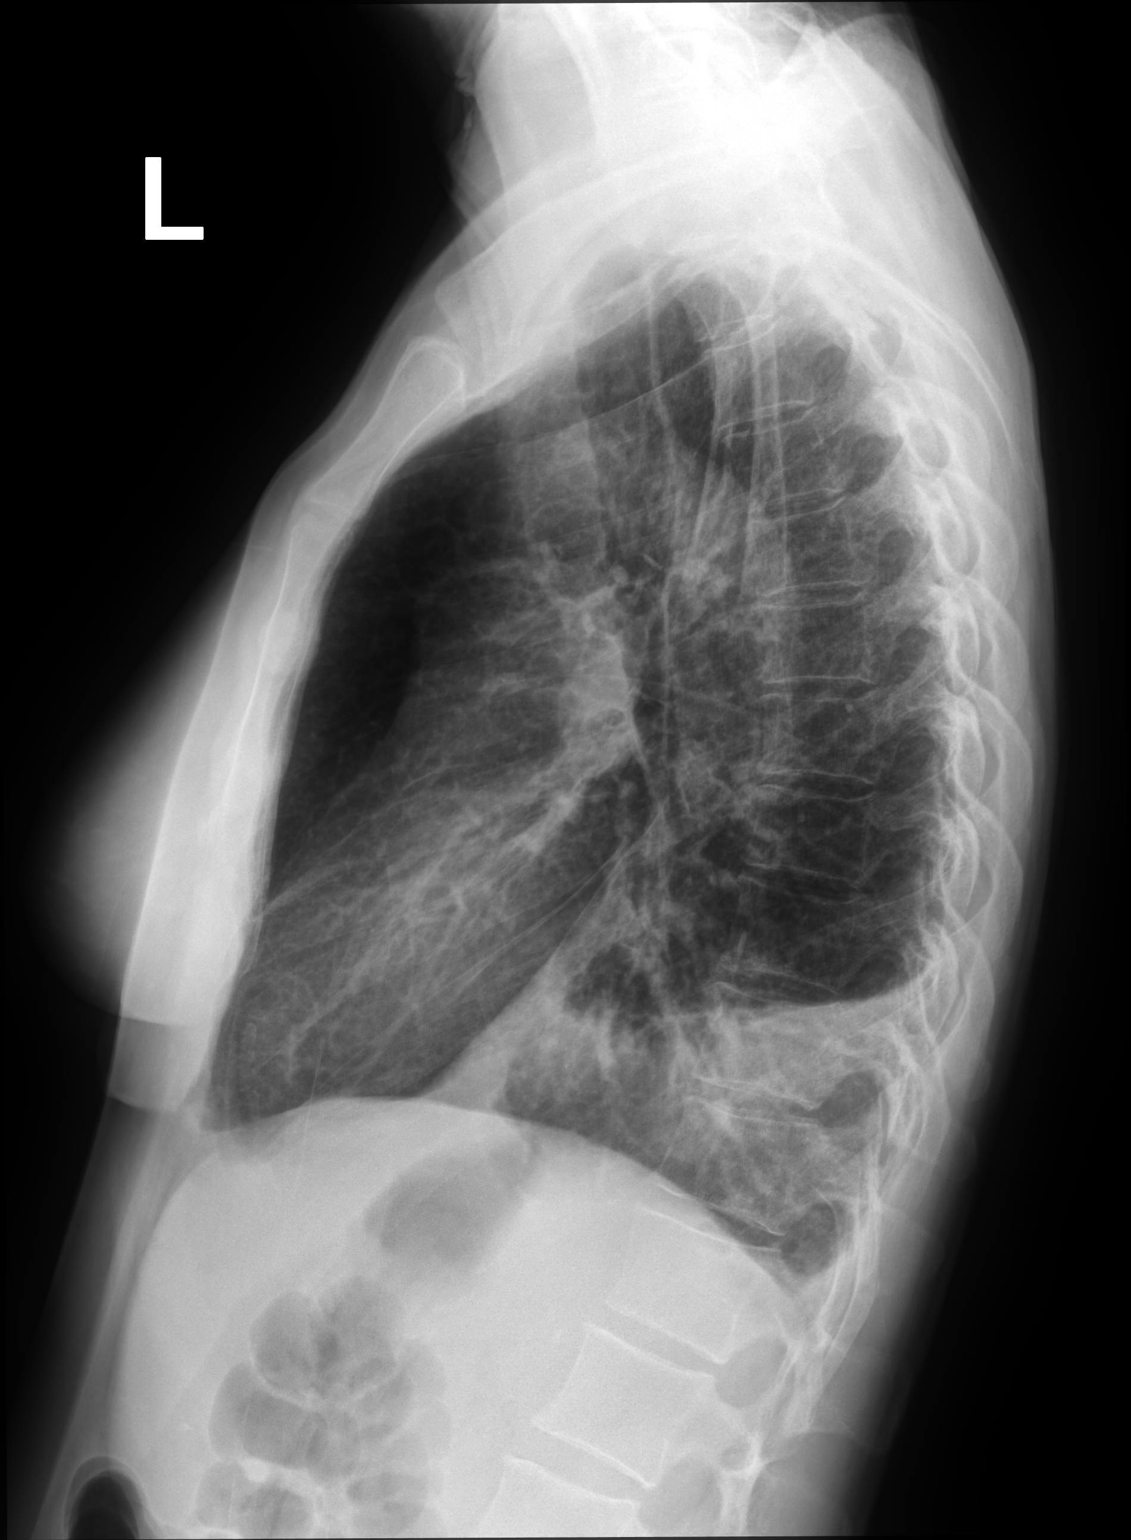

[2 of 2 positions shown; findings below may reference images not displayed]

FINDINGS: Opacity at the left lung base has increased when compared to the
most recent prior exam. This is consistent with a combination of
pleural fluid and atelectasis. Pneumonia is possible.

Minimal right pleural effusion with atelectasis. Remainder of the
lungs is clear.

Normal heart, mediastinum and hila.

No pneumothorax.

Skeletal structures are unremarkable.
IMPRESSION: 1. Increase in left lung base opacity, consistent with a combination
of an increase in pleural fluid and atelectasis. Consider pneumonia
in the proper clinical setting.
2. Minimal right lung base atelectasis and pleural fluid. Lungs
otherwise clear.

## 2019-05-08 ENCOUNTER — Encounter: Payer: Self-pay | Admitting: Gastroenterology

## 2019-07-27 ENCOUNTER — Other Ambulatory Visit: Payer: Self-pay | Admitting: Family Medicine

## 2019-07-27 ENCOUNTER — Other Ambulatory Visit: Payer: Self-pay | Admitting: Gastroenterology

## 2019-07-29 NOTE — Telephone Encounter (Signed)
I spoke with patient and she informed that she did request medication.  Pt is on schedule for a f/u visit next week.

## 2019-07-29 NOTE — Telephone Encounter (Signed)
Last fill 02/16/19  #30/0 Last OV  02/11/19

## 2019-07-29 NOTE — Telephone Encounter (Signed)
Please see if patient requested refill and if she needs an appointment to address anxiety.

## 2019-07-29 NOTE — Telephone Encounter (Signed)
Patient returned a call.  Tried the office but line was busy.  Please call patient back.

## 2019-07-29 NOTE — Telephone Encounter (Signed)
Lm for pt to call office back

## 2019-08-05 ENCOUNTER — Other Ambulatory Visit: Payer: Self-pay

## 2019-08-05 ENCOUNTER — Ambulatory Visit: Payer: 59 | Admitting: Family Medicine

## 2019-08-05 MED ORDER — PANTOPRAZOLE SODIUM 40 MG PO TBEC: 40.0000 mg | DELAYED_RELEASE_TABLET | Freq: Two times a day (BID) | ORAL | 2 refills | Status: DC

## 2019-08-05 NOTE — Progress Notes (Deleted)
Virtual Visit via Video   Due to the COVID-19 pandemic, this visit was completed with telemedicine (audio/video) technology to reduce patient and provider exposure as well as to preserve personal protective equipment.   I connected with Sarah Fry by a video enabled telemedicine application and verified that I am speaking with the correct person using two identifiers. Location patient: Home Location provider: [facility], Office Persons participating in the virtual visit: Sarah Fry, Briscoe Deutscher, DO   I discussed the limitations of evaluation and management by telemedicine and the availability of in person appointments. The patient expressed understanding and agreed to proceed.  Care Team   Patient Care Team: Briscoe Deutscher, DO as PCP - General (Family Medicine) Magrinat, Virgie Dad, MD as Consulting Physician (Oncology) Madelon Lips, MD as Consulting Physician (Nephrology) Ronnette Juniper, MD as Consulting Physician (Gastroenterology) Marshell Garfinkel, MD as Consulting Physician (Pulmonary Disease) Carolan Clines, MD (Inactive) as Consulting Physician (Urology)  Subjective:   HPI:   ROS   Patient Active Problem List   Diagnosis Date Noted  . Acute esophagitis 02/22/2019  . PUD (peptic ulcer disease) 02/22/2019  . History NSAID acute use 02/22/2019  . VSD (ventricular septal defect) 02/20/2019  . Hypokalemia 02/13/2019  . History of sepsis, 2/20 02/13/2019  . Tongue ulceration 02/13/2019  . Multiple renal cysts 02/12/2019  . Abnormal LFTs 02/12/2019  . Anemia 02/12/2019  . Pleural effusion 02/12/2019  . Hydronephrosis, bilateral 02/12/2019  . Lumbar degenerative disc disease 02/12/2019  . Incidental lung nodule, 4 mm subpleural RLL found on CT 2/20 02/12/2019  . Hiatal hernia, found on CT 02/12/2019  . Aortic atherosclerosis ([place]), found on CT 02/12/2019  . Renal calculus, left 02/12/2019  . History of nephrolithiasis 02/12/2019  . Smoker within  last 12 months, quit during hospitalization, 2/20, using nicotine patches 02/12/2019  . Ulcerative esophagitis, non-bleeding gastric ulcers, duodenal ulcers on EGD 02/05/19 02/12/2019  . Aortic valve calcification, ECHO 2/20 02/12/2019  . History of anaphylaxis, unclear cause 02/12/2019    Social History   Tobacco Use  . Smoking status: Former Smoker    Packs/day: 1.00    Years: 40.00    Pack years: 40.00    Types: Cigarettes    Quit date: 02/02/2019    Years since quitting: 0.5  . Smokeless tobacco: Never Used  Substance Use Topics  . Alcohol use: No    Current Outpatient Medications:  .  ALPRAZolam (XANAX) 0.5 MG tablet, TAKE 1 TABLET(0.5 MG) BY MOUTH AT BEDTIME AS NEEDED FOR ANXIETY, Disp: 30 tablet, Rfl: 1 .  docusate sodium (COLACE) 100 MG capsule, Take 100 mg by mouth 2 (two) times daily., Disp: , Rfl:  .  Multiple Vitamins-Minerals (CENTRUM PO), Take 1 tablet by mouth daily., Disp: , Rfl:  .  pantoprazole (PROTONIX) 40 MG tablet, Take 1 tablet (40 mg total) by mouth 2 (two) times daily., Disp: 60 tablet, Rfl: 2  Allergies  Allergen Reactions  . Flexeril [Cyclobenzaprine] Anaphylaxis  . Hydrocodone Anaphylaxis    Objective:   VITALS: Per patient if applicable, see vitals. GENERAL: Alert, appears well and in no acute distress. HEENT: Atraumatic, conjunctiva clear, no obvious abnormalities on inspection of external nose and ears. NECK: Normal movements of the head and neck. CARDIOPULMONARY: No increased WOB. Speaking in clear sentences. I:E ratio WNL.  MS: Moves all visible extremities without noticeable abnormality. PSYCH: Pleasant and cooperative, well-groomed. Speech normal rate and rhythm. Affect is appropriate. Insight and judgement are appropriate. Attention is focused, linear,  and appropriate.  NEURO: CN grossly intact. Oriented as arrived to appointment on time with no prompting. Moves both UE equally.  SKIN: No obvious lesions, wounds, erythema, or cyanosis noted on  face or hands.  Depression screen PHQ 2/9 02/02/2019  Decreased Interest 0  Down, Depressed, Hopeless 0  PHQ - 2 Score 0    Assessment and Plan:   There are no diagnoses linked to this encounter.  Marland Kitchen. COVID-19 Education: The signs and symptoms of COVID-19 were discussed with the patient and how to seek care for testing if needed. The importance of social distancing was discussed today. . Reviewed expectations re: course of current medical issues. . Discussed self-management of symptoms. . Outlined signs and symptoms indicating need for more acute intervention. . Patient verbalized understanding and all questions were answered. Marland Kitchen. Health Maintenance issues including appropriate healthy diet, exercise, and smoking avoidance were discussed with patient. . See orders for this visit as documented in the electronic medical record.  Helane RimaErica Phylis Javed, DO  Records requested if needed. Time spent: *** minutes, of which >50% was spent in obtaining information about her symptoms, reviewing her previous labs, evaluations, and treatments, counseling her about her condition (please see the discussed topics above), and developing a plan to further investigate it; she had a number of questions which I addressed.

## 2019-08-16 NOTE — Progress Notes (Signed)
Virtual Visit via Video   Due to the COVID-19 pandemic, this visit was completed with telemedicine (audio/video) technology to reduce patient and provider exposure as well as to preserve personal protective equipment.   I connected with Sarah Fry by a video enabled telemedicine application and verified that I am speaking with the correct person using two identifiers. Location patient: Home Location provider: [facility], Office Persons participating in the virtual visit: Sarah Fry, Torey Reinard, DO Barnie MortJoEllen Thompson, [place] acting as scribe for Dr. Helane RimaErica Zanetta Dehaan.    I discussed the limitations of evaluation and management by telemedicine and the availability of in person appointments. The patient expressed understanding and agreed to proceed.  Care Team   Patient Care Team: Helane RimaWallace, Kashira Behunin, DO as PCP - General (Family Medicine) Magrinat, Valentino HueGustav C, MD as Consulting Physician (Oncology) Bufford ButtnerUpton, Elizabeth, MD as Consulting Physician (Nephrology) Kerin SalenKarki, Arya, MD as Consulting Physician (Gastroenterology) Chilton GreathouseMannam, Praveen, MD as Consulting Physician (Pulmonary Disease) Jethro Bolusannenbaum, Sigmund, MD (Inactive) as Consulting Physician (Urology)  Subjective:   HPI: Patient in for refills on medications she is on the Protonix twice a day now. She is doing better but still having some GI discomfort.  Would like to know if something can be added to help with that.   She has had some tingling in arms and legs but has been better just wanted make sure better.  A lot of her hair fell out in may but has gotten better and is growing back now.   Still has one lesion in mouth that does cause discomfort when talking.     Review of Systems  Constitutional: Negative for chills and fever.  HENT: Negative for hearing loss and tinnitus.   Eyes: Negative for blurred vision and double vision.  Respiratory: Negative for cough and wheezing.   Cardiovascular: Negative for chest pain, palpitations  and leg swelling.  Gastrointestinal: Negative for nausea and vomiting.  Genitourinary: Negative for dysuria and urgency.  Neurological: Negative for tingling and headaches.  Psychiatric/Behavioral: Negative for depression and suicidal ideas.     Patient Active Problem List   Diagnosis Date Noted  . Acute esophagitis 02/22/2019  . PUD (peptic ulcer disease) 02/22/2019  . History NSAID acute use 02/22/2019  . VSD (ventricular septal defect) 02/20/2019  . Hypokalemia 02/13/2019  . History of sepsis, 2/20 02/13/2019  . Tongue ulceration 02/13/2019  . Multiple renal cysts 02/12/2019  . Abnormal LFTs 02/12/2019  . Anemia 02/12/2019  . Pleural effusion 02/12/2019  . Hydronephrosis, bilateral 02/12/2019  . Lumbar degenerative disc disease 02/12/2019  . Incidental lung nodule, 4 mm subpleural RLL found on CT 2/20 02/12/2019  . Hiatal hernia, found on CT 02/12/2019  . Aortic atherosclerosis (HCC), found on CT 02/12/2019  . Renal calculus, left 02/12/2019  . History of nephrolithiasis 02/12/2019  . Smoker within last 12 months, quit during hospitalization, 2/20, using nicotine patches 02/12/2019  . Ulcerative esophagitis, non-bleeding gastric ulcers, duodenal ulcers on EGD 02/05/19 02/12/2019  . Aortic valve calcification, ECHO 2/20 02/12/2019  . History of anaphylaxis, unclear cause 02/12/2019    Social History   Tobacco Use  . Smoking status: Former Smoker    Packs/day: 1.00    Years: 40.00    Pack years: 40.00    Types: Cigarettes    Quit date: 02/02/2019    Years since quitting: 0.5  . Smokeless tobacco: Never Used  Substance Use Topics  . Alcohol use: No   Current Outpatient Medications:  .  ALPRAZolam (XANAX) 0.5  MG tablet, TAKE 1 TABLET(0.5 MG) BY MOUTH AT BEDTIME AS NEEDED FOR ANXIETY, Disp: 30 tablet, Rfl: 1 .  docusate sodium (COLACE) 100 MG capsule, Take 100 mg by mouth 2 (two) times daily., Disp: , Rfl:  .  Multiple Vitamins-Minerals (CENTRUM PO), Take 1 tablet by mouth  daily., Disp: , Rfl:  .  pantoprazole (PROTONIX) 40 MG tablet, Take 1 tablet (40 mg total) by mouth 2 (two) times daily., Disp: 60 tablet, Rfl: 2  Allergies  Allergen Reactions  . Flexeril [Cyclobenzaprine] Anaphylaxis  . Hydrocodone Anaphylaxis    Objective:   VITALS: Per patient if applicable, see vitals. GENERAL: Alert, appears well and in no acute distress. HEENT: Atraumatic, conjunctiva clear, no obvious abnormalities on inspection of external nose and ears. NECK: Normal movements of the head and neck. CARDIOPULMONARY: No increased WOB. Speaking in clear sentences. I:E ratio WNL.  MS: Moves all visible extremities without noticeable abnormality. PSYCH: Pleasant and cooperative, well-groomed. Speech normal rate and rhythm. Affect is appropriate. Insight and judgement are appropriate. Attention is focused, linear, and appropriate.  NEURO: CN grossly intact. Oriented as arrived to appointment on time with no prompting. Moves both UE equally.  SKIN: No obvious lesions, wounds, erythema, or cyanosis noted on face or hands.  Depression screen PHQ 2/9 02/02/2019  Decreased Interest 0  Down, Depressed, Hopeless 0  PHQ - 2 Score 0   Assessment and Plan:   There are no diagnoses linked to this encounter.  Marland Kitchen COVID-19 Education: The signs and symptoms of COVID-19 were discussed with the patient and how to seek care for testing if needed. The importance of social distancing was discussed today. . Reviewed expectations re: course of current medical issues. . Discussed self-management of symptoms. . Outlined signs and symptoms indicating need for more acute intervention. . Patient verbalized understanding and all questions were answered. Marland Kitchen Health Maintenance issues including appropriate healthy diet, exercise, and smoking avoidance were discussed with patient. . See orders for this visit as documented in the electronic medical record.  Briscoe Deutscher, DO  Records requested if needed. Time  spent: 25 minutes, of which >50% was spent in obtaining information about her symptoms, reviewing her previous labs, evaluations, and treatments, counseling her about her condition (please see the discussed topics above), and developing a plan to further investigate it; she had a number of questions which I addressed.

## 2019-08-17 ENCOUNTER — Encounter: Payer: Self-pay | Admitting: Family Medicine

## 2019-08-17 ENCOUNTER — Ambulatory Visit (INDEPENDENT_AMBULATORY_CARE_PROVIDER_SITE_OTHER): Payer: 59 | Admitting: Family Medicine

## 2019-08-17 VITALS — Ht 65.0 in | Wt 122.0 lb

## 2019-08-17 DIAGNOSIS — E559 Vitamin D deficiency, unspecified: Secondary | ICD-10-CM

## 2019-08-17 DIAGNOSIS — M5136 Other intervertebral disc degeneration, lumbar region: Secondary | ICD-10-CM

## 2019-08-17 DIAGNOSIS — Z13220 Encounter for screening for lipoid disorders: Secondary | ICD-10-CM

## 2019-08-17 DIAGNOSIS — D649 Anemia, unspecified: Secondary | ICD-10-CM | POA: Diagnosis not present

## 2019-08-17 DIAGNOSIS — K140 Glossitis: Secondary | ICD-10-CM | POA: Diagnosis not present

## 2019-08-17 DIAGNOSIS — K279 Peptic ulcer, site unspecified, unspecified as acute or chronic, without hemorrhage or perforation: Secondary | ICD-10-CM | POA: Diagnosis not present

## 2019-08-17 DIAGNOSIS — E7800 Pure hypercholesterolemia, unspecified: Secondary | ICD-10-CM

## 2019-08-17 DIAGNOSIS — K449 Diaphragmatic hernia without obstruction or gangrene: Secondary | ICD-10-CM | POA: Diagnosis not present

## 2019-08-17 DIAGNOSIS — I359 Nonrheumatic aortic valve disorder, unspecified: Secondary | ICD-10-CM

## 2019-08-17 DIAGNOSIS — E538 Deficiency of other specified B group vitamins: Secondary | ICD-10-CM

## 2019-08-17 DIAGNOSIS — Q6102 Congenital multiple renal cysts: Secondary | ICD-10-CM

## 2019-08-17 MED ORDER — ALPRAZOLAM 0.5 MG PO TABS: ORAL_TABLET | ORAL | 1 refills | Status: DC

## 2019-08-17 MED ORDER — PANTOPRAZOLE SODIUM 40 MG PO TBEC: 40.0000 mg | DELAYED_RELEASE_TABLET | Freq: Two times a day (BID) | ORAL | 2 refills | Status: DC

## 2019-08-18 ENCOUNTER — Other Ambulatory Visit: Payer: 59

## 2019-08-18 ENCOUNTER — Other Ambulatory Visit (INDEPENDENT_AMBULATORY_CARE_PROVIDER_SITE_OTHER): Payer: 59

## 2019-08-18 ENCOUNTER — Other Ambulatory Visit: Payer: Self-pay

## 2019-08-18 DIAGNOSIS — K279 Peptic ulcer, site unspecified, unspecified as acute or chronic, without hemorrhage or perforation: Secondary | ICD-10-CM | POA: Diagnosis not present

## 2019-08-18 DIAGNOSIS — D649 Anemia, unspecified: Secondary | ICD-10-CM

## 2019-08-18 DIAGNOSIS — E559 Vitamin D deficiency, unspecified: Secondary | ICD-10-CM

## 2019-08-18 DIAGNOSIS — Z13220 Encounter for screening for lipoid disorders: Secondary | ICD-10-CM

## 2019-08-18 LAB — CBC WITH DIFFERENTIAL/PLATELET
Basophils Absolute: 0 10*3/uL (ref 0.0–0.1)
Basophils Relative: 0.7 % (ref 0.0–3.0)
Eosinophils Absolute: 0.1 10*3/uL (ref 0.0–0.7)
Eosinophils Relative: 1.6 % (ref 0.0–5.0)
HCT: 38.7 % (ref 36.0–46.0)
Hemoglobin: 13.2 g/dL (ref 12.0–15.0)
Lymphocytes Relative: 34.2 % (ref 12.0–46.0)
Lymphs Abs: 1.8 10*3/uL (ref 0.7–4.0)
MCHC: 34 g/dL (ref 30.0–36.0)
MCV: 88.7 fl (ref 78.0–100.0)
Monocytes Absolute: 0.4 10*3/uL (ref 0.1–1.0)
Monocytes Relative: 7.9 % (ref 3.0–12.0)
Neutro Abs: 3 10*3/uL (ref 1.4–7.7)
Neutrophils Relative %: 55.6 % (ref 43.0–77.0)
Platelets: 246 10*3/uL (ref 150.0–400.0)
RBC: 4.37 Mil/uL (ref 3.87–5.11)
RDW: 14 % (ref 11.5–15.5)
WBC: 5.4 10*3/uL (ref 4.0–10.5)

## 2019-08-18 LAB — COMPREHENSIVE METABOLIC PANEL
ALT: 9 U/L (ref 0–35)
AST: 11 U/L (ref 0–37)
Albumin: 4.4 g/dL (ref 3.5–5.2)
Alkaline Phosphatase: 82 U/L (ref 39–117)
BUN: 14 mg/dL (ref 6–23)
CO2: 28 mEq/L (ref 19–32)
Calcium: 9.4 mg/dL (ref 8.4–10.5)
Chloride: 105 mEq/L (ref 96–112)
Creatinine, Ser: 0.64 mg/dL (ref 0.40–1.20)
GFR: 95.36 mL/min (ref >60.00)
Glucose, Bld: 92 mg/dL (ref 70–99)
Potassium: 4.1 mEq/L (ref 3.5–5.1)
Sodium: 141 mEq/L (ref 135–145)
Total Bilirubin: 0.5 mg/dL (ref 0.2–1.2)
Total Protein: 6.7 g/dL (ref 6.0–8.3)

## 2019-08-18 LAB — LIPID PANEL
Cholesterol: 257 mg/dL — ABNORMAL HIGH (ref 0–200)
HDL: 52 mg/dL (ref >39.00)
LDL Cholesterol: 186 mg/dL — ABNORMAL HIGH (ref 0–99)
NonHDL: 205.25
Total CHOL/HDL Ratio: 5
Triglycerides: 95 mg/dL (ref 0.0–149.0)
VLDL: 19 mg/dL (ref 0.0–40.0)

## 2019-08-18 LAB — VITAMIN B12: Vitamin B-12: 201 pg/mL — ABNORMAL LOW (ref 211–911)

## 2019-08-18 LAB — TSH: TSH: 2.82 u[IU]/mL (ref 0.35–4.50)

## 2019-08-18 LAB — VITAMIN D 25 HYDROXY (VIT D DEFICIENCY, FRACTURES): VITD: 18.26 ng/mL — ABNORMAL LOW (ref 30.00–100.00)

## 2019-08-22 ENCOUNTER — Encounter: Payer: Self-pay | Admitting: Family Medicine

## 2019-08-22 DIAGNOSIS — E538 Deficiency of other specified B group vitamins: Secondary | ICD-10-CM | POA: Insufficient documentation

## 2019-08-22 DIAGNOSIS — E559 Vitamin D deficiency, unspecified: Secondary | ICD-10-CM | POA: Insufficient documentation

## 2019-08-22 DIAGNOSIS — E785 Hyperlipidemia, unspecified: Secondary | ICD-10-CM | POA: Insufficient documentation

## 2019-08-24 ENCOUNTER — Encounter: Payer: Self-pay | Admitting: Family Medicine

## 2019-08-26 ENCOUNTER — Other Ambulatory Visit: Payer: Self-pay

## 2019-08-26 MED ORDER — DEXILANT 30 MG PO CPDR: 30.0000 mg | DELAYED_RELEASE_CAPSULE | Freq: Every day | ORAL | 1 refills | Status: DC

## 2019-08-26 MED ORDER — "SYRINGE 25G X 1"" 3 ML MISC": 1.0000 "application " | 0 refills | Status: DC

## 2019-08-26 MED ORDER — CYANOCOBALAMIN 1000 MCG/ML IJ SOLN: INTRAMUSCULAR | 0 refills | Status: DC

## 2019-08-26 NOTE — Telephone Encounter (Signed)
Called in see results note patient informed as well.

## 2019-10-14 ENCOUNTER — Encounter: Payer: Self-pay | Admitting: Family Medicine

## 2019-10-14 NOTE — Telephone Encounter (Signed)
Refill are pulled up let me know so that I can send message about the the provider questions?

## 2019-10-15 MED ORDER — ALPRAZOLAM 0.5 MG PO TABS: ORAL_TABLET | ORAL | 1 refills | Status: DC

## 2019-10-15 MED ORDER — "SYRINGE 25G X 1"" 3 ML MISC": 1.0000 "application " | 0 refills | Status: DC

## 2019-10-15 MED ORDER — PANTOPRAZOLE SODIUM 40 MG PO TBEC: 40.0000 mg | DELAYED_RELEASE_TABLET | Freq: Two times a day (BID) | ORAL | 2 refills | Status: DC

## 2019-10-15 MED ORDER — DEXILANT 30 MG PO CPDR: 30.0000 mg | DELAYED_RELEASE_CAPSULE | Freq: Every day | ORAL | 1 refills | Status: DC

## 2019-10-15 MED ORDER — CYANOCOBALAMIN 1000 MCG/ML IJ SOLN: INTRAMUSCULAR | 0 refills | Status: DC

## 2020-06-24 ENCOUNTER — Encounter: Payer: 59 | Admitting: Family Medicine

## 2020-06-30 ENCOUNTER — Other Ambulatory Visit: Payer: Self-pay

## 2020-06-30 ENCOUNTER — Ambulatory Visit (INDEPENDENT_AMBULATORY_CARE_PROVIDER_SITE_OTHER): Payer: 59 | Admitting: Family Medicine

## 2020-06-30 ENCOUNTER — Encounter: Payer: Self-pay | Admitting: Family Medicine

## 2020-06-30 VITALS — BP 110/62 | HR 75 | Temp 97.5°F | Ht 65.0 in | Wt 129.0 lb

## 2020-06-30 DIAGNOSIS — E559 Vitamin D deficiency, unspecified: Secondary | ICD-10-CM

## 2020-06-30 DIAGNOSIS — E538 Deficiency of other specified B group vitamins: Secondary | ICD-10-CM

## 2020-06-30 DIAGNOSIS — Z8619 Personal history of other infectious and parasitic diseases: Secondary | ICD-10-CM

## 2020-06-30 DIAGNOSIS — Z87891 Personal history of nicotine dependence: Secondary | ICD-10-CM | POA: Diagnosis not present

## 2020-06-30 DIAGNOSIS — N200 Calculus of kidney: Secondary | ICD-10-CM

## 2020-06-30 DIAGNOSIS — F5104 Psychophysiologic insomnia: Secondary | ICD-10-CM | POA: Diagnosis not present

## 2020-06-30 MED ORDER — ALPRAZOLAM 0.5 MG PO TABS: ORAL_TABLET | ORAL | 2 refills | Status: DC

## 2020-06-30 NOTE — Patient Instructions (Signed)
Please return as scheduled for you complete physical, please come fasting.  10/28/2020  It was a pleasure meeting you today! Thank you for choosing [place] to meet your healthcare needs! I truly look forward to working with you. If you have any questions or concerns, please send me a message via Mychart or call the office at [phone].  Think about healthy screens like pap smears, mammograms and colon cancer screens like we discussed.

## 2020-06-30 NOTE — Progress Notes (Signed)
Subjective  CC:  Chief Complaint  Patient presents with  . Transitions Of Care    from Sarah Fry   . Follow-up    still having odd taste after ICU   . Dysmenorrhea    in legs and feet.     HPI: Sarah Fry is a 59 y.o. female who presents to [facility] [place] [facility] today to establish care with me as a new patient.  59 year old female mostly healthy presents for transition of care.  I reviewed old medical records including hospital stay from a year ago, follow-up PCP and specialist visits, lab work and imaging study reports. She has the following concerns or needs:  History of sepsis, from chart review source seem to be thought from urosepsis.  Patient overall has recovered well however still has what she thinks is a peripheral neuropathy in her feet and hands suffer from oral mouth lesions that are mostly all healed except 1.  She feels her energy level is almost back to normal but not quite.  No other chronic complications She needs a refill on her Xanax.  She has been using Xanax 2-3 times nightly mainly for sleep.  She feels she tends to be a high energy individual, hard-working and driven and knows that anxiety tends to be a motivator for her.  She has not been she has chronic anxiety disorder.  She has never tried other sleep medications outside of melatonin.  No side effects. I reviewed patient's records from the PMP aware controlled substance registry today.  She was a long-term former smoker but denies symptoms or complications from her smoking history.  She quit in February 2020.  She has not had lung cancer screening.  Incidental pulmonary nodule was found on a CT back at that time. History of kidney stones.  Not currently active Health maintenance: She is in tune to her body feels she knows when she is well or not.  She is not an avid user of the health system and does not routinely get physicals or health screenings.  We did have a discussion regarding pros and  cons of screening test, annual exams, etc.  Patient is open to possibly considering mammogram and Pap smear in the future.  Her colon cancer screening is up-to-date.  She does decline most vaccinations including Covid at this time.  Assessment  1. Psychophysiological insomnia   2. History of sepsis, 2/20   3. Former smoker   4. Nephrolithiasis   5. B12 deficiency   6. Vitamin D deficiency      Plan   Psychophysiological insomnia mainly and anxiety, mostly work-related: Currently well controlled with intermittent Xanax.  Discussed pros and cons of this strategy.  Continue for now but would consider other options including things like trazodone.  No mood disorder identified.  [place] drug database shows appropriate prescribing use  History of sepsis: She feels she is recovering, will continue to monitor.  History of mouth lesion.  If not resolved at next visit would consider biopsy.  Former smoker: We will discuss colon cancer screening at next visit in her physical.  Health maintenance: Patient will return for physical with lab work.  She will consider Pap smear and mammogram in the future.  Discussed risk versus benefits  Currently on maintenance supplements.  We will recheck her levels at her next visit.  Healthy diet.  Active lifestyle.  Follow up: Follow-up as scheduled for complete physical No orders of the defined types were  placed in this encounter.  Meds ordered this encounter  Medications  . ALPRAZolam (XANAX) 0.5 MG tablet    Sig: TAKE 1 TABLET(0.5 MG) BY MOUTH AT BEDTIME AS NEEDED FOR ANXIETY    Dispense:  30 tablet    Refill:  2     Depression screen [facility] 2/9 08/17/2019 02/02/2019  Decreased Interest 0 0  Down, Depressed, Hopeless 0 0  PHQ - 2 Score 0 0  Altered sleeping 0 -  Tired, decreased energy 0 -  Change in appetite 0 -  Feeling bad or failure about yourself  0 -  Trouble concentrating 0 -  Moving slowly or fidgety/restless 0 -  Suicidal thoughts 0 -    PHQ-9 Score 0 -  Difficult doing work/chores Not difficult at all -    We updated and reviewed the patient's past history in detail and it is documented below.  Patient Active Problem List   Diagnosis Date Noted  . Multiple renal cysts, see CT 02/05/19 02/12/2019    Priority: Medium  . Lumbar degenerative disc disease, with disc height loss L4-5 with bilateral facet arthropathy 02/12/2019    Priority: Medium  . Incidental lung nodule, 4 mm subpleural RLL found on CT 2/20 02/12/2019    Priority: Medium  . Nephrolithiasis 02/12/2019    Priority: Medium  . Former smoker 02/12/2019    Priority: Medium    40 pak year; quit 01/2019   . B12 deficiency 08/22/2019    Priority: Low  . History of sepsis, 2/20 02/13/2019    Priority: Low  . Vitamin D deficiency 08/22/2019  . Tongue ulceration 02/13/2019   Health Maintenance  Topic Date Due  . PAP SMEAR-Modifier  06/30/2020 (Originally 10/10/1982)  . COVID-19 Vaccine (1) 07/16/2020 (Originally 10[birth year]10/1973)  . MAMMOGRAM  06/30/2021 (Originally 10/11/1979)  . INFLUENZA VACCINE  07/31/2020  . COLONOSCOPY  02/07/2022  . Hepatitis C Screening  Completed  . HIV Screening  Completed  . TETANUS/TDAP  Discontinued    There is no immunization history on file for this patient. Current Meds  Medication Sig  . ALPRAZolam (XANAX) 0.5 MG tablet TAKE 1 TABLET(0.5 MG) BY MOUTH AT BEDTIME AS NEEDED FOR ANXIETY  . cyanocobalamin (,VITAMIN B-12,) 1000 MCG/ML injection 1000 mcg (1 mg) injection once per week for four weeks, followed by 1000 mcg injection once per month.  . Syringe/Needle, Disp, (SYRINGE 3CC/25GX1") 25G X 1" 3 ML MISC 1 application by Does not apply route once a week.  . [DISCONTINUED] ALPRAZolam (XANAX) 0.5 MG tablet TAKE 1 TABLET(0.5 MG) BY MOUTH AT BEDTIME AS NEEDED FOR ANXIETY    Allergies: Patient is allergic to flexeril [cyclobenzaprine] and hydrocodone. Past Medical History Patient  has a past medical history of Abnormal LFTs  (02/12/2019), Acute esophagitis (02/22/2019), Anemia (02/12/2019), Depression, Genital warts, Heart murmur, Hiatal hernia, found on CT (02/12/2019), History of anaphylaxis, unclear cause (02/12/2019), Hydronephrosis, bilateral (02/12/2019), Kidney stones, Pleural effusion (02/12/2019), Renal calculus, left (02/12/2019), Sepsis (HCC) (2020), and Ulcerative esophagitis, non-bleeding gastric ulcers, duodenal ulcers on EGD 02/05/19 (02/12/2019). Past Surgical History Patient  has a past surgical history that includes Esophagogastroduodenoscopy (egd) with propofol (N/A, 02/05/2019) and Cesarean section (1989). Family History: Patient family history includes Alzheimer's disease in her mother; Liver disease in her father. Social History:  Patient  reports that she quit smoking about 16 months ago. Her smoking use included cigarettes. She has a 40.00 pack-year smoking history. She has never used smokeless tobacco. She reports that she does not drink alcohol and does  not use drugs.  Review of Systems: Constitutional: negative for fever or malaise Ophthalmic: negative for photophobia, double vision or loss of vision Cardiovascular: negative for chest pain, dyspnea on exertion, or new LE swelling Respiratory: negative for SOB or persistent cough Gastrointestinal: negative for abdominal pain, change in bowel habits or melena Genitourinary: negative for dysuria or gross hematuria Musculoskeletal: negative for new gait disturbance or muscular weakness Integumentary: negative for new or persistent rashes Neurological: negative for TIA or stroke symptoms Psychiatric: negative for SI or delusions Allergic/Immunologic: negative for hives  Patient Care Team    Relationship Specialty Notifications Start End  Willow Ora, MD PCP - General Family Medicine  06/30/20   Magrinat, Valentino Hue, MD Consulting Physician Oncology  02/12/19   Bufford Buttner, MD Consulting Physician Nephrology  02/12/19   Kerin Salen, MD Consulting  Physician Gastroenterology  02/13/19   Chilton Greathouse, MD Consulting Physician Pulmonary Disease  02/14/19   Jethro Bolus, MD (Inactive) Consulting Physician Urology  02/20/19     Objective  Vitals: BP 110/62   Pulse 75   Temp (!) 97.5 F (36.4 C) (Temporal)   Ht 5\' 5"  (1.651 m)   Wt 129 lb (58.5 kg)   SpO2 96%   BMI 21.47 kg/m  General:  Well developed, well nourished, no acute distress  Psych:  Alert and oriented,normal mood and affect HEENT:  Normocephalic, atraumatic, non-icteric sclera, Neurologic:    Mental status is normal. Gross motor and sensory exams are normal. Normal gait   Commons side effects, risks, benefits, and alternatives for medications and treatment plan prescribed today were discussed, and the patient expressed understanding of the given instructions. Patient is instructed to call or message via MyChart if he/she has any questions or concerns regarding our treatment plan. No barriers to understanding were identified. We discussed Red Flag symptoms and signs in detail. Patient expressed understanding regarding what to do in case of urgent or emergency type symptoms.   Medication list was reconciled, printed and provided to the patient in AVS. Patient instructions and summary information was reviewed with the patient as documented in the AVS. This note was prepared with assistance of Dragon voice recognition software. Occasional wrong-word or sound-a-like substitutions may have occurred due to the inherent limitations of voice recognition software  This visit occurred during the SARS-CoV-2 public health emergency.  Safety protocols were in place, including screening questions prior to the visit, additional usage of staff PPE, and extensive cleaning of exam room while observing appropriate contact time as indicated for disinfecting solutions.

## 2020-07-19 ENCOUNTER — Other Ambulatory Visit: Payer: Self-pay | Admitting: Family Medicine

## 2020-07-19 NOTE — Telephone Encounter (Signed)
Please review

## 2020-08-04 ENCOUNTER — Telehealth: Payer: Self-pay | Admitting: Family Medicine

## 2020-08-04 NOTE — Telephone Encounter (Signed)
Patient is returning phone call.  °

## 2020-08-04 NOTE — Telephone Encounter (Signed)
LMOVM advising patient to call back

## 2020-08-04 NOTE — Telephone Encounter (Signed)
I called and spoke to the pharmacy. Patient picked up 30 day supply on 07/22/20. LMOVM advising patient to return call

## 2020-08-04 NOTE — Telephone Encounter (Signed)
Pt was prescribed xanax with 3 RF 06/2020; she should have 2 refills left. However, please let her know this should be for prn use.  Will need office visit to discuss other treatments if nightly use persists or is needed.   Thanks.  Pharmacy should be able to refill. Thanks.

## 2020-10-28 ENCOUNTER — Encounter: Payer: Self-pay | Admitting: Family Medicine

## 2020-10-28 ENCOUNTER — Other Ambulatory Visit: Payer: Self-pay

## 2020-10-28 ENCOUNTER — Ambulatory Visit: Payer: 59 | Admitting: Family Medicine

## 2020-10-28 VITALS — BP 122/68 | HR 94 | Temp 98.3°F | Ht 65.0 in | Wt 124.8 lb

## 2020-10-28 DIAGNOSIS — Z2821 Immunization not carried out because of patient refusal: Secondary | ICD-10-CM

## 2020-10-28 DIAGNOSIS — E559 Vitamin D deficiency, unspecified: Secondary | ICD-10-CM | POA: Diagnosis not present

## 2020-10-28 DIAGNOSIS — Z87891 Personal history of nicotine dependence: Secondary | ICD-10-CM

## 2020-10-28 DIAGNOSIS — R911 Solitary pulmonary nodule: Secondary | ICD-10-CM

## 2020-10-28 DIAGNOSIS — Z0000 Encounter for general adult medical examination without abnormal findings: Secondary | ICD-10-CM | POA: Diagnosis not present

## 2020-10-28 NOTE — Patient Instructions (Signed)
Please return in 12 months for your annual complete physical; please come fasting.  I will release your lab results to you on your MyChart account with further instructions. Please reply with any questions.   Take care and I am happy you are feeling well.   If you have any questions or concerns, please don't hesitate to send me a message via MyChart or call the office at [phone]. Thank you for visiting with [place] today! It's our pleasure caring for you.

## 2020-10-28 NOTE — Progress Notes (Signed)
Subjective  Chief Complaint  Patient presents with   Transitions Of Care   Annual Exam    coffee with creamer and sugar this morning     HPI: Sarah Fry is a 59 y.o. female who presents to [facility] [place] today for a Female Wellness Visit. She also has the concerns and/or needs as listed above in the chief complaint. These will be addressed in addition to the Health Maintenance Visit.   Wellness Visit: annual visit with health maintenance review and exam without Pap   HM: pt declines colon, cervical and lung cancer screens at this time. Aware of recommendations. Declines vaccines.  Chronic disease f/u and/or acute problem visit: (deemed necessary to be done in addition to the wellness visit):  Recovered from urosepsis; feeling well again.    Assessment  1. Annual physical exam   2. Vitamin D deficiency   3. Former smoker   4. Incidental lung nodule, 4 mm subpleural RLL found on CT 2/20   5. Vaccination refused by patient      Plan  Female Wellness Visit:  Age appropriate Health Maintenance and Prevention measures were discussed with patient. Included topics are cancer screening recommendations, ways to keep healthy (see AVS) including dietary and exercise recommendations, regular eye and dental care, use of seat belts, and avoidance of moderate alcohol use and tobacco use. Declines pap, mammo and lung ca CT.   BMI: discussed patient's BMI and encouraged positive lifestyle modifications to help get to or maintain a target BMI.  HM needs and immunizations were addressed and ordered. See below for orders. See HM and immunization section for updates.declines  Routine labs and screening tests ordered including cmp, cbc and lipids where appropriate.  Discussed recommendations regarding Vit D and calcium supplementation (see AVS)    Follow up: 12 mo for cpe  Orders Placed This Encounter  Procedures   CBC with Differential/Platelet   COMPLETE  METABOLIC PANEL WITH GFR   Lipid panel   TSH   VITAMIN D 25 Hydroxy (Vit-D Deficiency, Fractures)   No orders of the defined types were placed in this encounter.     Lifestyle: Body mass index is 20.77 kg/m. 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    Nephrolithiasis 02/12/2019    Priority: Medium   Former smoker 02/12/2019    Priority: Medium    40 pak year; quit 01/2019 Had chest CT 2020 Declines lung cancer screening    B12 deficiency 08/22/2019    Priority: Low   Vitamin D deficiency 08/22/2019    Priority: Low   History of sepsis, 2/20 02/13/2019    Priority: Low   Vaccination refused by patient 10/28/2020   Health Maintenance  Topic Date Due   COVID-19 Vaccine (1) 11/13/2020 (Originally 10/10/1973)   INFLUENZA VACCINE  03/30/2021 (Originally 07/31/2020)   MAMMOGRAM  06/30/2021 (Originally 10/11/1979)   COLONOSCOPY  02/07/2022   Hepatitis C Screening  Completed  HIV Screening  Completed   PAP SMEAR-Modifier  Discontinued   TETANUS/TDAP  Discontinued    There is no immunization history on file for this patient. We updated and reviewed the patient's past history in detail and it is documented below. Allergies: Patient is allergic to flexeril [cyclobenzaprine] and hydrocodone. Past Medical History Patient  has a past medical history of Abnormal LFTs (02/12/2019), Acute esophagitis (02/22/2019), Anemia (02/12/2019), Depression, Genital warts, Heart murmur, Hiatal hernia, found on CT  (02/12/2019), History of anaphylaxis, unclear cause (02/12/2019), Hydronephrosis, bilateral (02/12/2019), Kidney stones, Pleural effusion (02/12/2019), Renal calculus, left (02/12/2019), Sepsis (HCC) (2020), and Ulcerative esophagitis, non-bleeding gastric ulcers, duodenal ulcers on EGD 02/05/19 (02/12/2019). Past Surgical History Patient  has a past surgical history that includes Esophagogastroduodenoscopy (egd) with propofol (N/A, 02/05/2019) and Cesarean section (1989). Family History: Patient family history includes Alzheimer's disease in her mother; Liver disease in her father. Social History:  Patient  reports that she quit smoking about 20 months ago. Her smoking use included cigarettes. She has a 40.00 pack-year smoking history. She has never used smokeless tobacco. She reports that she does not drink alcohol and does not use drugs.  Review of Systems: Constitutional: negative for fever or malaise Ophthalmic: negative for photophobia, double vision or loss of vision Cardiovascular: negative for chest pain, dyspnea on exertion, or new LE swelling Respiratory: negative for SOB or persistent cough Gastrointestinal: negative for abdominal pain, change in bowel habits or melena Genitourinary: negative for dysuria or gross hematuria, no abnormal uterine bleeding or disharge Musculoskeletal: negative for new gait disturbance or muscular weakness Integumentary: negative for new or persistent rashes, no breast lumps Neurological: negative for TIA or stroke symptoms Psychiatric: negative for SI or delusions Allergic/Immunologic: negative for hives  Patient Care Team    Relationship Specialty Notifications Start End  Willow Ora, MD PCP - General Family Medicine  06/30/20   Magrinat, Valentino Hue, MD Consulting Physician Oncology  02/12/19   Bufford Buttner, MD Consulting Physician Nephrology  02/12/19   Kerin Salen, MD Consulting Physician Gastroenterology  02/13/19   Chilton Greathouse, MD Consulting  Physician Pulmonary Disease  02/14/19   Jethro Bolus, MD (Inactive) Consulting Physician Urology  02/20/19     Objective  Vitals: BP 122/68    Pulse 94    Temp 98.3 F (36.8 C) (Temporal)    Ht 5\' 5"  (1.651 m)    Wt 124 lb 12.8 oz (56.6 kg)    SpO2 98%    BMI 20.77 kg/m  General:  Well developed, well nourished, no acute distress  Psych:  Alert and orientedx3,normal mood and affect HEENT:  Normocephalic, atraumatic, non-icteric sclera,  supple neck without adenopathy, mass or thyromegaly Cardiovascular:  Normal S1, S2, RRR without gallop, rub or murmur Respiratory:  Good breath sounds bilaterally, CTAB with normal respiratory effort Gastrointestinal: normal bowel sounds, soft, non-tender, no noted masses. No HSM MSK: no deformities, contusions. Joints are without erythema or swelling.  Skin:  Warm, no rashes or suspicious lesions noted Neurologic:    Mental status is normal. CN 2-11 are normal. Gross motor and sensory exams are normal. Normal gait. No tremor Breast Exam: No mass, skin retraction or nipple discharge is appreciated in either breast. No axillary adenopathy. Fibrocystic changes are not noted    Commons side effects, risks, benefits, and alternatives for medications and treatment plan prescribed today were discussed, and the patient expressed understanding of the given instructions. Patient is instructed to call or message via MyChart if he/she has any  questions or concerns regarding our treatment plan. No barriers to understanding were identified. We discussed Red Flag symptoms and signs in detail. Patient expressed understanding regarding what to do in case of urgent or emergency type symptoms.   Medication list was reconciled, printed and provided to the patient in AVS. Patient instructions and summary information was reviewed with the patient as documented in the AVS. This note was prepared with assistance of Dragon voice recognition software. Occasional wrong-word or  sound-a-like substitutions may have occurred due to the inherent limitations of voice recognition software  This visit occurred during the SARS-CoV-2 public health emergency.  Safety protocols were in place, including screening questions prior to the visit, additional usage of staff PPE, and extensive cleaning of exam room while observing appropriate contact time as indicated for disinfecting solutions.

## 2020-10-29 LAB — CBC WITH DIFFERENTIAL/PLATELET
Absolute Monocytes: 398 cells/uL (ref 200–950)
Basophils Absolute: 38 cells/uL (ref 0–200)
Basophils Relative: 0.8 %
Eosinophils Absolute: 58 cells/uL (ref 15–500)
Eosinophils Relative: 1.2 %
HCT: 40.4 % (ref 35.0–45.0)
Hemoglobin: 13.8 g/dL (ref 11.7–15.5)
Lymphs Abs: 1714 cells/uL (ref 850–3900)
MCH: 30.5 pg (ref 27.0–33.0)
MCHC: 34.2 g/dL (ref 32.0–36.0)
MCV: 89.2 fL (ref 80.0–100.0)
MPV: 9.2 fL (ref 7.5–12.5)
Monocytes Relative: 8.3 %
Neutro Abs: 2592 cells/uL (ref 1500–7800)
Neutrophils Relative %: 54 %
Platelets: 227 10*3/uL (ref 140–400)
RBC: 4.53 10*6/uL (ref 3.80–5.10)
RDW: 13.1 % (ref 11.0–15.0)
Total Lymphocyte: 35.7 %
WBC: 4.8 10*3/uL (ref 3.8–10.8)

## 2020-10-29 LAB — LIPID PANEL
Cholesterol: 244 mg/dL — ABNORMAL HIGH (ref <200)
HDL: 57 mg/dL (ref >=50)
LDL Cholesterol (Calc): 166 mg/dL (calc) — ABNORMAL HIGH
Non-HDL Cholesterol (Calc): 187 mg/dL (calc) — ABNORMAL HIGH (ref <130)
Total CHOL/HDL Ratio: 4.3 (calc) (ref <5.0)
Triglycerides: 98 mg/dL (ref <150)

## 2020-10-29 LAB — COMPLETE METABOLIC PANEL WITH GFR
AG Ratio: 2.1 (calc) (ref 1.0–2.5)
ALT: 12 U/L (ref 6–29)
AST: 15 U/L (ref 10–35)
Albumin: 4.8 g/dL (ref 3.6–5.1)
Alkaline phosphatase (APISO): 103 U/L (ref 37–153)
BUN: 18 mg/dL (ref 7–25)
CO2: 26 mmol/L (ref 20–32)
Calcium: 9.6 mg/dL (ref 8.6–10.4)
Chloride: 104 mmol/L (ref 98–110)
Creat: 0.65 mg/dL (ref 0.50–1.05)
GFR, Est African American: 113 mL/min/{1.73_m2} (ref >=60)
GFR, Est Non African American: 97 mL/min/{1.73_m2} (ref >=60)
Globulin: 2.3 g/dL (calc) (ref 1.9–3.7)
Glucose, Bld: 101 mg/dL — ABNORMAL HIGH (ref 65–99)
Potassium: 4.2 mmol/L (ref 3.5–5.3)
Sodium: 139 mmol/L (ref 135–146)
Total Bilirubin: 0.5 mg/dL (ref 0.2–1.2)
Total Protein: 7.1 g/dL (ref 6.1–8.1)

## 2020-10-29 LAB — VITAMIN D 25 HYDROXY (VIT D DEFICIENCY, FRACTURES): Vit D, 25-Hydroxy: 18 ng/mL — ABNORMAL LOW (ref 30–100)

## 2020-10-29 LAB — TSH: TSH: 2.1 mIU/L (ref 0.40–4.50)

## 2021-05-02 ENCOUNTER — Other Ambulatory Visit: Payer: Self-pay | Admitting: Family Medicine

## 2021-06-05 ENCOUNTER — Telehealth: Payer: Self-pay

## 2021-06-05 NOTE — Telephone Encounter (Signed)
FYI, patient has been scheduled

## 2021-06-05 NOTE — Telephone Encounter (Signed)
Nurse Assessment Nurse: Michell Heinrich, RN, Lanora Manis Date/Time (Eastern Time): 06/05/2021 8:53:13 AM Confirm and document reason for call. If symptomatic, describe symptoms. ---Caller states that she is having pain in her back/ ribs. States left side. States has had that for a couple of weeks. States that it hurts when she moves. States that she is having some blood in urine. States blood started 2 days ago. Does the patient have any new or worsening symptoms? ---Yes Will a triage be completed? ---Yes Related visit to physician within the last 2 weeks? ---No Does the PT have any chronic conditions? (i.e. diabetes, asthma, this includes High risk factors for pregnancy, etc.) ---Yes List chronic conditions. ---hx of kidney injury 2 yrs ago Is this a behavioral health or substance abuse call? ---No Guidelines Guideline Title Affirmed Question Affirmed Notes Nurse Date/Time (Eastern Time) Chest Pain [1] Chest pain lasts < 5 minutes AND Cantrell, RN, Lanora Manis 06/05/2021 8:55:23 AM PLEASE NOTE: All timestamps contained within this report are represented as [place] Standard Time. CONFIDENTIALTY NOTICE: This fax transmission is intended only for the addressee. It contains information that is legally privileged, confidential or otherwise protected from use or disclosure. If you are not the intended recipient, you are strictly prohibited from reviewing, disclosing, copying using or disseminating any of this information or taking any action in reliance on or regarding this information. If you have received this fax in error, please notify us immediately by telephone so that we can arrange for its return to [place]. Phone: [phone], Toll-Free: [phone], Fax: [phone] Page: 2 of 2 Call Id: [id number] Guidelines Guideline Title Affirmed Question Affirmed Notes Nurse Date/Time Lamount Cohen Time) [2] NO chest pain or cardiac symptoms (e.g., breathing difficulty, sweating) now (Exception: chest  pains that last only a few seconds) Urine - Blood In Side (flank) or back pain present Cantrell, RN, Lanora Manis 06/05/2021 9:00:12 AM Disp. Time Lamount Cohen Time) Disposition Final User 06/05/2021 8:51:53 AM Send to Urgent Queue Talmadge Coventry 06/05/2021 8:59:39 AM See PCP within 24 Hours Cantrell, RN, Lanora Manis 06/05/2021 9:01:47 AM See PCP within 24 Hours Yes Cantrell, RN, Heloise Purpura Disagree/Comply Comply Caller Understands Yes PreDisposition Call Doctor Care Advice Given Per Guideline SEE PCP WITHIN 24 HOURS: * IF OFFICE WILL BE OPEN: You need to be examined within the next 24 hours. Call your doctor (or NP/PA) when the office opens and make an appointment. * Difficulty breathing occurs CALL BACK IF: * Chest pain increases in frequency, duration or severity * Chest pain lasts over 5 minutes * You become worse CARE ADVICE given per Chest Pain (Adult) guideline. SEE PCP WITHIN 24 HOURS: * IF OFFICE WILL BE OPEN: You need to be examined within the next 24 hours. Call your doctor (or NP/PA) when the office opens and make an appointment. CALL BACK IF: * Fever occurs * You become worse CARE ADVICE given per Urine, Blood In (Adult) guideline. Comments User: Patrica Duel, RN Date/Time Lamount Cohen Time): 06/05/2021 9:04:40 AM Advised caller to be seen in UC if not able to be seen in office in the next 24 hours. Referrals REFERRED TO PCP OFFIC

## 2021-06-07 ENCOUNTER — Encounter: Payer: Self-pay | Admitting: Family Medicine

## 2021-06-07 ENCOUNTER — Other Ambulatory Visit: Payer: Self-pay

## 2021-06-07 ENCOUNTER — Ambulatory Visit: Payer: 59 | Admitting: Family Medicine

## 2021-06-07 VITALS — BP 140/88 | HR 83 | Temp 98.3°F | Ht 65.0 in | Wt 130.6 lb

## 2021-06-07 DIAGNOSIS — Z8619 Personal history of other infectious and parasitic diseases: Secondary | ICD-10-CM

## 2021-06-07 DIAGNOSIS — N3091 Cystitis, unspecified with hematuria: Secondary | ICD-10-CM

## 2021-06-07 DIAGNOSIS — M5136 Other intervertebral disc degeneration, lumbar region: Secondary | ICD-10-CM

## 2021-06-07 DIAGNOSIS — M545 Low back pain, unspecified: Secondary | ICD-10-CM

## 2021-06-07 DIAGNOSIS — R319 Hematuria, unspecified: Secondary | ICD-10-CM | POA: Diagnosis not present

## 2021-06-07 LAB — CBC WITH DIFFERENTIAL/PLATELET
Basophils Absolute: 0 10*3/uL (ref 0.0–0.1)
Basophils Relative: 0.7 % (ref 0.0–3.0)
Eosinophils Absolute: 0.1 10*3/uL (ref 0.0–0.7)
Eosinophils Relative: 1.2 % (ref 0.0–5.0)
HCT: 39.2 % (ref 36.0–46.0)
Hemoglobin: 13.4 g/dL (ref 12.0–15.0)
Lymphocytes Relative: 33.2 % (ref 12.0–46.0)
Lymphs Abs: 1.9 10*3/uL (ref 0.7–4.0)
MCHC: 34.1 g/dL (ref 30.0–36.0)
MCV: 90.3 fl (ref 78.0–100.0)
Monocytes Absolute: 0.3 10*3/uL (ref 0.1–1.0)
Monocytes Relative: 6.2 % (ref 3.0–12.0)
Neutro Abs: 3.3 10*3/uL (ref 1.4–7.7)
Neutrophils Relative %: 58.7 % (ref 43.0–77.0)
Platelets: 261 10*3/uL (ref 150.0–400.0)
RBC: 4.34 Mil/uL (ref 3.87–5.11)
RDW: 14 % (ref 11.5–15.5)
WBC: 5.6 10*3/uL (ref 4.0–10.5)

## 2021-06-07 LAB — POCT URINALYSIS DIPSTICK
Bilirubin, UA: NEGATIVE
Glucose, UA: NEGATIVE
Ketones, UA: NEGATIVE
Nitrite, UA: POSITIVE
Protein, UA: NEGATIVE
Spec Grav, UA: 1.015 (ref 1.010–1.025)
Urobilinogen, UA: 0.2 E.U./dL
pH, UA: 6 (ref 5.0–8.0)

## 2021-06-07 LAB — BASIC METABOLIC PANEL
BUN: 11 mg/dL (ref 6–23)
CO2: 27 mEq/L (ref 19–32)
Calcium: 9.3 mg/dL (ref 8.4–10.5)
Chloride: 106 mEq/L (ref 96–112)
Creatinine, Ser: 0.64 mg/dL (ref 0.40–1.20)
GFR: 96.57 mL/min (ref >60.00)
Glucose, Bld: 98 mg/dL (ref 70–99)
Potassium: 4 mEq/L (ref 3.5–5.1)
Sodium: 139 mEq/L (ref 135–145)

## 2021-06-07 MED ORDER — CIPROFLOXACIN HCL 500 MG PO TABS: 500.0000 mg | ORAL_TABLET | Freq: Two times a day (BID) | ORAL | 0 refills | Status: AC

## 2021-06-07 NOTE — Progress Notes (Signed)
Subjective  CC:  Chief Complaint  Patient presents with  . Hematuria    Started on sunday  . Back Pain    Started May 21st at the top of her back, has now settled in lower back     HPI: Sarah Fry is a 60 y.o. female who presents to the office today to address the problems listed above in the chief complaint.  60 year old female with history of renal cysts, urosepsis, and nephrolithiasis presents with back pain and 2 days of gross hematuria.  As noted above, she had upper midline and right greater than left back pain that started several weeks ago.  Pain was mild.  She has been gardening and was lifting a 15 pound-year-old child that day but does not recall injuring.  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  She does not need medications for it.  However it persisted for several weeks.  Pain was worse with certain movements like leaning forward or lifting things.  Pain has since moved to the midline low back.  There are no radicular symptoms.  However 2 days ago she had gross hematuria.  This has lessened.  She also describes some irritative symptoms including mild dysuria and urinary urgency.  She is emptying her bladder fine.  Her back pain is not typical of her kidney stones in the past.  She denies fevers, chills, nausea or vomiting.  Bowel movements are normal.  She has no leg symptoms or weakness.   Assessment  1. Hemorrhagic cystitis   2. Hematuria, unspecified type   3. Acute midline low back pain without sciatica   4. 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 History of sepsis, 2/20      Plan   Symptom complex most consistent with 2 separate conditions, musculoskeletal back pain and acute hemorrhagic cystitis.:  Pain is atypical for renal stones.  We will treat with Cipro, await urine cultures and monitor symptoms.  She will use Advil or Tylenol and heat for back pain.  Check BMP and CBC given blood in urine  and history of urosepsis.  Monitor kidney function.  Educated in detail.  She will follow-up if symptoms are not improving.  She will follow-up if back pain is not improving.  We discussed red flag symptoms including fever, nausea vomiting or increased pain or blood in the urine.  Follow up: October for complete physical Visit date not found  Orders Placed This Encounter  Procedures  . Urine Culture  . Basic metabolic panel  . CBC with Differential/Platelet  . POCT Urinalysis Dipstick   Meds ordered this encounter  Medications  . ciprofloxacin (CIPRO) 500 MG tablet    Sig: Take 1 tablet (500 mg total) by mouth 2 (two) times daily for 7 days.    Dispense:  14 tablet    Refill:  0      I reviewed the patients updated PMH, FH, and SocHx.    Patient Active Problem List   Diagnosis Date Noted  . Multiple renal cysts, see CT 02/05/19 02/12/2019    Priority: Medium  . Lumbar degenerative disc disease, with disc height loss L4-5 with bilateral facet arthropathy 02/12/2019    Priority: Medium  . Incidental lung nodule, 4 mm subpleural RLL found on CT 2/20 02/12/2019    Priority: Medium  . Nephrolithiasis 02/12/2019    Priority: Medium  . Former smoker 02/12/2019    Priority: Medium  .  B12 deficiency 08/22/2019    Priority: Low  . Vitamin D deficiency 08/22/2019    Priority: Low  . History of sepsis, 2/20 02/13/2019    Priority: Low  . Vaccination refused by patient 10/28/2020   Current Meds  Medication Sig  . ALPRAZolam (XANAX) 0.5 MG tablet Take 1 tablet (0.5 mg total) by mouth at bedtime as needed for anxiety.  . Ascorbic Acid (VITAMIN C) 1000 MG tablet Take 1,000 mg by mouth daily.  . Cholecalciferol (VITAMIN D3) 125 MCG (5000 UT) CAPS Take by mouth.  . ciprofloxacin (CIPRO) 500 MG tablet Take 1 tablet (500 mg total) by mouth 2 (two) times daily for 7 days.  . Coenzyme Q10 (COQ10) 200 MG CAPS Take by mouth.  . COLLAGEN PO Take 6,000 mg by mouth.  . Turmeric (QC TUMERIC  COMPLEX PO) Take 538 mg by mouth.  . Zinc 30 MG TABS Take by mouth.    Allergies: Patient is allergic to flexeril [cyclobenzaprine] and hydrocodone. Family History: Patient family history includes Alzheimer's disease in her mother; Liver disease in her father. Social History:  Patient  reports that she quit smoking about 2 years ago. Her smoking use included cigarettes. She has a 40.00 pack-year smoking history. She has never used smokeless tobacco. She reports that she does not drink alcohol and does not use drugs.  Review of Systems: Constitutional: Negative for fever malaise or anorexia Cardiovascular: negative for chest pain Respiratory: negative for SOB or persistent cough Gastrointestinal: negative for abdominal pain  Objective  Vitals: BP 140/88   Pulse 83   Temp 98.3 F (36.8 C) (Temporal)   Ht 5\' 5"  (1.651 m)   Wt 130 lb 9.6 oz (59.2 kg)   SpO2 97%   BMI 21.73 kg/m  General: no acute distress , A&Ox3, appears well, nontoxic.  Moves easily. HEENT: PEERL, conjunctiva normal, neck is supple Cardiovascular:  RRR without murmur or gallop.  Respiratory:  Good breath sounds bilaterally, CTAB with normal respiratory effort Gastrointestinal: soft, flat abdomen, normal active bowel sounds, no palpable masses, no hepatosplenomegaly, no appreciated hernias, no CVA tenderness, minimal suprapubic tenderness Back: Mild right lumbar paravertebral tenderness.  No CVA tenderness.  No spinal tenderness.  Normal range of motion.  Negative straight leg raise bilaterally. Skin:  Warm, no rashes  Office Visit on 06/07/2021  Component Date Value Ref Range Status  . Color, UA 06/07/2021 yellow   Final  . Clarity, UA 06/07/2021 cloudy   Final  . Glucose, UA 06/07/2021 Negative  Negative Final  . Bilirubin, UA 06/07/2021 negative   Final  . Ketones, UA 06/07/2021 negative   Final  . Spec Grav, UA 06/07/2021 1.015  1.010 - 1.025 Final  . Blood, UA 06/07/2021 3+   Final  . pH, UA 06/07/2021  6.0  5.0 - 8.0 Final  . Protein, UA 06/07/2021 Negative  Negative Final  . Urobilinogen, UA 06/07/2021 0.2  0.2 or 1.0 E.U./dL Final  . Nitrite, UA 08/07/2021 positive   Final  . Leukocytes, UA 06/07/2021 Moderate (2+)* Negative Final      Commons side effects, risks, benefits, and alternatives for medications and treatment plan prescribed today were discussed, and the patient expressed understanding of the given instructions. Patient is instructed to call or message via MyChart if he/she has any questions or concerns regarding our treatment plan. No barriers to understanding were identified. We discussed Red Flag symptoms and signs in detail. Patient expressed understanding regarding what to do in case of urgent or emergency  type symptoms.   Medication list was reconciled, printed and provided to the patient in AVS. Patient instructions and summary information was reviewed with the patient as documented in the AVS. This note was prepared with assistance of Dragon voice recognition software. Occasional wrong-word or sound-a-like substitutions may have occurred due to the inherent limitations of voice recognition software  This visit occurred during the SARS-CoV-2 public health emergency.  Safety protocols were in place, including screening questions prior to the visit, additional usage of staff PPE, and extensive cleaning of exam room while observing appropriate contact time as indicated for disinfecting solutions.

## 2021-06-07 NOTE — Patient Instructions (Signed)
Please return in October for your complete physical. Sooner if worsening.   Please take the antibiotics for a presumed UTI. Use advil as needed for back pain.   I will release your lab results to you on your MyChart account with further instructions. Please reply with any questions.   If you have any questions or concerns, please don't hesitate to send me a message via MyChart or call the office at [phone]. Thank you for visiting with [place] today! It's our pleasure caring for you.

## 2021-06-09 LAB — URINE CULTURE
MICRO NUMBER:: 11983593
SPECIMEN QUALITY:: ADEQUATE

## 2022-03-28 ENCOUNTER — Other Ambulatory Visit: Payer: Self-pay | Admitting: Family Medicine

## 2022-03-29 NOTE — Telephone Encounter (Signed)
Please call patient. Overdue for visit/cpe. Last may 2021. ? ?I refilled #20.  ?No further refills without visit. Please get her scheduled. Thanks.  ? ?And please remember to put info in for me on these types of refill requests: last OV,last refill.  ?

## 2022-09-24 ENCOUNTER — Encounter: Payer: Self-pay | Admitting: *Deleted

## 2022-12-13 ENCOUNTER — Encounter: Payer: Self-pay | Admitting: *Deleted

## 2023-03-26 ENCOUNTER — Telehealth: Payer: 59 | Admitting: Nurse Practitioner

## 2023-03-26 DIAGNOSIS — W540XXA Bitten by dog, initial encounter: Secondary | ICD-10-CM

## 2023-03-26 DIAGNOSIS — R21 Rash and other nonspecific skin eruption: Secondary | ICD-10-CM

## 2023-03-26 MED ORDER — AMOXICILLIN-POT CLAVULANATE 875-125 MG PO TABS: 1.0000 | ORAL_TABLET | Freq: Two times a day (BID) | ORAL | 0 refills | Status: AC

## 2023-03-26 NOTE — Progress Notes (Signed)
Virtual Visit Consent   Sarah Fry, you are scheduled for a virtual visit with a [facility] provider today. Just as with appointments in the office, your consent must be obtained to participate. Your consent will be active for this visit and any virtual visit you may have with one of our providers in the next 365 days. If you have a MyChart account, a copy of this consent can be sent to you electronically.  As this is a virtual visit, video technology does not allow for your provider to perform a traditional examination. This may limit your provider's ability to fully assess your condition. If your provider identifies any concerns that need to be evaluated in person or the need to arrange testing (such as labs, EKG, etc.), we will make arrangements to do so. Although advances in technology are sophisticated, we cannot ensure that it will always work on either your end or our end. If the connection with a video visit is poor, the visit may have to be switched to a telephone visit. With either a video or telephone visit, we are not always able to ensure that we have a secure connection.  By engaging in this virtual visit, you consent to the provision of healthcare and authorize for your insurance to be billed (if applicable) for the services provided during this visit. Depending on your insurance coverage, you may receive a charge related to this service.  I need to obtain your verbal consent now. Are you willing to proceed with your visit today? Sarah Fry has provided verbal consent on 03/26/2023 for a virtual visit (video or telephone). Apolonio Schneiders, FNP  Date: 03/26/2023 3:56 PM  Virtual Visit via Video Note   I, Apolonio Schneiders, connected with  Sarah Fry  ([id number], 1961-09-04) (62) on 03/26/23 at  4:00 PM EDT by a video-enabled telemedicine application and verified that I am speaking with the correct person using two identifiers.  Location: Patient: Virtual Visit Location  Patient: Home Provider: Virtual Visit Location Provider: Home Office   I discussed the limitations of evaluation and management by telemedicine and the availability of in person appointments. The patient expressed understanding and agreed to proceed.    History of Present Illness: Sarah Fry is a 62 y.o. who identifies as a female who was assigned female at birth, and is being seen today for a dog bite.  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 02/12/2019   Incidental lung nodule, 4 mm subpleural RLL found on CT 2/20 02/12/2019   Nephrolithiasis 02/12/2019   Former smoker 02/12/2019    Allergies:  Allergies  Allergen Reactions   Flexeril [Cyclobenzaprine] Anaphylaxis   Hydrocodone Anaphylaxis   Medications:  Current Outpatient Medications:    ALPRAZolam (XANAX) 0.5 MG tablet, TAKE 1 TABLET(0.5 MG) BY MOUTH AT BEDTIME AS NEEDED FOR ANXIETY, Disp: 20 tablet, Rfl: 0  Ascorbic Acid (VITAMIN C) 1000 MG tablet, Take 1,000 mg by mouth daily., Disp: , Rfl:    Cholecalciferol (VITAMIN D3) 125 MCG (5000 UT) CAPS, Take by mouth., Disp: , Rfl:    Coenzyme Q10 (COQ10) 200 MG CAPS, Take by mouth., Disp: , Rfl:    COLLAGEN PO, Take 6,000 mg by mouth., Disp: , Rfl:    Turmeric (QC TUMERIC COMPLEX PO), Take 538 mg by mouth., Disp: , Rfl:    Zinc 30 MG TABS, Take by mouth., Disp: , Rfl:   Observations/Objective: Patient is well-developed,  well-nourished in no acute distress.  Resting comfortably  at home.  Head is normocephalic, atraumatic.  No labored breathing.  Speech is clear and coherent with logical content.  Patient is alert and oriented at baseline.  Back of left leg with 2 inch linear abrasion with puncture wounds  Controlled bleeding  Assessment and Plan: 1. Dog bite, initial encounter  - amoxicillin-clavulanate (AUGMENTIN) 875-125 MG tablet; Take 1 tablet by mouth 2 (two) times daily.  Dispense: 20 tablet; Refill: 0    Take with food keep wound open and clean as discussed  May cover with loose gauze as needed Monitor for infection   Follow up with PCP or Pharmacy for TD, Rabies as needed once report is back on the health of the dog   Has follow up scheduled with PCP tomorrow    Follow Up Instructions: I discussed the assessment and treatment plan with the patient. The patient was provided an opportunity to ask questions and all were answered. The patient agreed with the plan and demonstrated an understanding of the instructions.  A copy of instructions were sent to the patient via MyChart unless otherwise noted below.    The patient was advised to call back or seek an in-person evaluation if the symptoms worsen or if the condition fails to improve as anticipated.  Time:  I spent 15 minutes with the patient via telehealth technology discussing the above problems/concerns.    Apolonio Schneiders, FNP

## 2023-03-27 ENCOUNTER — Ambulatory Visit: Payer: 59 | Admitting: Family

## 2023-07-24 ENCOUNTER — Encounter: Payer: Self-pay | Admitting: Family Medicine
